# Patient Record
Sex: Male | Born: 1965 | Race: White | Marital: Married | Smoking: Former smoker
Health system: Northeastern US, Academic
[De-identification: ages and names within clinical notes are randomized; demographics above are authoritative.]

## PROBLEM LIST (undated history)

## (undated) DIAGNOSIS — E119 Type 2 diabetes mellitus without complications: Secondary | ICD-10-CM

## (undated) DIAGNOSIS — I10 Essential (primary) hypertension: Secondary | ICD-10-CM

## (undated) DIAGNOSIS — T8859XA Other complications of anesthesia, initial encounter: Secondary | ICD-10-CM

## (undated) DIAGNOSIS — K635 Polyp of colon: Secondary | ICD-10-CM

## (undated) DIAGNOSIS — M1990 Unspecified osteoarthritis, unspecified site: Secondary | ICD-10-CM

## (undated) DIAGNOSIS — R002 Palpitations: Secondary | ICD-10-CM

## (undated) HISTORY — PX: KNEE SURGERY: SHX244

## (undated) HISTORY — PX: JOINT REPLACEMENT: SHX530

## (undated) HISTORY — DX: Palpitations: R00.2

## (undated) HISTORY — PX: TONSILLECTOMY: SHX28A

## (undated) HISTORY — PX: COLONOSCOPY: SHX174

---

## 2007-09-19 DIAGNOSIS — E669 Obesity, unspecified: Secondary | ICD-10-CM | POA: Insufficient documentation

## 2009-10-13 ENCOUNTER — Ambulatory Visit
Admit: 2009-10-13 | Discharge: 2009-10-13 | Disposition: A | Discharge status: Home / Self Care | Payer: Self-pay | Source: Ambulatory Visit | Attending: Sports Medicine | Admitting: Sports Medicine

## 2009-10-18 ENCOUNTER — Ambulatory Visit
Admit: 2009-10-18 | Discharge: 2009-10-18 | Disposition: A | Discharge status: Home / Self Care | Payer: Self-pay | Source: Ambulatory Visit | Attending: Sleep Medicine | Admitting: Sleep Medicine

## 2009-10-24 ENCOUNTER — Ambulatory Visit
Admit: 2009-10-24 | Discharge: 2009-10-24 | Disposition: A | Discharge status: Home / Self Care | Payer: Self-pay | Source: Ambulatory Visit | Attending: Internal Medicine | Admitting: Internal Medicine

## 2009-11-03 ENCOUNTER — Ambulatory Visit: Payer: Self-pay | Admitting: Sports Medicine

## 2009-11-09 ENCOUNTER — Ambulatory Visit
Admit: 2009-11-09 | Discharge: 2009-11-09 | Disposition: A | Discharge status: Home / Self Care | Payer: Self-pay | Source: Ambulatory Visit | Attending: Sports Medicine | Admitting: Sports Medicine

## 2009-11-10 ENCOUNTER — Ambulatory Visit
Admit: 2009-11-10 | Discharge: 2009-11-10 | Disposition: A | Discharge status: Home / Self Care | Payer: Self-pay | Source: Ambulatory Visit | Attending: Sports Medicine | Admitting: Sports Medicine

## 2009-11-10 ENCOUNTER — Encounter: Payer: Self-pay | Admitting: Cardiology

## 2009-11-11 ENCOUNTER — Ambulatory Visit
Admit: 2009-11-11 | Discharge: 2009-11-11 | Disposition: A | Discharge status: Home / Self Care | Payer: Self-pay | Source: Ambulatory Visit | Attending: Sports Medicine | Admitting: Sports Medicine

## 2009-11-17 ENCOUNTER — Ambulatory Visit: Payer: Self-pay | Admitting: Sports Medicine

## 2009-11-28 NOTE — Progress Notes (Signed)
HISTORY OF PRESENTING ILLNESS: Noah Craig is seen in postop follow up from his  right knee arthroscopy with partial meniscectomy and debridement.  He is  recovering well.  He has much less pain than he did preoperatively.  This  is quite encouraging.    PHYSICAL EXAMINATION: On exam he has no effusion and minimal swelling.  He  has extension to 0 and flexion to 120 degrees.  Portals look good. Sutures  were removed.    IMPRESSION: Good initial response.    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  It certainly is encouraging with his initial postop  response but is quite concerning about long-term progression of arthritis.  We discussed ongoing nonoperative measures including the use of  glucosamine, avoiding high-impact loading, and being consistent with  low-impact conditioning and weight control.  I would like to see him back  in a month.  Questions were invited and answered. He is already back at  work.                Electronically Signed and Finalized  by  Kristine Linea, MD 11/28/2009 12:44  ___________________________________________  Kristine Linea, MD      DD:   11/17/2009  DT:   11/18/2009  7:33 A  [id number]  [id number]    cc:   Margy Clarks, MD

## 2009-12-12 ENCOUNTER — Ambulatory Visit: Payer: Self-pay | Admitting: Sports Medicine

## 2009-12-13 NOTE — Progress Notes (Signed)
Alexs is seen in follow-up for his right knee arthroscopy, meniscectomy and  debridement for his grade 4 articular changes.  Overall again the extent of  articular damage to his knees doing reasonably well but definitely still  aches.    PHYSICAL EXAMINATION: On exam he has extension to 0, flexion to 130  degrees.  No effusion.  Good quad recruitment.    IMPRESSION:   Doing reasonably well.    PLAN:   I have encouraged him to continue long-term use of  glucosamine.  I  have reviewed with him very gentle exercise, keeping everything low-impact  and I encouraged an ongoing diet for weight loss, emphasized using  extremely light weights, multiple reps for strength training.   Questions  were invited and answered.    At this point he will follow up on a p.r.n. basis.                Electronically Signed and Finalized  by  Kristine Linea, MD 12/13/2009 08:29  ___________________________________________  Kristine Linea, MD      DD:   12/12/2009  DT:   12/12/2009  4:29 P  [id number]  [id number]    cc:   Margy Clarks, MD

## 2010-05-14 ENCOUNTER — Encounter: Payer: Self-pay | Admitting: Gastroenterology

## 2010-06-27 ENCOUNTER — Ambulatory Visit: Payer: Self-pay | Admitting: Internal Medicine

## 2010-06-27 ENCOUNTER — Ambulatory Visit: Payer: Self-pay | Admitting: Primary Care

## 2010-07-04 ENCOUNTER — Ambulatory Visit: Payer: Self-pay | Admitting: Primary Care

## 2011-01-07 NOTE — Miscellaneous (Unsigned)
 Continuity of Care Record  Created: todo  From: Minta Balsam  From:   From: [employer], EHR [id number]  To: Pennix, Roarke  Purpose: Patient Use;       Problems  Diagnosis: Depression (311)   Diagnosis: Diabetes Mellitus (250.00)   Diagnosis: Hyperlipidemia (272.4)   Diagnosis: Hypoglycemia (251.2)   Diagnosis: Obesity (278.00)   Problem: Rectal Sore With Bleeding  Diagnosis: Visit For: Routine Adult H&amp;P (V70.0)     Alerts  Allergy - Latex-asked/denied   Allergy - No Known Drug Allergy     Medications  Glimepiride 2 MG Tablet; TAKE 1 TABLET DAILY WITH BREAKFAST. ; Rx   MetFORMIN HCl 1000 MG Tablet; TAKE ONE TABLET BY MOUTH TWICE A DAY WITH   MEALS ; Rx   OneTouch Lancets Miscellaneous; UAD Twice daily for diabetes 250 ; Rx   OneTouch Ultra Blue Strip; USE ONE STRIP 4 TIMES DAILY DX: DM 250 ; Rx   OneTouch UltraSmart w/Device Kit; USE AS DIRECTED. ; Rx   OneTouch UltraSoft Lancets Miscellaneous; USE TWICE DAILY AS DIRECTED FOR   DIABETES ; Rx     Immunizations  Tdap (Adacel)   Influenza   Influenza   Pneumo (Pneumovax)   H1N1 Influenza Inj   Influenza (Split)

## 2011-01-09 ENCOUNTER — Encounter: Payer: Self-pay | Admitting: Primary Care

## 2011-01-09 ENCOUNTER — Ambulatory Visit: Payer: Self-pay | Admitting: Primary Care

## 2011-01-09 ENCOUNTER — Ambulatory Visit
Admit: 2011-01-09 | Discharge: 2011-01-09 | Disposition: A | Discharge status: Home / Self Care | Payer: Self-pay | Source: Ambulatory Visit | Attending: Primary Care | Admitting: Primary Care

## 2011-01-09 LAB — COMPREHENSIVE METABOLIC PANEL
ALT: 38 U/L (ref 0–50)
AST: 26 U/L (ref 0–50)
Albumin: 4.7 g/dL (ref 3.5–5.2)
Alk Phos: 92 U/L (ref 40–130)
Anion Gap: 7 (ref 7–16)
Bilirubin,Total: 0.8 mg/dL (ref 0.0–1.2)
CO2: 28 mmol/L (ref 20–28)
Calcium: 9.4 mg/dL (ref 9.0–10.3)
Chloride: 102 mmol/L (ref 96–108)
Creatinine: 1.01 mg/dL (ref 0.67–1.17)
GFR,Black: >59 *
GFR,Caucasian: >59 *
Glucose: 215 mg/dL — ABNORMAL HIGH (ref 74–106)
Lab: 19 mg/dL (ref 6–20)
Potassium: 4.3 mmol/L (ref 3.3–5.1)
Sodium: 137 mmol/L (ref 133–145)
Total Protein: 7.4 g/dL (ref 6.3–7.7)

## 2011-01-09 NOTE — Progress Notes (Signed)
Reason For Visit    FUA/ Previous pt of RP, dm, bw 1/10 also had Hbalc done today not in   results yet.KBozek,MA.  HPI   Diabetes: He takes his medications daily.  He denies any change in her   vision or numbness in the feet.  He does not follow with podiatrist.  He   has seen a ophthalmologist about 1-1/2 years ago.  He denies any   hypoglycemia.     Hyperlipidemia: He took himself off the medications.  He is dieting and   exercising.  He lost 20 pounds.  He denies any chest pain or shortness of   breath.     Obesity: He is dieting and exercising.  He lost pounds.  He denies any   joint pains.     Nonsmoker     Medications and allergies reviewed.  Allergies   Latex-asked/denied  No Known Drug Allergy.  Current Meds   Sertraline HCl 100 MG Tablet;TAKE 2 TABLETS DAILY.; Rx  Aspirin 81 MG Tablet;; RPT  OneTouch UltraSoft Lancets Miscellaneous;USE TWICE DAILY AS DIRECTED FOR   DIABETES; Rx  OneTouch UltraSmart w/Device Kit;USE AS DIRECTED.; Rx  OneTouch Lancets Miscellaneous;UAD Twice daily for diabetes 250; Rx  Vitamin D3 1000 UNIT Capsule;TAKE 1 CAPSULE DAILY; RPT  OneTouch Ultra Blue Strip;USE ONE STRIP 4 TIMES DAILY DX: DM 250; Rx  Glimepiride 2 MG Tablet;TAKE 1/2 TABLET BID **no more until seen**; Rx  MetFORMIN HCl 1000 MG Tablet;TAKE ONE TABLET BY MOUTH TWICE A DAY **no more   refills. needs appt**; Rx  Pravastatin Sodium 20 MG Tablet;TAKE ONE TABLET BY MOUTH AT BEDTIME.; Rx.  Active Problems   Depression (311)  Diabetes Mellitus (250.00)  Hyperlipidemia (272.4)  Hypoglycemia (251.2)  Obesity (278.00)  Rectal Sore With Bleeding  Visit For: Routine Adult HandP (V70.0).  Vital Signs   Recorded by [facility] on 09 Jan 2011 02:28 PM  BP:112/60,   Height: 73 in, Weight: 258 lb, BMI: 34 kg/m2.  Physical Exam   Eye: No pallor or icterus  Neck: No carotid bruit or the lymph node  Heart: S1-S2 normal no murmur detected  Lung: Breath sounds are bilaterally equal  Abdomen: Soft nontender bowel sounds  present  Extremities: No edema  Foot exam: normal sensation and pulsation  Neuro: Nonfocal.  Assessment   Diabetes: he is doing extremely well and has lost 21 pounds.  His   hemoglobin A1c was excellent, awaiting HBA1C from today. continue current   medications. encouraged for annual eye exam. Check urine for   micoralbuminuria.     Hyperlipidemia: he is on pravastatin. he is exercising and losing weight,   repeat lipid profile before next visit     obesity:  encouraged to continue weight loss     F/U in 4 months with blood work.     .  Signature   Electronically signed by: Minta Balsam  M.D.; 01/09/2011 3:53 PM EST.

## 2011-01-10 LAB — HEMOGLOBIN A1C: Hemoglobin A1C: 7.8 % — ABNORMAL HIGH (ref 4.0–6.0)

## 2011-04-05 ENCOUNTER — Ambulatory Visit
Admit: 2011-04-05 | Discharge: 2011-04-05 | Disposition: A | Discharge status: Home / Self Care | Payer: Self-pay | Source: Ambulatory Visit | Attending: Primary Care | Admitting: Primary Care

## 2011-04-06 LAB — HEMOGLOBIN A1C: Hemoglobin A1C: 7.4 % — ABNORMAL HIGH (ref 4.0–6.0)

## 2011-05-11 ENCOUNTER — Ambulatory Visit: Payer: Self-pay | Admitting: Primary Care

## 2011-05-14 ENCOUNTER — Encounter: Payer: Self-pay | Admitting: Primary Care

## 2011-05-14 ENCOUNTER — Ambulatory Visit: Payer: Self-pay | Admitting: Primary Care

## 2011-05-14 DIAGNOSIS — E785 Hyperlipidemia, unspecified: Secondary | ICD-10-CM

## 2011-05-14 DIAGNOSIS — E039 Hypothyroidism, unspecified: Secondary | ICD-10-CM

## 2011-05-14 DIAGNOSIS — E119 Type 2 diabetes mellitus without complications: Secondary | ICD-10-CM | POA: Insufficient documentation

## 2011-05-14 MED ORDER — FLUTICASONE PROPIONATE 50 MCG/ACT NA SUSP *I*
2.0000 | Freq: Every day | NASAL | Status: DC
Start: 2011-05-14 — End: 2011-09-06

## 2011-05-14 NOTE — Progress Notes (Signed)
 Subjective: diabetes     Patient ID: Noah Craig is a 45 y.o. male.    HPI    Diabetes: he is taking his medication daily. He check his sugar at home and the number run in low 100's. No change in vision or numbness in feet. He does not follow with opthalmologist or podiatrist.    Hyperlipidemia: he is not taking any medication. He is following diet and exercise. No muscle ache. No CP or SOB    Obesity: he is dieting, lost some weight.     Allergic rhinitis: he is c/o stuffy nose for 4-5 weeks, no sinus pressure, no ear pain. No cough.     nonsmoker    Patient's medications, allergies, past medical, surgical, social and family histories were reviewed and updated as appropriate.    Review of Systems      per HPI    Objective:   Physical Exam      Gen: NAD  ENT: no sinus tenderness, throat without erythema  Heart: S1S2 normal  Lungs: clear, no crackles  Ext: no edema, feet normal.    Plan:      1 DM: HBA1C above goal, continue meds, repeat HBA1C    2 Hyperlipidemia: Recheck lipid profile.    3 Allergic rhinitis; prescribed flonase, advise to call back in 1 week if no improvement    4 Obesity: encourage diet and exercise to loose wt    5 F/U in 6 months.

## 2011-05-14 NOTE — Patient Instructions (Signed)
 Use nasal spray as directed  Lab work tomorrow after 8 hrs fasting

## 2011-05-15 ENCOUNTER — Ambulatory Visit
Admit: 2011-05-15 | Discharge: 2011-05-15 | Disposition: A | Discharge status: Home / Self Care | Payer: Self-pay | Source: Ambulatory Visit | Attending: Primary Care | Admitting: Primary Care

## 2011-05-15 DIAGNOSIS — E785 Hyperlipidemia, unspecified: Secondary | ICD-10-CM

## 2011-05-15 DIAGNOSIS — E119 Type 2 diabetes mellitus without complications: Secondary | ICD-10-CM

## 2011-05-15 DIAGNOSIS — E039 Hypothyroidism, unspecified: Secondary | ICD-10-CM

## 2011-05-15 LAB — COMPREHENSIVE METABOLIC PANEL
ALT: 51 U/L — ABNORMAL HIGH (ref 0–50)
AST: 27 U/L (ref 0–50)
Albumin: 4.5 g/dL (ref 3.5–5.2)
Alk Phos: 76 U/L (ref 40–130)
Anion Gap: 10 (ref 7–16)
Bilirubin,Total: 0.6 mg/dL (ref 0.0–1.2)
CO2: 24 mmol/L (ref 20–28)
Calcium: 9.4 mg/dL (ref 9.0–10.3)
Chloride: 106 mmol/L (ref 96–108)
Creatinine: 0.97 mg/dL (ref 0.67–1.17)
GFR,Black: >59 *
GFR,Caucasian: >59 *
Glucose: 168 mg/dL — ABNORMAL HIGH (ref 74–106)
Lab: 20 mg/dL (ref 6–20)
Potassium: 4.2 mmol/L (ref 3.3–5.1)
Sodium: 140 mmol/L (ref 133–145)
Total Protein: 7.1 g/dL (ref 6.3–7.7)

## 2011-05-15 LAB — TSH: TSH: 2.59 u[IU]/mL (ref 0.27–4.20)

## 2011-05-15 LAB — LIPID PANEL
Chol/HDL Ratio: 2.7
Cholesterol: 95 mg/dL
HDL: 35 mg/dL
LDL Calculated: 48 mg/dL
Non HDL Cholesterol: 60 mg/dL
Triglycerides: 62 mg/dL

## 2011-05-15 LAB — MICROALBUMIN, URINE, RANDOM
Creatinine,UR: 168 mg/dL (ref 20–300)
Microalb/Creat Ratio: 4.9 mg MA/g CR (ref 0.0–29.9)
Microalbumin,UR: 0.82 mg/dL

## 2011-05-16 LAB — HEMOGLOBIN A1C: Hemoglobin A1C: 6.5 % — ABNORMAL HIGH (ref 4.0–6.0)

## 2011-07-10 ENCOUNTER — Other Ambulatory Visit: Payer: Self-pay | Admitting: Primary Care

## 2011-07-10 MED ORDER — GLUCOSE BLOOD VI STRP *A*
ORAL_STRIP | Status: DC
Start: 2011-07-10 — End: 2011-08-23

## 2011-07-10 MED ORDER — METFORMIN HCL 1000 MG PO TABS *I*
ORAL_TABLET | ORAL | Status: DC
Start: 2011-07-10 — End: 2011-12-11

## 2011-07-10 MED ORDER — GLIMEPIRIDE 2 MG PO TABS *I*
ORAL_TABLET | ORAL | Status: DC
Start: 2011-07-10 — End: 2011-11-12

## 2011-07-10 MED ORDER — ONETOUCH ULTRASOFT LANCETS MISC *A*
Status: DC
Start: 2011-07-10 — End: 2011-08-23

## 2011-08-02 ENCOUNTER — Emergency Department
Admission: EM | Admit: 2011-08-02 | Disposition: A | Discharge status: Home / Self Care | Payer: Self-pay | Source: Ambulatory Visit | Attending: Emergency Medicine | Admitting: Emergency Medicine

## 2011-08-02 ENCOUNTER — Encounter: Payer: Self-pay | Admitting: Emergency Medicine

## 2011-08-02 HISTORY — DX: Type 2 diabetes mellitus without complications: E11.9

## 2011-08-02 LAB — CBC AND DIFFERENTIAL
Baso # K/uL: 0 10*3/uL (ref 0.0–0.1)
Basophil %: 0.2 % (ref 0.2–1.2)
Eos # K/uL: 0.1 10*3/uL (ref 0.0–0.5)
Eosinophil %: 0.9 % (ref 0.8–7.0)
Hematocrit: 43 % (ref 40–51)
Hemoglobin: 15.7 g/dL (ref 13.7–17.5)
Lymph # K/uL: 2.1 10*3/uL (ref 1.3–3.6)
Lymphocyte %: 21.1 % — ABNORMAL LOW (ref 21.8–53.1)
MCV: 86 fL (ref 79–92)
Mono # K/uL: 0.5 10*3/uL (ref 0.3–0.8)
Monocyte %: 5 % — ABNORMAL LOW (ref 5.3–12.2)
Neut # K/uL: 7.1 10*3/uL — ABNORMAL HIGH (ref 1.8–5.4)
Platelets: 178 10*3/uL (ref 150–330)
RBC: 5.1 MIL/uL (ref 4.6–6.1)
RDW: 13.1 % (ref 11.6–14.4)
Seg Neut %: 72.8 % — ABNORMAL HIGH (ref 34.0–67.9)
WBC: 9.7 10*3/uL — ABNORMAL HIGH (ref 4.2–9.1)

## 2011-08-02 LAB — URINALYSIS WITH MICROSCOPIC
Blood,UA: NEGATIVE
Ketones, UA: NEGATIVE
Leuk Esterase,UA: NEGATIVE
Nitrite,UA: NEGATIVE
Protein,UA: NEGATIVE mg/dL
RBC,UA: NONE SEEN /hpf (ref 0–2)
Specific Gravity,UA: >1.06 — ABNORMAL HIGH (ref 1.002–1.030)
WBC,UA: NONE SEEN /hpf (ref 0–5)
pH,UA: 5 (ref 5.0–8.0)

## 2011-08-02 LAB — PLASMA PROF 7 (ED ONLY)
Anion Gap,PL: 11 (ref 7–16)
CO2,Plasma: 23 mmol/L (ref 20–28)
Chloride,Plasma: 107 mmol/L (ref 96–108)
Creatinine: 1.04 mg/dL (ref 0.67–1.17)
GFR,Black: >59 *
GFR,Caucasian: >59 *
Glucose,Plasma: 105 mg/dL — ABNORMAL HIGH (ref 60–99)
Potassium,Plasma: 3.8 mmol/L (ref 3.4–4.7)
Sodium,Plasma: 141 mmol/L (ref 132–146)
UN,Plasma: 14 mg/dL (ref 6–20)

## 2011-08-02 LAB — RUQ PANEL (ED ONLY)
ALT: 44 U/L (ref 0–50)
AST: 27 U/L (ref 0–50)
Albumin: 4.8 g/dL (ref 3.5–5.2)
Alk Phos: 68 U/L (ref 40–130)
Amylase: 72 U/L (ref 28–100)
Bilirubin,Direct: 0.2 mg/dL (ref 0.0–0.3)
Bilirubin,Total: 0.6 mg/dL (ref 0.0–1.2)
Lipase: 24 U/L (ref 13–60)
Total Protein: 7.3 g/dL (ref 6.3–7.7)

## 2011-08-02 LAB — BLOOD BANK HOLD RED

## 2011-08-02 LAB — HOLD BLUE

## 2011-08-02 LAB — BLOOD BANK HOLD LAVENDER

## 2011-08-02 MED ORDER — ONDANSETRON 4 MG PO TBDP *I*
4.0000 mg | ORAL_TABLET | Freq: Once | ORAL | Status: AC
Start: 2011-08-02 — End: 2011-08-02
  Administered 2011-08-02: 4 mg via ORAL
  Filled 2011-08-02: qty 1

## 2011-08-02 MED ORDER — HYDROCODONE-ACETAMINOPHEN 5-500 MG PO TABS
1.0000 | ORAL_TABLET | ORAL | Status: DC | PRN
Start: 2011-08-02 — End: 2011-09-06

## 2011-08-02 MED ORDER — OXYCODONE-ACETAMINOPHEN 5-325 MG PO TABS *I*
2.0000 | ORAL_TABLET | Freq: Once | ORAL | Status: AC
Start: 2011-08-02 — End: 2011-08-02
  Administered 2011-08-02: 2 via ORAL
  Filled 2011-08-02: qty 2

## 2011-08-02 NOTE — ED Notes (Signed)
Pt to CT

## 2011-08-02 NOTE — ED Provider Progress Notes (Signed)
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

## 2011-08-02 NOTE — ED Notes (Addendum)
Struck  To left umbilicus with a bar on a trailer gate 2 hours ago. Reports nausea and pain 2/10. Area slightly erythematous. Hx DM2 Bobbe Medico, RN

## 2011-08-02 NOTE — Discharge Instructions (Addendum)
Discharge Instructions for Patients receiving Contrast Medium    08/02/2011  4:56 PM    INFORMATION  I.V. contrast is eliminated through the kidneys.  Oral and rectal contrasts are not absorbed by the body and will be eliminated through the gastrointestinal tract.  Side effects and allergic reactions to contrast mediums are not common but can occur up to 48 hours after your scan.    INSTRUCTIONS   1. You will need to drink four 8 oz glasses of fluid over the next four hours, unless your medical condition does not allow you to do this.  Please speak with an [facility] nurse if you have any questions or concerns about this.  2. Mild headache may occur following contrast injection.  3. The kidneys excrete the contrast.  Your urine will NOT become discolored.  4. You may experience loose stools/diarrhea for approximately 24 hours after drinking oral contrast.  5. You may experience watery stool immediately after rectal contrast.  6.  If you are a DIABETIC and take Actos Plus Met, Actos Plus Met XR, Avandamet, Foramet, Glucophage, Glucophage XR, Glucovance, Glumetza, Janumet, Janumet XR, Jentadueto, Kombiglyze XR, Metaglip, PrandiMet, Riomet or Metformin, hold this medication for 48 hours (2 days) after your exam.  Check with your doctor for management of your diabetes during this time and before restarting these medications.  Your doctor will have to address the need for kidney function blood tests before restarting your diabetes medication.      7.   There is no evidence that the contrast you have received would be harmful to your             breastfed child.  If you choose, you may pump and discard your breast milk for the            next 24 hours.    Delayed effects from contrast are not common.  However, notify your doctor IMMEDIATELY:    If nausea or vomiting occurs   If any itching, hives, wheezing, or rashes occur   Severe or throbbing headache    Call 911 or go to the nearest Emergency Department if you  have a medical emergency.    If you have questions regarding your procedure, please call [facility] [phone] during business hours Monday - Friday 8am-5pm  to speak to an Sales executive.  After 5pm and weekends, call [phone] and ask to speak with the Radiology resident on call.  The Emergency Department is open 24 hours a day at [facility] and [facility] if emergency treatment is required  I have read and understand the above instructions and I have received a copy.  -------------------------------------------------------------------------------------------------------------    You have been given a prescription for Vicodin- take as prescribed as needed for pain. Do not drink alcohol, drive or take in combination with tylenol    You may also take ibuprofen 600mg  every 6 hours as needed for less severe pain    Do not take metformin x 48 hours, drink plenty of fluids    Rest and ice area    Return to the ED with new, worsening or concerning symptoms

## 2011-08-02 NOTE — ED Notes (Signed)
Writer assuming care. Pt a and Ox4. VSS. Family @ bedside. Will continue to tx and monitor pt per orders

## 2011-08-02 NOTE — ED Notes (Signed)
Pt states he is unable to give a urine sample at this time. Bobbe Medico, RN

## 2011-08-02 NOTE — ED Provider Notes (Addendum)
History     Chief Complaint   Patient presents with   . Abdominal Injury     HPI Comments: 45 year old male with PMH DM who presents to the ED with c/o LUQ abdominal pain.  Patient was pulling on metal bar in attempt to unhinge truck door and bar came back and hit him in LUQ of abdomen.  Incident occurred around 1pm, and he instantly experienced pain and nausea.  Pain worse with movement, palpation and deep breaths. Denies further injury, head trauma, LOC, chest pain, SOB. Has not had to urinate since incident occurred.  Currently with abdominal pain he rates 2/10 and mild nausea.     The history is provided by the patient. No language interpreter was used.     He does not have any associated CP  Past Medical History   Diagnosis Date   . Diabetes mellitus        Past Surgical History   Procedure Date   . Knee surgery        No family history on file.    Social History      reports that he has quit smoking. He does not have any smokeless tobacco history on file. He reports that he drinks alcohol. He reports that he does not use illicit drugs. His sexual activity history not on file.    Living Situation     Questions Responses    Patient lives with Significant Other    Homeless No    Caregiver for other family member     External Services     Employment Employed    Domestic Violence Risk           Review of Systems   Review of Systems   Constitutional: Negative for fever and chills.   HENT: Negative for congestion and rhinorrhea.    Eyes: Negative for visual disturbance.   Respiratory: Negative for cough and shortness of breath.    Cardiovascular: Negative for chest pain, palpitations and leg swelling.   Gastrointestinal: Positive for nausea and abdominal pain. Negative for vomiting, diarrhea and blood in stool.   Genitourinary: Negative for dysuria, flank pain and difficulty urinating.   Musculoskeletal: Negative for back pain and gait problem.   Skin: Positive for rash (erythema LUQ abdomen).   Neurological:  Negative for dizziness, weakness, light-headedness, numbness and headaches.   Psychiatric/Behavioral: Negative for behavioral problems, confusion and agitation.       Physical Exam   BP 122/78  Pulse 78  Temp 36 C (96.8 F)  Resp 16  SpO2 98%    Physical Exam   Nursing note and vitals reviewed.  Constitutional: He is oriented to person, place, and time. He appears well-developed and well-nourished. No distress.   HENT:   Head: Normocephalic and atraumatic.   Right Ear: External ear normal.   Left Ear: External ear normal.   Nose: Nose normal.   Mouth/Throat: Uvula is midline and mucous membranes are normal.   Eyes: EOM are normal. Pupils are equal, round, and reactive to light. No scleral icterus.   Neck: Normal range of motion. Neck supple. No spinous process tenderness and no muscular tenderness present. Normal range of motion present.   Cardiovascular: Normal rate, regular rhythm, normal heart sounds and intact distal pulses.  Exam reveals no gallop and no friction rub.    No murmur heard.  Pulmonary/Chest: Effort normal and breath sounds normal. No respiratory distress. He has no wheezes. He has no rales. He exhibits  no tenderness.        No chest wall crepitus   Abdominal: Soft. Bowel sounds are normal. He exhibits no distension and no mass. There is no splenomegaly or hepatomegaly. There is tenderness in the epigastric area and left upper quadrant. There is no rigidity, no rebound, no guarding and no CVA tenderness.       Musculoskeletal: Normal range of motion. He exhibits no edema.   Neurological: He is alert and oriented to person, place, and time. No cranial nerve deficit.   Skin: Skin is warm and dry. He is not diaphoretic.   Psychiatric: He has a normal mood and affect. His behavior is normal. Judgment and thought content normal.       Medical Decision Making   MDM  Number of Diagnoses or Management Options  Contusion:   Diagnosis management comments: Patient seen by me today, 08/02/2011 at 3:15  PM    Assessment:  45 y.o. male with PMH DM who comes to the ED with LUQ abdominal pain  Differential Diagnosis includes contusion, abdominal wall injury, intra-abdominal injury, anemia  Plan: analgesia  Labs, u/a  CT abdomen/pelvis  Reassess- patient asymptomatic and denies pain and nausea  D/C home  Rx vicodin given prn pain  Rest/ice  Drink plenty of fluids, mold metformin x48 hours  F/u primary MD  Return to ED with new, worsening or concerning symptoms    Supervising physician Dr. [name] was immediately available.      I was also concerned about possible splenic injury  [name], PA  Patient seen by me today, 08/02/2011 at 1700    History:   I reviewed this patient, reviewed the APP note and agree, with edits as above  Exam:   I examined this patient, reviewed the APP note and agree, with edits as above    Decision Making:   I discussed with the documented APP decision making  and agree, with edits as above          Author [name], MD      [name], MD  08/02/11 2039

## 2011-08-02 NOTE — ED Notes (Signed)
Hit LUQ with bar while at work  +n-v  3" red mark to abd  Denies SOB

## 2011-08-09 ENCOUNTER — Ambulatory Visit: Payer: Self-pay | Admitting: Primary Care

## 2011-08-16 ENCOUNTER — Ambulatory Visit
Admit: 2011-08-16 | Discharge: 2011-08-16 | Disposition: A | Discharge status: Home / Self Care | Payer: Self-pay | Source: Ambulatory Visit | Attending: Family | Admitting: Family

## 2011-08-22 ENCOUNTER — Ambulatory Visit: Admit: 2011-08-22 | Payer: Self-pay | Source: Ambulatory Visit | Admitting: Family

## 2011-08-23 ENCOUNTER — Other Ambulatory Visit: Payer: Self-pay | Admitting: Primary Care

## 2011-08-23 MED ORDER — ONETOUCH ULTRASOFT LANCETS MISC *A*
Status: DC
Start: 2011-08-23 — End: 2012-01-14

## 2011-08-23 MED ORDER — GLUCOSE BLOOD VI STRP *A*
ORAL_STRIP | Status: DC
Start: 2011-08-23 — End: 2018-02-25

## 2011-08-23 NOTE — Telephone Encounter (Signed)
Pt is calling requesting test strips 100 per month per new insurance. Directions need to say test 3 to 4 times per day to allow for the 100 strips. Pt is also asking for the same thing for lancets--100 per month and test 3-4 times per day.

## 2011-08-30 ENCOUNTER — Ambulatory Visit
Admit: 2011-08-30 | Discharge: 2011-08-30 | Disposition: A | Discharge status: Home / Self Care | Payer: Self-pay | Source: Ambulatory Visit | Attending: Family | Admitting: Family

## 2011-09-06 ENCOUNTER — Ambulatory Visit: Payer: Self-pay | Admitting: Primary Care

## 2011-09-06 ENCOUNTER — Encounter: Payer: Self-pay | Admitting: Primary Care

## 2011-09-06 ENCOUNTER — Ambulatory Visit
Admit: 2011-09-06 | Discharge: 2011-09-06 | Disposition: A | Discharge status: Home / Self Care | Payer: Self-pay | Source: Ambulatory Visit | Attending: Family | Admitting: Family

## 2011-09-06 ENCOUNTER — Ambulatory Visit
Admit: 2011-09-06 | Discharge: 2011-09-06 | Disposition: A | Discharge status: Home / Self Care | Payer: Self-pay | Source: Ambulatory Visit | Attending: Primary Care | Admitting: Primary Care

## 2011-09-06 VITALS — BP 120/78 | HR 80 | Ht 74.0 in | Wt 275.0 lb

## 2011-09-06 DIAGNOSIS — K5900 Constipation, unspecified: Secondary | ICD-10-CM | POA: Insufficient documentation

## 2011-09-06 NOTE — Progress Notes (Signed)
Subjective: constipation     Patient ID: Noah Craig is a 45 y.o. male    HPI     Constipation: he is here for evaluation. C/O constipation on and off but worse for last 6 days. It is associated with abdominal pain which is 5/10 and dull. No radiation of pain. He did not take any medication for constipation. No N&V. No distention. He denies any change in medication.    nonsmoker    Patient's medications, allergies, past medical, surgical, social and family histories were reviewed and updated as appropriate.    Review of Systems      per HPI    Objective:   Physical Exam      abdomen: soft, NT, BS+     Plan:      Constipation; likely acute on chronic, pt was on pain medication due to recent injury. Will obtain Xray of abdomen to evaluate for stool content. Advise high fibre diet. Further management based on Xray finding. Pt reassured.

## 2011-09-07 ENCOUNTER — Telehealth: Payer: Self-pay | Admitting: Primary Care

## 2011-09-07 NOTE — Telephone Encounter (Signed)
Message copied by Duane Lope on Fri Sep 07, 2011  4:24 PM  ------       Message from: Minta Balsam       Created: Fri Sep 07, 2011  1:32 PM         Reviewed Xray, left message to call back. Let him know that it showed lot of stool in colon. Advise Senna 2 tab at night, colace 100 mg capsule 2 at night and magnesium citrate 150 ml daily for next 3 days. All these are available OTC. Call back on Monday as how is he doing. thanks

## 2011-09-07 NOTE — Telephone Encounter (Signed)
Dr. Noah Charon spoke to patient.  All set.

## 2011-09-10 ENCOUNTER — Telehealth: Payer: Self-pay | Admitting: Primary Care

## 2011-09-10 NOTE — Telephone Encounter (Signed)
Seen last week. Advised to call today.  Took magnesium sitrate (purchased 10 oz bottle).  Took 5 oz, which did nothing.  Took the rest later & started to work.  Do you want him to do 5 oz or the full bottle?  How many day should he do this?  (never did the colon cleansing this wk end.  Will do tonight)

## 2011-09-10 NOTE — Telephone Encounter (Signed)
Called patient.  He will do.

## 2011-09-10 NOTE — Telephone Encounter (Signed)
Take 10 Oz daily for 3 days and call back to make sure it is all cleaned out.

## 2011-09-14 ENCOUNTER — Telehealth: Payer: Self-pay | Admitting: Primary Care

## 2011-09-14 NOTE — Telephone Encounter (Signed)
Advised to call today on progress.  He's tired, was taking magnesium titrate, which causes diarrhea, but "didn't really clean him out".  Feels it was not effective.  What do you suggest?     Was hit in the abd with a piece of equipment at work 8/2nd.  Do you feel this could be the issue?  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  Please advise.

## 2011-09-14 NOTE — Telephone Encounter (Signed)
Talked to patient on the phone, advise continue colace and senna. Repeat Xray on Monday and go from there.

## 2011-09-17 ENCOUNTER — Ambulatory Visit
Admit: 2011-09-17 | Discharge: 2011-09-17 | Disposition: A | Discharge status: Home / Self Care | Payer: Self-pay | Source: Ambulatory Visit | Attending: Primary Care | Admitting: Primary Care

## 2011-09-17 DIAGNOSIS — K5900 Constipation, unspecified: Secondary | ICD-10-CM

## 2011-09-18 ENCOUNTER — Telehealth: Payer: Self-pay | Admitting: Primary Care

## 2011-09-18 NOTE — Telephone Encounter (Signed)
Message copied by Duane Lope on Tue Sep 18, 2011 10:27 AM  ------       Message from: Minta Balsam       Created: Tue Sep 18, 2011  9:28 AM         Xray showed still some stool, better than before. Advise to continue colace and Senna. Start Miralax 17 gm daily, he can get that over the counter. Advise him to follow up with me in 1 week. thanks

## 2011-09-18 NOTE — Telephone Encounter (Signed)
Spoke to patient and scheduled him for Monday

## 2011-09-24 ENCOUNTER — Encounter: Payer: Self-pay | Admitting: Primary Care

## 2011-09-24 ENCOUNTER — Ambulatory Visit: Payer: Self-pay | Admitting: Primary Care

## 2011-09-24 VITALS — BP 130/70 | HR 78 | Ht 74.0 in | Wt 275.0 lb

## 2011-09-24 DIAGNOSIS — E119 Type 2 diabetes mellitus without complications: Secondary | ICD-10-CM

## 2011-09-24 DIAGNOSIS — Z23 Encounter for immunization: Secondary | ICD-10-CM

## 2011-09-24 DIAGNOSIS — K5900 Constipation, unspecified: Secondary | ICD-10-CM

## 2011-09-24 NOTE — Progress Notes (Signed)
Subjective: constipation     Patient ID: Noah Craig is a 45 y.o. male    HPI     Constipation: he is here for follow up.  He is taking his medications daily.  He is moving formed bowel movements every day.  He feel much improved.  Denies any nausea vomiting.  No abdominal cramping.      nonsmoker    Patient's medications, allergies, past medical, surgical, social and family histories were reviewed and updated as appropriate.    Review of Systems      per HPI    Objective:   Physical Exam      abdomen: soft, NT, BS+     Plan:      Constipation; improving, free air series reviewed with the patient with improvement in stool content in his colon.  Patient is advised to continue senna, Colace and MiraLAX for other week.  He is advised to stop Colace followed by MiraLAX and followed by senna if no recurrence of constipation.  He is advised to start taking fiber supplements every day.  Patient is advised to call if there is any recurrence of constipation.

## 2011-10-03 ENCOUNTER — Ambulatory Visit
Admit: 2011-10-03 | Discharge: 2011-10-03 | Disposition: A | Discharge status: Home / Self Care | Payer: Self-pay | Source: Ambulatory Visit | Attending: Primary Care | Admitting: Primary Care

## 2011-10-03 DIAGNOSIS — E119 Type 2 diabetes mellitus without complications: Secondary | ICD-10-CM

## 2011-10-04 LAB — HEMOGLOBIN A1C: Hemoglobin A1C: 6.3 % — ABNORMAL HIGH (ref 4.0–6.0)

## 2011-10-08 ENCOUNTER — Ambulatory Visit
Admit: 2011-10-08 | Discharge: 2011-10-08 | Disposition: A | Discharge status: Home / Self Care | Payer: Self-pay | Source: Ambulatory Visit | Attending: Family | Admitting: Family

## 2011-10-10 ENCOUNTER — Encounter: Payer: Self-pay | Admitting: Primary Care

## 2011-10-10 ENCOUNTER — Ambulatory Visit: Payer: Self-pay | Admitting: Primary Care

## 2011-10-10 VITALS — BP 140/80 | HR 80 | Ht 73.0 in | Wt 274.0 lb

## 2011-10-10 DIAGNOSIS — E119 Type 2 diabetes mellitus without complications: Secondary | ICD-10-CM

## 2011-10-10 NOTE — Progress Notes (Signed)
Subjective: diabetes     Patient ID: Samrath Pelt is a 45 y.o. male.    HPI    Diabetes; he is here for follow up. 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 He is taking his medication. No hypoglycemia. No change in vision or numbness in feet.    Non smoker    Patient's medications, allergies, past medical, surgical, social and family histories were reviewed and updated as appropriate.    Review of Systems      per hPI    Objective:   Physical Exam     Gen: NAD  Heart: S1S2 normal  Lungs: clear  Ext; no edema    Plan:      Diabetes: well controlled, her HBA1C is at goal of 6.3. Pt is reassured that his diabetes is extremely well controlled and no value of checking urine for glucose. He is going to talk to DOT.    F/U in 4 months.

## 2011-10-15 ENCOUNTER — Telehealth: Payer: Self-pay | Admitting: Primary Care

## 2011-10-15 NOTE — Telephone Encounter (Signed)
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 He saw you last week for office visit with lab results and you told him is sugars were under control. Pt needs a letter from you stating that his sugars are under control. Please fax to [phone] attn:Beth, office at [facility] where they do DOT physicals. Pt needs this to get his license, so please do as soon as possible.

## 2011-10-15 NOTE — Telephone Encounter (Signed)
Letter typed, please review and send.  Thanks.

## 2011-10-15 NOTE — Telephone Encounter (Signed)
Trish, Please write letter as discussed. Thanks so much.

## 2011-10-16 NOTE — Telephone Encounter (Signed)
Trish, Is it all set

## 2011-10-16 NOTE — Telephone Encounter (Signed)
DONE and faxed.  td

## 2011-10-21 LAB — HM COLONOSCOPY

## 2011-11-07 ENCOUNTER — Encounter: Payer: Self-pay | Admitting: Primary Care

## 2011-11-07 ENCOUNTER — Ambulatory Visit: Payer: Self-pay | Admitting: Primary Care

## 2011-11-07 VITALS — BP 130/90 | HR 88 | Ht 73.0 in | Wt 277.0 lb

## 2011-11-07 DIAGNOSIS — E119 Type 2 diabetes mellitus without complications: Secondary | ICD-10-CM

## 2011-11-07 DIAGNOSIS — R5383 Other fatigue: Secondary | ICD-10-CM

## 2011-11-07 LAB — URINALYSIS WITH MICROSCOPIC
Blood,UA: NEGATIVE
Glucose,UA: >=500 mg/dL
Ketones, UA: NEGATIVE
Leuk Esterase,UA: NEGATIVE
Nitrite,UA: NEGATIVE
Protein,UA: NEGATIVE mg/dL
RBC,UA: <1 /hpf (ref 0–2)
Specific Gravity,UA: 1.026 (ref 1.002–1.030)
WBC,UA: NONE SEEN /hpf (ref 0–5)
pH,UA: 6 (ref 5.0–8.0)

## 2011-11-07 LAB — COMPREHENSIVE METABOLIC PANEL
ALT: 72 U/L — ABNORMAL HIGH (ref 0–50)
AST: 29 U/L (ref 0–50)
Albumin: 4.9 g/dL (ref 3.5–5.2)
Alk Phos: 94 U/L (ref 40–130)
Anion Gap: 10 (ref 7–16)
Bilirubin,Total: 0.6 mg/dL (ref 0.0–1.2)
CO2: 28 mmol/L (ref 20–28)
Calcium: 9.7 mg/dL (ref 9.0–10.3)
Chloride: 99 mmol/L (ref 96–108)
Creatinine: 1.03 mg/dL (ref 0.67–1.17)
GFR,Black: >59 *
GFR,Caucasian: >59 *
Glucose: 249 mg/dL — ABNORMAL HIGH (ref 60–99)
Lab: 17 mg/dL (ref 6–20)
Potassium: 4.3 mmol/L (ref 3.3–5.1)
Sodium: 137 mmol/L (ref 133–145)
Total Protein: 7.9 g/dL — ABNORMAL HIGH (ref 6.3–7.7)

## 2011-11-07 LAB — CBC
Hematocrit: 47 % (ref 40–51)
Hemoglobin: 17.3 g/dL (ref 13.7–17.5)
MCV: 86 fL (ref 79–92)
Platelets: 189 10*3/uL (ref 150–330)
RBC: 5.5 MIL/uL (ref 4.6–6.1)
RDW: 13 % (ref 11.6–14.4)
WBC: 8.8 10*3/uL (ref 4.2–9.1)

## 2011-11-07 LAB — TSH: TSH: 3.25 u[IU]/mL (ref 0.27–4.20)

## 2011-11-07 LAB — T4, FREE: Free T4: 1.1 ng/dL (ref 0.9–1.7)

## 2011-11-07 MED ORDER — DOXYCYCLINE HYCLATE 100 MG PO CAPS *I*
100.0000 mg | ORAL_CAPSULE | Freq: Two times a day (BID) | ORAL | Status: DC
Start: 2011-11-07 — End: 2011-12-19

## 2011-11-07 NOTE — Progress Notes (Signed)
Subjective: fatigue     Patient ID: Noah Craig is a 45 y.o. male.    HPI: He is accompanied by his fiancee, Fannie Knee    Fatigue: He is here for evaluation.  He complain of some increasing fatigue since Sunday afternoon.  it is sudden in onset and progressive.  There is no aggravating or relieving factors.  It is associated with sudden increase in his blood sugar.  He denies any fever, cold, chest pain or shortness of breath.  No cough.  No nausea vomiting diarrhea.  No muscle body aches.  He is taking his medications daily.  He is following his diet.  He complain of some rash in his groin.  He think he is feeling well better today.    Groin rash: Has been going on for a few days and it is getting worse.  He denies any itching or pain.    Nonsmoker     Patient's medications, allergies, past medical, surgical, social and family histories were reviewed and updated as appropriate.    Review of Systems       per HPI     Objective:   Physical Exam      Eye: No pallor or icterus  Heart: S1-S2 normal no murmur detected  Lung: Breath sounds are bilaterally equal  Abdomen: Soft nontender bowel sounds present  Extremities: No edema  Groin: likely folliculitis   Neuro: Nonfocal     Plan:      Fatigue: Associated with uncontrolled hyperglycemia.  Likely source is some kind of viral syndrome.  Cannot rule out folliculitis in groin area.  Patient is advised to continue his diabetes medications.  We'll start him on doxycycline for folliculitis.  We will check his CBC, SMA 8, liver function tests, TSH and urine test to rule out any underlying infection or diabetic complications.  Further workup based on lab results.  Patient is advised to call back if continued to have elevated sugars.    Patient is advised to stay off work for next 2 days and maybe return to work on Monday feeling better.    Followup as scheduled

## 2011-11-08 LAB — MICROALBUMIN, URINE, RANDOM
Creatinine,UR: 63 mg/dL (ref 20–300)
Microalb/Creat Ratio: 11.1 mg MA/g CR (ref 0.0–29.9)
Microalbumin,UR: 0.7 mg/dL

## 2011-11-08 LAB — HEMOGLOBIN A1C: Hemoglobin A1C: 6.7 % — ABNORMAL HIGH (ref 4.0–6.0)

## 2011-11-09 ENCOUNTER — Telehealth: Payer: Self-pay | Admitting: Primary Care

## 2011-11-09 NOTE — Telephone Encounter (Signed)
Message copied by Duane Lope on Fri Nov 09, 2011 10:49 AM  ------       Message from: Minta Balsam       Created: Thu Nov 08, 2011 11:37 PM         Let him know that [facility] is 6.7 but other labs and urine test are all normal. How is feeling. thanks

## 2011-11-09 NOTE — Telephone Encounter (Signed)
Pt called back --  BG this am was 165, & 2hrs after he ate this afternoon BG 373.  Feels sluggish, fatigued.

## 2011-11-09 NOTE — Telephone Encounter (Signed)
Talked to patient, advise to take glimepiride 2 mg BID for next 2- 3 days. Advise to call back on Monday or call earlier if there is any problem and call answering service. Reassured that labs look ok and hopefully he will feel better soon and likely viral syndrome.

## 2011-11-09 NOTE — Telephone Encounter (Signed)
I spoke to patient, he still doesn't feel good, very tired, fatigued.  Wakes up in the am 160-170 fasting BG,  As soon as he eats it goes up over 300 2 hours after he eats.

## 2011-11-12 ENCOUNTER — Telehealth: Payer: Self-pay | Admitting: Primary Care

## 2011-11-12 ENCOUNTER — Encounter: Payer: Self-pay | Admitting: Primary Care

## 2011-11-12 DIAGNOSIS — R739 Hyperglycemia, unspecified: Secondary | ICD-10-CM

## 2011-11-12 MED ORDER — GLIPIZIDE 10 MG PO TABS *I*
10.0000 mg | ORAL_TABLET | Freq: Two times a day (BID) | ORAL | Status: DC
Start: 2011-11-12 — End: 2012-01-14

## 2011-11-12 NOTE — Telephone Encounter (Signed)
Talked to him on phone, still feel weak. No fever, cough, CP, SOB, abdominal pain. He is eating ok. Advise to stop glimepiride and start glipizide 10 mg BID. Check labs on Wednesday, order sent and follow up on Thursday. He is unable to work, please write a letter saying unable to work due to medical condition until further notice. thanks

## 2011-11-12 NOTE — Telephone Encounter (Signed)
Letter typed, just need to call and find out employer fax#.

## 2011-11-12 NOTE — Telephone Encounter (Signed)
His BG this am at 8:30 am fasting was 197.  Not feeling great, tired, can't explain it.  He said no improvement over the weekend at all.

## 2011-11-13 ENCOUNTER — Ambulatory Visit: Payer: Self-pay | Admitting: Primary Care

## 2011-11-13 ENCOUNTER — Telehealth: Payer: Self-pay | Admitting: Primary Care

## 2011-11-13 NOTE — Telephone Encounter (Signed)
Spoke to patient and per Dr. Noah Charon, scheduled appt. For him to be seen here on Thursday 11/15/11.  He is having labs done tomorrow.

## 2011-11-13 NOTE — Telephone Encounter (Signed)
Patient stopped in and left a note about his bg's this am.  He was fasting at 9am and it was 183.  After breakfast he was 326 at 11:45am.

## 2011-11-13 NOTE — Telephone Encounter (Signed)
Spoke to Cully, he will come pick up letter.  Work does not have a fax.  Letter placed up front

## 2011-11-14 ENCOUNTER — Ambulatory Visit
Admit: 2011-11-14 | Discharge: 2011-11-14 | Disposition: A | Discharge status: Home / Self Care | Payer: Self-pay | Source: Ambulatory Visit | Attending: Primary Care | Admitting: Primary Care

## 2011-11-14 DIAGNOSIS — R739 Hyperglycemia, unspecified: Secondary | ICD-10-CM

## 2011-11-14 LAB — COMPREHENSIVE METABOLIC PANEL
ALT: 68 U/L — ABNORMAL HIGH (ref 0–50)
AST: 34 U/L (ref 0–50)
Albumin: 4.9 g/dL (ref 3.5–5.2)
Alk Phos: 81 U/L (ref 40–130)
Anion Gap: 11 (ref 7–16)
Bilirubin,Total: 0.7 mg/dL (ref 0.0–1.2)
CO2: 28 mmol/L (ref 20–28)
Calcium: 10 mg/dL (ref 9.0–10.3)
Chloride: 101 mmol/L (ref 96–108)
Creatinine: 1.1 mg/dL (ref 0.67–1.17)
GFR,Black: >59 *
GFR,Caucasian: >59 *
Glucose: 180 mg/dL — ABNORMAL HIGH (ref 60–99)
Lab: 19 mg/dL (ref 6–20)
Potassium: 4.4 mmol/L (ref 3.3–5.1)
Sodium: 140 mmol/L (ref 133–145)
Total Protein: 7.7 g/dL (ref 6.3–7.7)

## 2011-11-14 LAB — CBC AND DIFFERENTIAL
Baso # K/uL: 0 10*3/uL (ref 0.0–0.1)
Basophil %: 0.2 % (ref 0.2–1.2)
Eos # K/uL: 0.2 10*3/uL (ref 0.0–0.5)
Eosinophil %: 1.8 % (ref 0.8–7.0)
Hematocrit: 46 % (ref 40–51)
Hemoglobin: 16.7 g/dL (ref 13.7–17.5)
Lymph # K/uL: 2.3 10*3/uL (ref 1.3–3.6)
Lymphocyte %: 28 % (ref 21.8–53.1)
MCV: 86 fL (ref 79–92)
Mono # K/uL: 0.6 10*3/uL (ref 0.3–0.8)
Monocyte %: 7.6 % (ref 5.3–12.2)
Neut # K/uL: 5.2 10*3/uL (ref 1.8–5.4)
Platelets: 192 10*3/uL (ref 150–330)
RBC: 5.4 MIL/uL (ref 4.6–6.1)
RDW: 13 % (ref 11.6–14.4)
Seg Neut %: 62.4 % (ref 34.0–67.9)
WBC: 8.3 10*3/uL (ref 4.2–9.1)

## 2011-11-15 ENCOUNTER — Encounter: Payer: Self-pay | Admitting: Primary Care

## 2011-11-15 ENCOUNTER — Other Ambulatory Visit: Payer: Self-pay | Admitting: Primary Care

## 2011-11-15 ENCOUNTER — Ambulatory Visit: Payer: Self-pay | Admitting: Primary Care

## 2011-11-15 VITALS — BP 120/64 | HR 76 | Ht 73.0 in | Wt 279.0 lb

## 2011-11-15 DIAGNOSIS — IMO0002 Reserved for concepts with insufficient information to code with codable children: Secondary | ICD-10-CM

## 2011-11-15 MED ORDER — ONETOUCH ULTRA 2 W/DEVICE KIT *A*
PACK | Status: DC
Start: 2011-11-15 — End: 2012-10-21

## 2011-11-15 MED ORDER — GLUCOSE BLOOD VI STRP *A*
ORAL_STRIP | Status: DC
Start: 2011-11-15 — End: 2012-11-18

## 2011-11-15 MED ORDER — INSULIN PEN NEEDLE 31G X 8 MM MISC *I*
Status: DC
Start: 2011-11-15 — End: 2012-02-20

## 2011-11-15 MED ORDER — INSULIN GLARGINE 100 UNIT/ML SC SOLN PEN *A*
10.0000 [IU] | Freq: Every evening | SUBCUTANEOUS | Status: DC
Start: 2011-11-15 — End: 2011-11-16

## 2011-11-15 NOTE — Progress Notes (Signed)
Subjective: uncontrolled DM     Patient ID: Noah Craig is a 45 y.o. male.    HPI: He is accompanied by his fiancee, Fannie Knee    Uncontrolled DM: he is taking his medication daily. He continue to complaint of severe fatigue and uncontrolled High sugars. He C/O blurred vision when his sugars are high. No tingling or numbness in feet. No CP or SOB. He feel like exhausted and unable to work.     Groin rash: Has been improving, he is finishing his antibiotics.    Nonsmoker     Patient's medications, allergies, past medical, surgical, social and family histories were reviewed and updated as appropriate.    Review of Systems       per HPI     Objective:   Physical Exam     Eye: No pallor or icterus  Heart: S1-S2 normal no murmur detected  Lung: Breath sounds are bilaterally equal  Abdomen: Soft nontender bowel sounds present  Extremities: No edema  Neuro: Nonfocal     Plan:      Uncontrolled DM: his sugars continue to be very high, he continue to feel exhausted. He is on maximal medical treatment. I don't think it is going to improve his sugars. Discussed multiple options. Best option at this time is to start nocturnal basal insulin with lantus 10 units at night. Advise to call back in 3 days with fasting sugars. Continue current medication. Pt is in hard job and it will be hard for him to work at this time with new insulin and variable sugars, work excuse given and pt to go on temporary disability until his sugars are better controlled. Diabetic and insulin teaching done. Pt met with case manager in office and watched the video. Letter given to excuse from work.    Follow up in 1 month.    Spent 30 min with patient, face to face. More than 50% time spent in counseling and education.

## 2011-11-15 NOTE — Progress Notes (Signed)
I met with Tawanna Cooler and his Bayard Hugger after his appointment with Dr Noah Charon today. He was in need of a new meter so we reviewed the One Touch Ultra 2 and he was able to demonstrate the correct way to use the meter. Prescriptions were sent to his pharmacy. He is going to start Lantus tomorrow evening and we discussed details about Lantus and how to administer it. He was given the Lantus administration sheet as well as the website to view at home. I demonstrated with the sample pen how to attach the needle, prime the pen and dial the correct dose along with where  And how to administer it. A successful return demonstration was done. We spent a lot of time discussing diabetes and the role of life style changes. He states his blood sugars have "been all over the place for the past 3 weeks" and complains of just not feeling well "even though my lab work is normal". He also stated Dr Noah Charon felt that the Lantus will most likely be used shortterm until they can get his sugar back under control. He admitted to an abdominal injury at work in August and has not felt well since then. He also stated he had other stressors in his life that he hopes will be gone by the end of the year but did not want to elaborate any further. He appears tense, anxious and slightly depressed. He has met with a CDE in the past and had been followed by an endocrinologist in the past as well. Both he and Fannie Knee are very knowledgeable about diabetes and Sue's daughter is a RD and has been helpful to them. I will check in next week with him to se how he is doing and to get his readings to Dr Noah Charon.

## 2011-11-16 ENCOUNTER — Telehealth: Payer: Self-pay | Admitting: Primary Care

## 2011-11-16 MED ORDER — INSULIN GLARGINE 100 UNIT/ML SC SOLN PEN *A*
10.0000 [IU] | Freq: Every evening | SUBCUTANEOUS | Status: DC
Start: 2011-11-16 — End: 2011-12-19

## 2011-11-16 NOTE — Telephone Encounter (Signed)
Pt takes 10 units.  Given 5 pens.. Per insurance they will only fill 90 day supply... The way rx is written, it will last 150 days.  Each pen = 3 ml.   --pt needs this tonight, we need to call pharm-

## 2011-11-16 NOTE — Telephone Encounter (Signed)
Called in another prescription and also talked to pharmacy.

## 2011-11-19 ENCOUNTER — Telehealth: Payer: Self-pay | Admitting: Primary Care

## 2011-11-19 NOTE — Telephone Encounter (Signed)
I left a message for him to call back with an update on how he is doing with giving himself the Lantus injection. I will get his BG readings and answer any questions he may have at that time as well.

## 2011-11-20 ENCOUNTER — Telehealth: Payer: Self-pay | Admitting: Primary Care

## 2011-11-20 NOTE — Telephone Encounter (Signed)
Started his insulin on Saturday, 11/17/11  BG readings for 11/18/11 ~  Am before breakfast he was 152,  12:00pm before lunch he was 158,  5:00pm before dinner he was 123, and 9pm he was 197.  11/19/11~  Before breakfast 155,  Before lunch he was 243,  Before dinner he was 184 and 9pm he was 167.  11/20/11, before breakfast he was 133,  2 hours after breakfast he was 298.   He did state on Saturday, he hunted all day and did not eat too much at all, his numbers were lower.

## 2011-11-21 NOTE — Telephone Encounter (Signed)
Increase lantus to 15 units at night and call back on Monday. thanks

## 2011-11-21 NOTE — Telephone Encounter (Signed)
Spoke with patient who states he is doing well with the injections and does not have any questions at this time. He phoned in his glucose readings and is awaiting a call back from Dr Noah Charon. He was told that I remain available to him if needed.

## 2011-11-21 NOTE — Telephone Encounter (Signed)
Pt informed.  WCB on Mon.

## 2011-11-26 NOTE — Telephone Encounter (Signed)
Advised to call with readings:  11/22nd:  Before breakfast:   171        Before  lunch:      285        After dinner:    265  11/23rd:               134           Not done                    Before dinner:  194 & after:  201            11/24th:                                     135       Before lunch        285       before               276  11/25th                                      158         After lnch            122     Before dinner   164  & after   180  11/26:                                         154        after lunch           357   & after lunch  263

## 2011-11-27 NOTE — Telephone Encounter (Signed)
Spoke to [employer]. All set

## 2011-11-27 NOTE — Telephone Encounter (Signed)
Overall the number are improving, increase lantus to 18 units and call back on Monday. thanks

## 2011-12-07 ENCOUNTER — Telehealth: Payer: Self-pay | Admitting: Primary Care

## 2011-12-11 ENCOUNTER — Other Ambulatory Visit: Payer: Self-pay | Admitting: Primary Care

## 2011-12-11 MED ORDER — METFORMIN HCL 1000 MG PO TABS *I*
ORAL_TABLET | ORAL | Status: DC
Start: 2011-12-11 — End: 2012-04-28

## 2011-12-18 ENCOUNTER — Ambulatory Visit: Payer: Self-pay | Admitting: Primary Care

## 2011-12-19 ENCOUNTER — Encounter: Payer: Self-pay | Admitting: Primary Care

## 2011-12-19 ENCOUNTER — Ambulatory Visit: Payer: Self-pay | Admitting: Primary Care

## 2011-12-19 VITALS — BP 120/80 | HR 94 | Ht 73.0 in | Wt 287.0 lb

## 2011-12-19 DIAGNOSIS — E119 Type 2 diabetes mellitus without complications: Secondary | ICD-10-CM

## 2012-01-14 ENCOUNTER — Other Ambulatory Visit: Payer: Self-pay | Admitting: Primary Care

## 2012-01-14 MED ORDER — GLIPIZIDE 10 MG PO TABS *I*
10.0000 mg | ORAL_TABLET | Freq: Two times a day (BID) | ORAL | Status: DC
Start: 2012-01-14 — End: 2012-03-21

## 2012-01-14 MED ORDER — ONETOUCH ULTRASOFT LANCETS MISC *A*
Status: DC
Start: 2012-01-14 — End: 2012-12-25

## 2012-01-28 ENCOUNTER — Ambulatory Visit: Payer: Self-pay | Admitting: Primary Care

## 2012-01-28 ENCOUNTER — Encounter: Payer: Self-pay | Admitting: Primary Care

## 2012-01-28 VITALS — BP 130/80 | HR 84 | Ht 73.0 in | Wt 289.0 lb

## 2012-01-28 DIAGNOSIS — E119 Type 2 diabetes mellitus without complications: Secondary | ICD-10-CM

## 2012-01-28 LAB — COMPREHENSIVE METABOLIC PANEL
ALT: 60 U/L — ABNORMAL HIGH (ref 0–50)
AST: 29 U/L (ref 0–50)
Albumin: 4.5 g/dL (ref 3.5–5.2)
Alk Phos: 67 U/L (ref 40–130)
Anion Gap: 9 (ref 7–16)
Bilirubin,Total: 0.6 mg/dL (ref 0.0–1.2)
CO2: 26 mmol/L (ref 20–28)
Calcium: 9.1 mg/dL (ref 9.0–10.3)
Chloride: 108 mmol/L (ref 96–108)
Creatinine: 0.96 mg/dL (ref 0.67–1.17)
GFR,Black: >59 *
GFR,Caucasian: >59 *
Glucose: 130 mg/dL — ABNORMAL HIGH (ref 60–99)
Lab: 16 mg/dL (ref 6–20)
Potassium: 4.5 mmol/L (ref 3.3–5.1)
Sodium: 143 mmol/L (ref 133–145)
Total Protein: 6.9 g/dL (ref 6.3–7.7)

## 2012-01-28 LAB — C-PEPTIDE: C-Peptide: 4 ng/mL (ref 1.1–4.4)

## 2012-01-28 LAB — INSULIN, TOTAL: Insulin: 15 u[IU]/mL (ref 3–25)

## 2012-01-28 LAB — HM DIABETES FOOT EXAM

## 2012-01-28 NOTE — Progress Notes (Signed)
Subjective: Uncontrolled DM     Patient ID: Noah Craig is a 46 y.o. male.    HPI:     Uncontrolled DM: he is taking his medication daily. He feel better and sugars are improving except still high post prandial sugars. No tingling or numbness in feet. No CP or SOB. He is following diet and exercise, gained some weight.     Filling form: he continue to be on disability, he is not able to work due to insulin.     Nonsmoker     Patient's medications, allergies, past medical, surgical, social and family histories were reviewed and updated as appropriate.    Review of Systems     Per HPI     Objective:   Physical Exam     Eye: No pallor or icterus  Heart: S1-S2 normal no murmur detected  Lung: Breath sounds are bilaterally equal  Abdomen: Soft nontender bowel sounds present  Extremities: No edema  Neuro: Non focal  Feet: no lesion, pulses + B/L, normal sensation with mono filament test.      Plan:      Uncontrolled DM: fasting sugars are good but PP are still high. Await labs. He seems to be not producing any endogenous insulin and may probably need insulin before meals also. Await labs. May need a referral to endocrinologist.    Filling form: his form filled and provided to patient.     F/U in 2 months.

## 2012-01-29 ENCOUNTER — Telehealth: Payer: Self-pay | Admitting: Primary Care

## 2012-01-29 LAB — HEMOGLOBIN A1C: Hemoglobin A1C: 6.1 % — ABNORMAL HIGH (ref 4.0–6.0)

## 2012-01-29 NOTE — Telephone Encounter (Signed)
Message copied by Duane Lope on Tue Jan 29, 2012 11:36 AM  ------       Message from: Minta Balsam       Created: Tue Jan 29, 2012  9:40 AM         Labs are back, sugars good, HBA1C is down to 6.1 from 6.7, insulin is in normal limit and that means he is producing insulin internally. No changes. Will follow up as scheduled.

## 2012-01-29 NOTE — Telephone Encounter (Signed)
Left message to call back  

## 2012-01-29 NOTE — Telephone Encounter (Signed)
Called patient and informed him of results. td

## 2012-02-01 ENCOUNTER — Telehealth: Payer: Self-pay | Admitting: Primary Care

## 2012-02-01 NOTE — Telephone Encounter (Signed)
He called looking for a pancrease test results.

## 2012-02-01 NOTE — Telephone Encounter (Signed)
His test are in normal limit

## 2012-02-01 NOTE — Telephone Encounter (Signed)
Pt informed

## 2012-02-13 ENCOUNTER — Ambulatory Visit: Payer: Self-pay | Admitting: Primary Care

## 2012-02-20 ENCOUNTER — Other Ambulatory Visit: Payer: Self-pay | Admitting: Primary Care

## 2012-02-26 ENCOUNTER — Encounter: Payer: Self-pay | Admitting: Primary Care

## 2012-02-26 LAB — HM DIABETES EYE EXAM

## 2012-02-27 ENCOUNTER — Other Ambulatory Visit: Payer: Self-pay | Admitting: Primary Care

## 2012-02-27 MED ORDER — INSULIN PEN NEEDLE 31G X 8 MM MISC *I*
Status: DC
Start: 2012-02-27 — End: 2012-06-20

## 2012-02-27 NOTE — Telephone Encounter (Signed)
rx done by Dr Cletis Media on 2.20 never rcvd.  He has 2 left.  Can you fax & mail cover to [facility], [place]

## 2012-02-27 NOTE — Telephone Encounter (Signed)
FAXED & MAILED

## 2012-03-05 ENCOUNTER — Encounter: Payer: Self-pay | Admitting: Primary Care

## 2012-03-18 ENCOUNTER — Encounter: Payer: Self-pay | Admitting: Primary Care

## 2012-03-18 ENCOUNTER — Ambulatory Visit: Payer: Self-pay | Admitting: Primary Care

## 2012-03-18 VITALS — BP 140/80 | HR 70 | Ht 73.0 in | Wt 293.0 lb

## 2012-03-18 DIAGNOSIS — IMO0002 Reserved for concepts with insufficient information to code with codable children: Secondary | ICD-10-CM

## 2012-03-18 MED ORDER — PIOGLITAZONE HCL 15 MG PO TABS *I*
15.0000 mg | ORAL_TABLET | Freq: Every day | ORAL | Status: DC
Start: 2012-03-18 — End: 2012-06-05

## 2012-03-18 MED ORDER — INSULIN GLARGINE 100 UNIT/ML SC SOLN PEN *A*
20.0000 [IU] | Freq: Every evening | SUBCUTANEOUS | Status: DC
Start: 2012-03-18 — End: 2012-04-10

## 2012-03-18 NOTE — Progress Notes (Signed)
Subjective: Uncontrolled DM     Patient ID: Noah Craig is a 46 y.o. male.    HPI:     Uncontrolled DM: he is taking his medication daily. Still high post prandial sugars. No tingling or numbness in feet. No CP or SOB. He is following diet and exercise, gained some weight.     Filling form: he continue to be on disability, he is not able to work due to insulin.     Nonsmoker     Patient's medications, allergies, past medical, surgical, social and family histories were reviewed and updated as appropriate.    Review of Systems     Per HPI     Objective:   Physical Exam     Eye: No pallor or icterus  Heart: S1-S2 normal no murmur detected  Lung: Breath sounds are bilaterally equal  Abdomen: Soft nontender bowel sounds present  Extremities: No edema  Neuro: Non focal  Feet: no lesion, pulses + B/L, normal sensation with mono filament test.      Plan:      Uncontrolled DM: elevated Fasting sugar to 170 and PP to 250, advise to increase lantus to 20 units at night, start Actos 15 mg daily. Advise to start exercises after meal and he is in process of stationary bike. Advise to loose some weight.     Filling form: his form filled and provided to patient.     Spent greater than 25 min with patient, face to face. More than 50% time spent in counseling and education.

## 2012-03-21 ENCOUNTER — Other Ambulatory Visit: Payer: Self-pay | Admitting: Primary Care

## 2012-04-01 ENCOUNTER — Ambulatory Visit: Payer: Self-pay | Admitting: Primary Care

## 2012-04-10 ENCOUNTER — Other Ambulatory Visit: Payer: Self-pay | Admitting: Primary Care

## 2012-04-10 MED ORDER — INSULIN GLARGINE 100 UNIT/ML SC SOLN PEN *A*
20.0000 [IU] | Freq: Every evening | SUBCUTANEOUS | Status: DC
Start: 2012-04-10 — End: 2012-10-07

## 2012-04-11 NOTE — Telephone Encounter (Signed)
FAXED AND MAILED COVER

## 2012-04-28 ENCOUNTER — Ambulatory Visit: Payer: Self-pay | Admitting: Primary Care

## 2012-04-28 ENCOUNTER — Encounter: Payer: Self-pay | Admitting: Primary Care

## 2012-04-28 VITALS — BP 128/70 | HR 68 | Ht 73.0 in | Wt 290.2 lb

## 2012-04-28 DIAGNOSIS — R7989 Other specified abnormal findings of blood chemistry: Secondary | ICD-10-CM | POA: Insufficient documentation

## 2012-04-28 DIAGNOSIS — E119 Type 2 diabetes mellitus without complications: Secondary | ICD-10-CM

## 2012-04-28 DIAGNOSIS — E669 Obesity, unspecified: Secondary | ICD-10-CM

## 2012-04-28 DIAGNOSIS — E785 Hyperlipidemia, unspecified: Secondary | ICD-10-CM

## 2012-04-28 MED ORDER — METFORMIN HCL 1000 MG PO TABS *I*
ORAL_TABLET | ORAL | Status: DC
Start: 2012-04-28 — End: 2012-09-14

## 2012-04-28 NOTE — Progress Notes (Signed)
Subjective: DM     Patient ID: Noah Craig is a 46 y.o. male.    HPI:     DM: he is taking his medication daily. No tingling or numbness in feet. No hypoglycemia, No CP or SOB. He is following diet and exercise, sugar are much improved with stationary bike,     Hyperlipidemia: he is following diet and exercise, not taking any medication. No muscle ache. No CP or SOB    Elevated LFT: he is following diet and exercise, he lost some weight, no abdominal pain.    Nonsmoker     Patient's medications, allergies, past medical, surgical, social and family histories were reviewed and updated as appropriate.    Review of Systems     Per HPI     Objective:   Physical Exam     Eye: No pallor or icterus  Heart: S1-S2 normal no murmur detected  Lung: Breath sounds are bilaterally equal  Abdomen: Soft nontender bowel sounds present  Extremities: No edema  Neuro: Non focal     Plan:      DM: well controlled per last HBA1C. HBA1C is at goal. Goal is below 7. Continue current meds, repeat HBA1C    Hyperlipidemia: last lipid profile showed HDL below goal. LDL and TGD at goal. Repeat lipid profile    Elevated LFT: due to fatty liver, repeat liver profile. Encourage to continue diet and weight control.    F/U in 3-4 months.

## 2012-05-05 ENCOUNTER — Ambulatory Visit
Admit: 2012-05-05 | Discharge: 2012-05-05 | Disposition: A | Discharge status: Home / Self Care | Payer: Self-pay | Source: Ambulatory Visit | Attending: Primary Care | Admitting: Primary Care

## 2012-05-05 DIAGNOSIS — E119 Type 2 diabetes mellitus without complications: Secondary | ICD-10-CM

## 2012-05-05 DIAGNOSIS — E785 Hyperlipidemia, unspecified: Secondary | ICD-10-CM

## 2012-05-05 LAB — COMPREHENSIVE METABOLIC PANEL
ALT: 59 U/L — ABNORMAL HIGH (ref 0–50)
AST: 37 U/L (ref 0–50)
Albumin: 4.5 g/dL (ref 3.5–5.2)
Alk Phos: 66 U/L (ref 40–130)
Anion Gap: 8 (ref 7–16)
Bilirubin,Total: 0.6 mg/dL (ref 0.0–1.2)
CO2: 24 mmol/L (ref 20–28)
Calcium: 9.2 mg/dL (ref 9.0–10.3)
Chloride: 107 mmol/L (ref 96–108)
Creatinine: 1.07 mg/dL (ref 0.67–1.17)
GFR,Black: 96 *
GFR,Caucasian: 83 *
Glucose: 132 mg/dL — ABNORMAL HIGH (ref 60–99)
Lab: 20 mg/dL (ref 6–20)
Potassium: 4.1 mmol/L (ref 3.3–5.1)
Sodium: 139 mmol/L (ref 133–145)
Total Protein: 7 g/dL (ref 6.3–7.7)

## 2012-05-05 LAB — LIPID PANEL
Chol/HDL Ratio: 2.7
Cholesterol: 114 mg/dL
HDL: 43 mg/dL
LDL Calculated: 63 mg/dL
Non HDL Cholesterol: 71 mg/dL
Triglycerides: 41 mg/dL

## 2012-05-06 LAB — HEMOGLOBIN A1C: Hemoglobin A1C: 6.4 % — ABNORMAL HIGH (ref 4.0–6.0)

## 2012-05-08 ENCOUNTER — Telehealth: Payer: Self-pay | Admitting: Primary Care

## 2012-05-08 NOTE — Telephone Encounter (Signed)
Message copied by Marcha Solders on Thu May 08, 2012  9:39 AM  ------       Message from: Minta Balsam       Created: Wed May 07, 2012  9:03 PM         Let him know that all the labs are back and everything is well controlled, diabetes is very well controlled, OK to stop actos for now and see if sugar stay low. Call us back in 1-2 weeks to report sugars.  ------

## 2012-05-08 NOTE — Telephone Encounter (Signed)
Pt informed

## 2012-05-08 NOTE — Telephone Encounter (Signed)
Left message to call office

## 2012-05-29 ENCOUNTER — Other Ambulatory Visit: Payer: Self-pay | Admitting: Primary Care

## 2012-06-03 ENCOUNTER — Telehealth: Payer: Self-pay | Admitting: Primary Care

## 2012-06-03 NOTE — Telephone Encounter (Signed)
Dr. Noah Charon discussed at [facility] today that Noah Craig was at [facility].  Noah Craig requested ED information from [facility].  Phone call to patient for status following recent hospital visit and to schedule appointment.  Noah Craig stated he is doing "pretty good".  I scheduled a hospital follow up appointment for Thursday, 06/05/12.

## 2012-06-05 ENCOUNTER — Encounter: Payer: Self-pay | Admitting: Primary Care

## 2012-06-05 ENCOUNTER — Ambulatory Visit: Payer: Self-pay | Admitting: Primary Care

## 2012-06-05 VITALS — BP 140/84 | HR 78 | Ht 73.0 in | Wt 291.0 lb

## 2012-06-05 DIAGNOSIS — N179 Acute kidney failure, unspecified: Secondary | ICD-10-CM

## 2012-06-05 LAB — BASIC METABOLIC PANEL
Anion Gap: 11 (ref 7–16)
CO2: 25 mmol/L (ref 20–28)
Calcium: 9.3 mg/dL (ref 9.0–10.3)
Chloride: 104 mmol/L (ref 96–108)
Creatinine: 0.96 mg/dL (ref 0.67–1.17)
GFR,Black: 110 *
GFR,Caucasian: 95 *
Glucose: 149 mg/dL — ABNORMAL HIGH (ref 60–99)
Lab: 16 mg/dL (ref 6–20)
Potassium: 4.2 mmol/L (ref 3.3–5.1)
Sodium: 140 mmol/L (ref 133–145)

## 2012-06-05 NOTE — Progress Notes (Signed)
Subjective: syncope     Patient ID: Noah Craig is a 46 y.o. male.    HPI    Syncope: he is here for hospital follow up. 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 His sugar was normal in ambulance and he was taken to [facility] and underwent CT head, CXR and lab and showed to be dehydrated. He was given IVF and discharged and doing good since then. He does not remember the episode. No headache, no focal weakness.     Non smoker    Patient's medications, allergies, past medical, surgical, social and family histories were reviewed and updated as appropriate.    Review of Systems     per HPI    Objective:   Physical Exam    Gen: NAD  Heart; S1S2 normal, no murmur  Lungs: clear, no wheezing  Ext; no edema  Neuro: no focal abnormality     Plan:      Syncope: likely due to heat stroke, reviewed all the paper work from [facility] and showed Acute kidney injury. Pt is advise to drink liquids thru out the day, recheck SMA 8.     Sugars stable off actos, continue current meds.

## 2012-06-06 ENCOUNTER — Telehealth: Payer: Self-pay | Admitting: Primary Care

## 2012-06-06 NOTE — Telephone Encounter (Signed)
Left detailed message on voice mail. Any questions to call office.

## 2012-06-06 NOTE — Telephone Encounter (Signed)
Left detailed message on voice mail. If any questions please call.

## 2012-06-06 NOTE — Telephone Encounter (Signed)
Message copied by Marcha Solders on Fri Jun 06, 2012 10:32 AM  ------       Message from: Minta Balsam       Created: Thu Jun 05, 2012 11:55 PM         Let him know that his elevated kidney number is back to normal.  ------

## 2012-06-06 NOTE — Telephone Encounter (Signed)
Message copied by Marcha Solders on Fri Jun 06, 2012 10:30 AM  ------       Message from: Minta Balsam       Created: Thu Jun 05, 2012 11:55 PM         Let him know that his elevated kidney number is back to normal.  ------

## 2012-06-20 ENCOUNTER — Other Ambulatory Visit: Payer: Self-pay | Admitting: Primary Care

## 2012-06-24 NOTE — Telephone Encounter (Signed)
Mailed script to pharmacy.

## 2012-08-15 ENCOUNTER — Ambulatory Visit: Payer: Self-pay | Admitting: Primary Care

## 2012-08-15 ENCOUNTER — Encounter: Payer: Self-pay | Admitting: Primary Care

## 2012-08-15 VITALS — BP 128/72 | HR 64 | Ht 73.0 in | Wt 289.0 lb

## 2012-08-15 DIAGNOSIS — E669 Obesity, unspecified: Secondary | ICD-10-CM

## 2012-08-15 DIAGNOSIS — E119 Type 2 diabetes mellitus without complications: Secondary | ICD-10-CM

## 2012-08-15 DIAGNOSIS — Z299 Encounter for prophylactic measures, unspecified: Secondary | ICD-10-CM

## 2012-08-15 DIAGNOSIS — I10 Essential (primary) hypertension: Secondary | ICD-10-CM

## 2012-08-15 LAB — BASIC METABOLIC PANEL
Anion Gap: 9 (ref 7–16)
CO2: 25 mmol/L (ref 20–28)
Calcium: 8.4 mg/dL — ABNORMAL LOW (ref 8.6–10.2)
Chloride: 104 mmol/L (ref 96–108)
Creatinine: 1.01 mg/dL (ref 0.67–1.17)
GFR,Black: 103 *
GFR,Caucasian: 89 *
Glucose: 235 mg/dL — ABNORMAL HIGH (ref 60–99)
Lab: 14 mg/dL (ref 6–20)
Potassium: 4.2 mmol/L (ref 3.3–5.1)
Sodium: 138 mmol/L (ref 133–145)

## 2012-08-15 LAB — ALT: ALT: 57 U/L — ABNORMAL HIGH (ref 0–50)

## 2012-08-15 NOTE — Progress Notes (Addendum)
Subjective: DM     Patient ID: Noah Craig is a 46 y.o. male.    HPI:     DM: He is taking his medication daily. No tingling or numbness in feet. No change in vision. No hypoglycemia, He is following diet and exercise and uses bike daily.     Hyperlipidemia: he is following diet and exercise, not taking any medication. No muscle ache. No CP or SOB    Hypertension: he is following diet and exercise, no CP or SOB. Lost some weight.     Elevated LFT: he is following diet and exercise, he lost some weight, no abdominal pain.    Nonsmoker     Patient's medications, allergies were reviewed and updated as appropriate.    Review of Systems     Per HPI     Objective:   Physical Exam     Eye: No pallor or icterus  Heart: S1-S2 normal no murmur detected, no carotid bruit  Lung: Breath sounds are bilaterally equal  Abdomen: Soft nontender bowel sounds present  Extremities: No edema  Neuro: Non focal     Plan:      [facility] Diabetes Plan    According to current ADA guidelines the patient A1C goal is: less than 7  The patient's last A1C was 05/05/2012: Hemoglobin A1C 6.4 %* (High; Range: 4.0 - 6.0 %)  , the patient is: at goal.  The plan to reach goal   1.  Reviewed pt's understanding of medications including barriers to adherence    2.  Recommended lifestyle modifications: exercise plan, glucose monitoring and medication compliance   3.  The patient understands their clinical goals and will undertake self-management recommendations including: weight reduction  exercise plan  diet improvements  glucose monitoring   4.  Medications Management: no changes made   5.  Referral to Care Management:: No   6.  Patient Ed/Self Management tools provided: Yes    [facility] Hypertension Plan    Based on this patient's clinical history and according to JNC guidelines target BP goal is: less than 130/80  Based on the patient's last BP of BP: 128/72 mmHg the patient is: at goal  The plan to reach goal   1.  Reviewed pt's understanding of medications  including any barriers to adherence.   2.  Recommended lifestyle modifications: weight reduction, exercise plan, DASH eating plan and dietary sodium reduction   3.  The patient understands their clinical goals and will undertake self-management recommendations including:weight reduction  exercise plan  diet improvements     4.  Medication Management: no changes made    5.  Referral to Care Management:: No   6.  Patient Ed/Self-management tools provided: Yes    HYPERLIPIDEMIA    According to ATP III Guidelines LDL at goal. Based on risk profile and co-morbidities LDL goal is 100.  HDL at goal  Based on risk profile and co-morbidities HDL goal is 40  Triglyceride at goal Based on risk profile and co-morbidities Triglyceride goal is 150.    Plan to reach goal includes:  Lifestyle modifications: weight reduction, discussed low cholesterol and saturated fat diet, discussed low carbohydrate diet and discussed aerobic physical activity  Medication Management: no changes made  Referral for Care Management: no    Elevated LFT: due to fatty liver, repeat liver profile. Encourage to continue diet and weight control.    HM: colonoscopy due in Oct, 2013, referral sent.     F/U in 4 months.

## 2012-08-15 NOTE — Addendum Note (Signed)
Addended by: Aura Fey on: 08/15/2012 09:31 AM     Modules accepted: Orders

## 2012-08-15 NOTE — Addendum Note (Signed)
Addended by: Minta Balsam on: 08/15/2012 01:40 PM     Modules accepted: Orders

## 2012-08-16 LAB — HEMOGLOBIN A1C: Hemoglobin A1C: 6.5 % — ABNORMAL HIGH (ref 4.0–6.0)

## 2012-08-18 ENCOUNTER — Telehealth: Payer: Self-pay | Admitting: Primary Care

## 2012-08-18 NOTE — Telephone Encounter (Signed)
Left a detailed message with instructions to call if any questions or concerns.

## 2012-08-18 NOTE — Progress Notes (Signed)
Our office has been unable to reach patient.  Patient was called and left a message requesting to call back.  Patient was also mailed a letter.       Please note that the patient is on metformin medication he will need a pre screen first

## 2012-08-18 NOTE — Telephone Encounter (Signed)
Message copied by Gwenette Greet on Mon Aug 18, 2012 10:24 AM  ------       Message from: Minta Balsam       Created: Sun Aug 17, 2012 10:29 PM         Let him know that diabetes well controlled, HBA1C is 6.5. Liver function still mildly up but continue to come down. Liver function are due to fatty liver and encourage weight loss. thanks  ------

## 2012-08-25 ENCOUNTER — Ambulatory Visit: Payer: Self-pay | Admitting: Primary Care

## 2012-09-14 ENCOUNTER — Other Ambulatory Visit: Payer: Self-pay | Admitting: Primary Care

## 2012-09-17 ENCOUNTER — Telehealth: Payer: Self-pay | Admitting: Primary Care

## 2012-09-17 NOTE — Telephone Encounter (Signed)
I will cut back on insulin, per my record, he is taking lantus 20 units, ask him to cut down to 15 units and call us back on Monday and if still sugar are low, will cut down further.

## 2012-09-17 NOTE — Telephone Encounter (Signed)
Patient said his glucose levels are running low and he feels it during the day. He doesn't have his meter with him for readings but said normally in the morning around 108.  Verified dosages of all his medications.  He wonders if he should cut back on glipizide.

## 2012-09-17 NOTE — Telephone Encounter (Signed)
Spoke to the patient and gave him the updated instructions on his insulin reduction

## 2012-09-30 ENCOUNTER — Telehealth: Payer: Self-pay | Admitting: Primary Care

## 2012-09-30 NOTE — Telephone Encounter (Signed)
BG's in the am are running between 95-120, & having crashes about 10:30-11 am the last 3 days.

## 2012-09-30 NOTE — Telephone Encounter (Signed)
Talked to patient, advise to cut down lantus to 10 units and call back in 3 days with numbers.

## 2012-10-07 ENCOUNTER — Telehealth: Payer: Self-pay | Admitting: Primary Care

## 2012-10-07 NOTE — Telephone Encounter (Signed)
Stop insulin, monitor sugars and call back if fasting is above 140. thanks

## 2012-10-07 NOTE — Telephone Encounter (Signed)
ON 10 UNITS OF INSULIN:  BG's today:    105 this am, then 2.5 hours later 150, then 1.5 hours later 75.

## 2012-10-07 NOTE — Telephone Encounter (Signed)
CALLED AND INFORMED HIM.

## 2012-10-15 ENCOUNTER — Other Ambulatory Visit: Payer: Self-pay | Admitting: Primary Care

## 2012-10-21 ENCOUNTER — Encounter: Payer: Self-pay | Admitting: Gastroenterology

## 2012-10-21 ENCOUNTER — Ambulatory Visit
Admit: 2012-10-21 | Discharge: 2012-10-21 | Disposition: A | Discharge status: Home / Self Care | Payer: Self-pay | Attending: Gastroenterology | Admitting: Gastroenterology

## 2012-10-21 VITALS — BP 136/72 | HR 60 | Ht 74.0 in | Wt 289.4 lb

## 2012-10-21 DIAGNOSIS — E119 Type 2 diabetes mellitus without complications: Secondary | ICD-10-CM

## 2012-10-21 MED ORDER — BISACODYL EC 5 MG PO TBEC *I*
DELAYED_RELEASE_TABLET | ORAL | Status: DC
Start: 2012-10-21 — End: 2013-07-13

## 2012-10-21 MED ORDER — POLYETHYLENE GLYCOL 3350 PO POWD *I*
ORAL | Status: DC
Start: 2012-10-21 — End: 2013-07-13

## 2012-10-21 NOTE — H&P (Signed)
Noah Craig  [id number]     10/21/2012  Minta Balsam, MD  [address] [place] [address]  [address]  [place], [place] [address]        We had the pleasure of seeing  Noah Craig in our Gastroenterology and Hepatology clinic on October 21, 2012. As you know, he is a 46 y.o. male who presents for initial consultation and pre-evaluation prior to undergoing routine screening colonoscopy.  He presents today stating that he is feeling overall well. He denies any signs or symptoms of acute GI or liver disease to include abdominal pain, nausea, vomiting, jaundice, pruritis, icterus, ascites, lower extremity edema, forgetfulness or confusion. He reports that his bowel habits are regular, without blood or melena. He states that his appetite is unchanged and his weight is stable. He also denies any fevers, chills, chest pains or shortness of breath.  He does report some intermittent bright red blood per rectum. He describes this as a "tint of red," present both in the toilet bowl and on the toilet tissue. He believes this could be related to hemorrhoids. He did have prior colonoscopy here, last 5 years ago. He reports there were findings of multiple polyps, including "precancerous" polyps, and repeat was recommended in 5 years.  He does have a history of DM, currently on Metformin 1000 mg tablet, 1 tablet twice daily.    Vitals:   Filed Vitals:    10/21/12 1344   BP: 136/72   Pulse: 60   Height: 188 cm (6\' 2" )   Weight: 131.271 kg (289 lb 6.4 oz)       Allergies/Sensitivities:  Allergies   Allergen Reactions   . No Known Drug Allergy      Created by Conversion - 0;    . No Known Latex Allergy      Created by Conversion - 0;        Medications: Current outpatient prescriptions:glipiZIDE (GLUCOTROL) 10 MG tablet, TAKE ONE TABLET BY MOUTH TWICE A DAY BEFORE MEALS, Disp: 60 tablet, Rfl: 3;  metFORMIN (GLUCOPHAGE) 1000 MG tablet, TAKE ONE TABLET BY MOUTH TWICE A DAY WITH MEALS, Disp: 60 tablet, Rfl: 3;  B-D ULTRAFINE III SHORT PEN 31G X 8 MM, USE 1 ONCE  DAILY AS DIRECTED, Disp: 100 each, Rfl: 3  lancets (ONETOUCH ULTRASOFT), USE FOUR TIMES DAILY AS DIRECTED FOR DIABETES, Disp: 100 each, Rfl: 4;  blood glucose (ONE TOUCH) test strip, Test  2 - 4 times a day.   Brand name of strips one touch ultra 2, Disp: 100 strip, Rfl: 5;  blood glucose VI test strips (ONE TOUCH ULTRA TEST), USE ONE STRIP 4 TIMES DAILY DX: DM 250, Disp: 100 strip, Rfl: 4  polyethylene glycol (GLYCOLAX) powder, Mix 1/2 bottle of Miralax into each 32 oz Gatorade bottle and use as directed by colonoscopy instructions., Disp: 255 g, Rfl: 0;  bisacodyl (DULCOLAX) 5 MG EC tablet, Use as directed per colonoscopy instructions., Disp: 4 tablet, Rfl: 0    Past Medical Hx:   Past Medical History   Diagnosis Date   . Diabetes mellitus        Past Surgical Hx:   Past Surgical History   Procedure Laterality Date   . Knee surgery         Family Hx: History reviewed. No pertinent family history.    Social Hx:   History   Substance Use Topics   . Smoking status: Former Games developer   . Smokeless tobacco: Never Used   . Alcohol Use: Yes  beer socially       ROS:   General:  No malaise, fatigue, fever, chills, sweats.    HEENT:  No tinnitus, coryza, diplopia, dysgeusia.    Cardiovascular:  No chest pain, palpitations.    Respiratory:  No dyspnea, cough.    Musculoskeletal:  No myalgias.    GI:  See above.    GU:  No dysuria, hematuria.    Skin:  No rash, pruritus, jaundice.    Neuro:   No focal numbness, weakness, tremor.    Psychiatric:  No somnolence, confusion, dysphoria.    Endocrine:  No heat or cold intolerance.    Heme/Lymph:  No easy bruising/bleeding.  No concerning lumps.    Allergy/Rheum:  No arthralgias/arthritis.  No rash/hives.     Physical Exam:   General: This is a well appearing, middle aged gentleman in no acute distress.  Vital signs are stable.  Skin:  No rashes, jaundice.  Warm and dry.   HEENT: Sclerae are anicteric, oral mucosa are pink and moist.  No oropharyngeal abnormalities.    Neck:  No  masses, tracheal deviation.    Lungs:  Clear bilaterally to auscultation. Normal respiratory effort.    Cor:  RRR without murmur, rub, or gallop.  No heave or thrill.    Abdomen:  Normal bowel sounds, no bruits.  No distention.  Nontender.  No hepatosplenomegaly, masses, aneurysms, hernias, or fluid wave/shifting dullness.    Rectum: Deferred, as it was not pertinent to this evaluation.   Extremities:  Warm, normal pedal pulses, good capillary refill of nail beds, no edema.  Neuro:  Alert and oriented x 3 with appropriate affect.   Ambulatory:  No gross motor deficits or ataxia/dysarthria noted.    Breast/pelvic:  Examination deferred to primary care physician or gynecologist.    Labs and Imaging:  Colonoscopy (10/21/07)  2 sigmoid polyps, status post polypectomy. 1 polyp is tubular adenoma, 1 polyp is hyperplastic.    Impression(s)/Recommendation(s): This is a pleasant 46 y.o. gentleman who presents today prior to undergoing routine screening colonoscopy. He is asymptomatic for any acute GI disease, and denies any recent changes in his bowel habits. He denies any blood in the stool or melena as well. Our recommendations at this time include the following:     1. Colon Cancer Screening - We will proceed with routine screening colonoscopy with a Miralax preparation. Instructions for the procedure preparation were reviewed in full today with the opportunity to ask questions; all questions were answered at the time of the appointment today. He was provided with written instructions to take home and review as well. Prescriptions for the Miralax and Duloclax will be sent to his pharmacy.     He is on Metformin 1000 mg tablet, 1 tablet twice daily for glycemic control.  Prior to the procedure, while undergoing the preparation, he will require 1/2 dose adjustment.  We have discussed that he will take Metformin 1000 mg tablet, 1/2 tablet twice daily the day prior to procedure while preparing.  We have discussed checking  fingerstick blood glucose while on clear liquid diet, as well as having sugar containing beverages.    Thank you for allowing [place] to participate in his care, please do not hesitate to contact our office for any questions or concerns at [phone].    Elenore Rota, PA

## 2012-10-29 ENCOUNTER — Encounter: Payer: Self-pay | Admitting: Gastroenterology

## 2012-10-29 ENCOUNTER — Ambulatory Visit
Admit: 2012-10-29 | Discharge: 2012-10-29 | Disposition: A | Discharge status: Home / Self Care | Payer: Self-pay | Attending: Gastroenterology | Admitting: Gastroenterology

## 2012-10-29 HISTORY — DX: Other complications of anesthesia, initial encounter: T88.59XA

## 2012-10-29 HISTORY — DX: Polyp of colon: K63.5

## 2012-10-29 HISTORY — DX: Unspecified osteoarthritis, unspecified site: M19.90

## 2012-10-29 LAB — HM COLONOSCOPY

## 2012-10-29 LAB — POCT GLUCOSE: Glucose POCT: 135 mg/dL — ABNORMAL HIGH (ref 60–99)

## 2012-10-29 MED ORDER — MIDAZOLAM HCL 5 MG/5ML IJ SOLN *I*
INTRAMUSCULAR | Status: AC
Start: 2012-10-29 — End: 2012-10-29
  Filled 2012-10-29: qty 10

## 2012-10-29 MED ORDER — SODIUM CHLORIDE 0.9 % IV SOLN WRAPPED *I*
100.0000 mL/h | Status: DC
Start: 2012-10-29 — End: 2012-10-30
  Administered 2012-10-29: 100 mL/h via INTRAVENOUS

## 2012-10-29 MED ORDER — MEPERIDINE HCL 50 MG/ML IJ SOLN *WRAPPED*
INTRAMUSCULAR | Status: AC
Start: 2012-10-29 — End: 2012-10-29
  Filled 2012-10-29: qty 2

## 2012-10-29 MED ORDER — MEPERIDINE HCL 50 MG/ML IJ SOLN *WRAPPED*
INTRAMUSCULAR | Status: AC | PRN
Start: 2012-10-29 — End: 2012-10-29
  Administered 2012-10-29: 50 mg via INTRAVENOUS

## 2012-10-29 MED ORDER — NALOXONE HCL 0.4 MG/ML IJ SOLN *WRAPPED*
Status: AC
Start: 2012-10-29 — End: 2012-10-29
  Filled 2012-10-29: qty 1

## 2012-10-29 MED ORDER — FLUMAZENIL 0.1 MG/ML IV SOLN *WRAPPED*
INTRAVENOUS | Status: AC
Start: 2012-10-29 — End: 2012-10-29
  Filled 2012-10-29: qty 5

## 2012-10-29 MED ORDER — MIDAZOLAM HCL 1 MG/ML IJ SOLN *I* WRAPPED
INTRAMUSCULAR | Status: AC | PRN
Start: 2012-10-29 — End: 2012-10-29
  Administered 2012-10-29 (×3): 2 mg via INTRAVENOUS

## 2012-10-29 NOTE — Discharge Instructions (Signed)
Gastroenterology Unit  Discharge Instructions for Colonoscopy      10/29/2012    8:54 AM    Colonoscopy and Polyp(s) Removed    Do not drive, operate heavy machinery, drink alcoholic beverages, make important personal or business decisions, or sign legal documents until the next day.      Return to your usual diet   Return to taking your usual medications    Things you may expect:   A small amount of bright red blood in your stool   It may be a few days before you have a bowel movement   You may have cramping, bloating, and feelings of "gas". These feelings should go away as you pass gas. If you still feel uncomfortable, walking around will help to pass the gas.   You were given medication to help you relax during the test. You may feel "fuzzy" and drowsy. Go home and rest for at least 4-6 hours.    You should call your doctor for any of the following:   Bad stomach pain   Fever   Bright red bleeding or clots (This may happen up to 20 days after the test.)   Dizziness or weakness that gets worse or lasts up to 24 hours.   Pain or redness at the IV site    If you have a serious problem after hours, Call [phone] to reach the GI physician on call. If you are unable to reach your doctor, go to the [facility] Emergency Department.    Follow Up Care:   If biopsies were taken during your procedure, we will send you the pathology results within 7-10 days. If you do not receive your pathology results after 10 days please call [phone]   Report will be sent to your primary doctor.   Repeat exam in  5 year(s)    New Prescriptions    No medications on file

## 2012-10-29 NOTE — Preop H&P (Signed)
OUTPATIENT PRE-PROCEDURE H&P    Chief Complaint / Indications for Procedure: screen    Past Medical History:   Past Medical History   Diagnosis Date   . Diabetes mellitus    . Complication of anesthesia      slow to awaken   . Colon polyp    . Arthritis      knees, hands     Past Surgical History   Procedure Laterality Date   . Knee surgery Bilateral      reconstruction; several surgeries   . Tonsillectomy       History reviewed. No pertinent family history.  History     Social History   . Marital Status: Legally Separated     Spouse Name: N/A     Number of Children: N/A   . Years of Education: N/A     Social History Main Topics   . Smoking status: Former Games developer   . Smokeless tobacco: Former Neurosurgeon     Quit date: 01/01/1992   . Alcohol Use: Yes      beer socially   . Drug Use: No   . Sexually Active: None     Other Topics Concern   . None     Social History Narrative   . None       Allergies:    Allergies   Allergen Reactions   . No Known Drug Allergy      Created by Conversion - 0;    . No Known Latex Allergy      Created by Conversion - 0;        Medications:  Current Outpatient Prescriptions   Medication   . naproxen sodium (ANAPROX) 220 MG tablet   . polyethylene glycol (GLYCOLAX) powder   . bisacodyl (DULCOLAX) 5 MG EC tablet   . glipiZIDE (GLUCOTROL) 10 MG tablet   . metFORMIN (GLUCOPHAGE) 1000 MG tablet   . B-D ULTRAFINE III SHORT PEN 31G X 8 MM   . lancets (ONETOUCH ULTRASOFT)   . blood glucose (ONE TOUCH) test strip   . blood glucose VI test strips (ONE TOUCH ULTRA TEST)     Current Facility-Administered Medications   Medication Dose Route Frequency   . sodium chloride IV  100 mL/hr Intravenous Continuous      Filed Vitals:    10/29/12 0730   BP: 140/79   Pulse: 81   Temp: 35.2 C (95.4 F)   Resp: 18   Height: 185.4 cm (6' 0.99")   Weight: 127.007 kg (280 lb)       ROS:  History obtained from the patient  General ROS: negative  Ophthalmic ROS: negative  ENT ROS: negative  Allergy and Immunology ROS:  negative  Hematological and Lymphatic ROS: negative  Respiratory ROS: no cough, shortness of breath, or wheezing  Cardiovascular ROS: no chest pain or dyspnea on exertion  Musculoskeletal ROS: negative  Neurological ROS: no TIA or stroke symptoms    Physical Examination:General: alert, oriented times three, no apparent distress, appearing age appropriate  Head: normocephalic, no masses, lesions, tenderness or abnormalities  Eyes: anicteric sclera, pupils are equally round and reactive to light, extraocular movements are intact  Oropharynx: normal, clear without erythema or exudate  Chest: symmetric, no deformities, no chest wall tenderness  Lungs: percussion normal, good diaphragmatic excursion, lungs clear to auscultation bilaterally  Heart: regular rate and rhythm, no murmurs, gallops or rubs  Abdomen: abdomen soft, normal active bowel sounds  Neuro: unremarkable without focal findings  Extremities/Musculoskeletal:  no cyanosis, no edema, intact times four    Lab Results: All labs in the last 72 hours   Recent Results (from the past 72 hour(s))   POCT GLUCOSE    Collection Time    10/29/12  7:37 AM       Result Value Range    Glucose POCT 135 (*) 60 - 99 mg/dL           Radiology impressions (last 30 days):  No results found.    Currently Active Problems:  Patient Active Problem List   Diagnosis Code   . Obesity 278.00   . Hyperlipidemia 272.4   . Diabetes mellitus 250.00   . Elevated liver function tests 790.6   . Hypertension 401.9        UPDATES TO PATIENT'S CONDITION on the DAY OF SURGERY/PROCEDURE    I. Updates to Patient's Condition (to be completed by a provider privileged to complete a H&P, following reassessment of the patient by the provider):    Full H&P done today; no updates needed.    II. Procedure Readiness   I have reviewed the patient's H&P and updated condition. By completing and signing this form, I attest that this patient is ready for surgery/procedure.      III. Attestation   I have reviewed  the updated information regarding the patient's condition and it is appropriate to proceed with the planned surgery/procedure.    Darryl Lent, MD as of 8:19 AM 10/29/2012

## 2012-10-29 NOTE — Procedures (Signed)
Procedure Report    10/29/2012  Noah Craig  [id number]    Procedure: Colonoscopy with biopsy  Pre-op diagnosis: colon cancer surveillance, history of polyps  Post-op diagnosis:  Colon polyps  Risks, benefits, alternatives discussed prior to consent, and appropriate patient and procedure identifiers undertaken prior to the procedure.    Incidental diagnosis: Hemorrhoids  Diverticulosis    Medications used: Versed 6 mg IV  Demerol 50 mg IV    EBL: <1 ml    Colonoscope advanced to the cecum, where the ileocecal valve was clearly identified, the cecal cap well visualized, and the preparation throughout the colon was adequate.    The ileum was intubated in a short distance and was normal.    Findings:    A polyp was found in the cecum. It was sessile. Size: 3 mm.  It was removed by cold biopsy forceps technique and recovered. Minimal to no bleeding and no complication.    A polyp was found in the transverse colon. It was sessile. Size: 3 mm.  It was removed by cold biopsy forceps technique and recovered. Minimal to no bleeding and no complication.    No other polyps, no other masses were seen.    No colitis or Crohn's disease was seen.    Diverticulosis was found in the entire colon.    Internal and external uncomplicated hemorrhoids were seen on video anoscopy.     Impression:  Colon polyps    Recommendations: await path results; followup colonoscopy in five years.    Marya Landry. Octavio Graves MD  Associate Professor of Clinical Medicine  Attending, Gastroenterology and Hepatology

## 2012-10-31 LAB — SURGICAL PATHOLOGY

## 2012-11-13 ENCOUNTER — Encounter: Payer: Self-pay | Admitting: Gastroenterology

## 2012-11-18 ENCOUNTER — Other Ambulatory Visit: Payer: Self-pay | Admitting: Primary Care

## 2012-12-03 ENCOUNTER — Telehealth: Payer: Self-pay | Admitting: Primary Care

## 2012-12-03 NOTE — Telephone Encounter (Signed)
AM BG READINGS HAVE BEEN HIGH FOR OVER A WEEK NOW,  OVER 140, TODAY WAS 160, YESTERDAY HE SPIKED AFTER LUNCH, WENT FROM 100 UP TO 200,  THIS AM WENT FROM 160 UP TO 190, 2 HOURS LATER.    He is not feeling great, his job is physical, but not too bad, he has come home the last few days exhausted.  And it's not that demanding of a job.  If he has anything with any type of sugar it knocks him out.   He is currently off his insulin as you recommended.

## 2012-12-05 NOTE — Telephone Encounter (Signed)
Advise him to resume lantus 10 units nightly to see if that bring the sugars down. Ask him to call with number in 4-5 days. Make sure he has lantus and supplies. thanks

## 2012-12-05 NOTE — Telephone Encounter (Signed)
Pt. Notified. Pt has appt to see you 12/13 do you want him to have blood work done prior? Would need orders put in. Please advise

## 2012-12-05 NOTE — Telephone Encounter (Signed)
Please see note below. 

## 2012-12-07 NOTE — Telephone Encounter (Signed)
Labs ordered, advise him to go fasting for 8 hours, also need a urine test to check for protein in urine. thanks

## 2012-12-08 NOTE — Telephone Encounter (Signed)
Spoke with patient, patient aware. °

## 2012-12-11 ENCOUNTER — Ambulatory Visit
Admit: 2012-12-11 | Discharge: 2012-12-11 | Disposition: A | Discharge status: Home / Self Care | Payer: Self-pay | Source: Ambulatory Visit | Attending: Primary Care | Admitting: Primary Care

## 2012-12-11 DIAGNOSIS — E119 Type 2 diabetes mellitus without complications: Secondary | ICD-10-CM

## 2012-12-11 DIAGNOSIS — E785 Hyperlipidemia, unspecified: Secondary | ICD-10-CM

## 2012-12-11 LAB — LIPID PANEL
Chol/HDL Ratio: 2.7
Cholesterol: 101 mg/dL
HDL: 37 mg/dL
LDL Calculated: 54 mg/dL
Non HDL Cholesterol: 64 mg/dL
Triglycerides: 48 mg/dL

## 2012-12-11 LAB — CBC
Hematocrit: 46 % (ref 40–51)
Hemoglobin: 16.2 g/dL (ref 13.7–17.5)
MCV: 87 fL (ref 79–92)
Platelets: 175 10*3/uL (ref 150–330)
RBC: 5.3 MIL/uL (ref 4.6–6.1)
RDW: 12.9 % (ref 11.6–14.4)
WBC: 7.8 10*3/uL (ref 4.2–9.1)

## 2012-12-11 LAB — BASIC METABOLIC PANEL
Anion Gap: 6 — ABNORMAL LOW (ref 7–16)
CO2: 30 mmol/L — ABNORMAL HIGH (ref 20–28)
Calcium: 9 mg/dL (ref 8.6–10.2)
Chloride: 104 mmol/L (ref 96–108)
Creatinine: 1.01 mg/dL (ref 0.67–1.17)
GFR,Black: 103 *
GFR,Caucasian: 89 *
Glucose: 149 mg/dL — ABNORMAL HIGH (ref 60–99)
Lab: 20 mg/dL (ref 6–20)
Potassium: 4.5 mmol/L (ref 3.3–5.1)
Sodium: 140 mmol/L (ref 133–145)

## 2012-12-11 LAB — HEMOGLOBIN A1C: Hemoglobin A1C: 6.4 % — ABNORMAL HIGH (ref 4.0–6.0)

## 2012-12-11 LAB — ALT: ALT: 64 U/L — ABNORMAL HIGH (ref 0–50)

## 2012-12-12 ENCOUNTER — Ambulatory Visit: Payer: Self-pay | Admitting: Primary Care

## 2012-12-12 ENCOUNTER — Encounter: Payer: Self-pay | Admitting: Primary Care

## 2012-12-12 VITALS — BP 128/76 | HR 77 | Wt 289.0 lb

## 2012-12-12 DIAGNOSIS — Z23 Encounter for immunization: Secondary | ICD-10-CM

## 2012-12-12 DIAGNOSIS — I10 Essential (primary) hypertension: Secondary | ICD-10-CM

## 2012-12-12 DIAGNOSIS — R7989 Other specified abnormal findings of blood chemistry: Secondary | ICD-10-CM

## 2012-12-12 DIAGNOSIS — E785 Hyperlipidemia, unspecified: Secondary | ICD-10-CM

## 2012-12-12 DIAGNOSIS — E119 Type 2 diabetes mellitus without complications: Secondary | ICD-10-CM

## 2012-12-12 LAB — MICROALBUMIN, URINE, RANDOM
Creatinine,UR: 173 mg/dL (ref 20–300)
Microalb/Creat Ratio: 2.9 mg MA/g CR (ref 0.0–29.9)
Microalbumin,UR: 0.5 mg/dL

## 2012-12-12 LAB — HM DIABETES FOOT EXAM

## 2012-12-12 NOTE — Progress Notes (Signed)
Subjective: DM     Patient ID: Noah Craig is a 46 y.o. male    HPI:     DM: He is taking his medication daily. No tingling or numbness in feet. No change in vision. No hypoglycemia, He is following diet and exercise. He is back on Lantus 10 units nightly    Hyperlipidemia: He is following diet and exercise, not taking any medication. No muscle ache. No CP or SOB.    Hypertension: He is following diet and exercise, no CP or SOB.    Elevated LFT: He is following diet and exercise, He lost some weight, no abdominal pain.    Nonsmoker     Patient's medications, allergies were reviewed and updated as appropriate.    Review of Systems     Per HPI     Objective:   Physical Exam     Eye: No pallor or icterus  Heart: S1-S2 normal no murmur detected, no carotid bruit  Lung: Breath sounds are bilaterally equal  Abdomen: Soft nontender bowel sounds present  Extremities: No edema  Neuro: Non focal  Feet: normal sensation by monofilament test, normal pulsations.      Plan:      DM: well controlled, continue current meds.    HTN: well controlled, continue diet and exercise    HYPERLIPIDEMIA: LDL at goal. HDL below goal. TGD at goal. Advise diet and exercise.    Elevated LFT: Due to fatty liver, Encourage to continue diet and weight control. Check hepatitis profile.    HM: colonoscopy up to date    F/U in 4 months.

## 2012-12-13 LAB — HEPATITIS B & C PROF
HBV Core Ab: NEGATIVE
HBV S Ab Quant: 0.36 m[IU]/mL
HBV S Ab: NEGATIVE
HBV S Ag: NEGATIVE
Hep C Ab: NEGATIVE

## 2012-12-25 ENCOUNTER — Other Ambulatory Visit: Payer: Self-pay | Admitting: Primary Care

## 2013-01-07 ENCOUNTER — Encounter: Payer: Self-pay | Admitting: Gastroenterology

## 2013-01-23 ENCOUNTER — Other Ambulatory Visit: Payer: Self-pay | Admitting: Primary Care

## 2013-02-19 ENCOUNTER — Other Ambulatory Visit: Payer: Self-pay | Admitting: Primary Care

## 2013-02-25 ENCOUNTER — Encounter: Payer: Self-pay | Admitting: Gastroenterology

## 2013-04-07 ENCOUNTER — Ambulatory Visit: Payer: Self-pay | Admitting: Primary Care

## 2013-04-07 ENCOUNTER — Encounter: Payer: Self-pay | Admitting: Primary Care

## 2013-04-07 VITALS — BP 124/82 | HR 94 | Temp 97.9°F | Ht 74.0 in | Wt 280.4 lb

## 2013-04-07 DIAGNOSIS — E559 Vitamin D deficiency, unspecified: Secondary | ICD-10-CM

## 2013-04-07 DIAGNOSIS — E119 Type 2 diabetes mellitus without complications: Secondary | ICD-10-CM

## 2013-04-07 DIAGNOSIS — E785 Hyperlipidemia, unspecified: Secondary | ICD-10-CM

## 2013-04-07 DIAGNOSIS — I10 Essential (primary) hypertension: Secondary | ICD-10-CM

## 2013-04-07 LAB — BASIC METABOLIC PANEL
Anion Gap: 8 (ref 7–16)
CO2: 27 mmol/L (ref 20–28)
Calcium: 9.7 mg/dL (ref 8.6–10.2)
Chloride: 101 mmol/L (ref 96–108)
Creatinine: 0.96 mg/dL (ref 0.67–1.17)
GFR,Black: 109 *
GFR,Caucasian: 95 *
Glucose: 250 mg/dL — ABNORMAL HIGH (ref 60–99)
Lab: 19 mg/dL (ref 6–20)
Potassium: 4.5 mmol/L (ref 3.3–5.1)
Sodium: 136 mmol/L (ref 133–145)

## 2013-04-07 LAB — CBC
Hematocrit: 43 % (ref 40–51)
Hemoglobin: 14.7 g/dL (ref 13.7–17.5)
MCV: 86 fL (ref 79–92)
Platelets: 199 10*3/uL (ref 150–330)
RBC: 5 MIL/uL (ref 4.6–6.1)
RDW: 13.3 % (ref 11.6–14.4)
WBC: 8.6 10*3/uL (ref 4.2–9.1)

## 2013-04-07 LAB — ALT: ALT: 44 U/L (ref 0–50)

## 2013-04-07 MED ORDER — TRAZODONE HCL 50 MG PO TABS *I*
50.0000 mg | ORAL_TABLET | Freq: Every evening | ORAL | Status: DC
Start: 2013-04-07 — End: 2014-03-24

## 2013-04-07 NOTE — Progress Notes (Signed)
Subjective: DM     Patient ID: Noah Craig is a 47 y.o. male    HPI:     DM: He is taking his medication daily. No tingling or numbness in feet. No change in vision. He is back on Lantus 10 units nightly after his knee surgery. He is gradually working on increasing his activity.     Hyperlipidemia: He is following diet and exercise, not taking any medication. No muscle ache. No CP or SOB.    Hypertension: He is following diet and exercise, no CP or SOB.    Elevated LFT: He is following diet and exercise, He lost some weight, no abdominal pain.    Insomnia; C/O insomnia for few weeks, it started after his knee surgery and when he was on oxycodone, he is weaning them down but now having difficulty falling sleep.     Nonsmoker     Patient's medications, allergies were reviewed and updated as appropriate.    Review of Systems     Per HPI     Objective:   Physical Exam     Eye: No pallor or icterus  Heart: S1-S2 normal no murmur detected, no carotid bruit  Lung: Breath sounds are bilaterally equal  Abdomen: Soft nontender bowel sounds present  Extremities: No edema  Neuro: Non focal     Plan:      DM: well controlled, continue current meds. Check HBA1C today    HTN: well controlled, continue diet and exercise    HYPERLIPIDEMIA: LDL at goal. HDL below goal. TGD at goal. Advise diet and exercise.    Elevated LFT: Due to fatty liver, Encourage to continue diet and weight control. Check hepatitis profile today    Insomnia; discussed with patient, advise to start Trazodone, he has taken it in past. Advise to completely taper off narcotics.     HM: colonoscopy up to date, advise to go for eye exam for diabetes    F/U in 3 months.

## 2013-04-08 LAB — HEMOGLOBIN A1C: Hemoglobin A1C: 6.7 % — ABNORMAL HIGH (ref 4.0–6.0)

## 2013-04-09 ENCOUNTER — Ambulatory Visit: Payer: Self-pay | Admitting: Primary Care

## 2013-04-09 LAB — VITAMIN D
25-OH VIT D2: <4 ng/mL
25-OH VIT D3: 21 ng/mL
25-OH Vit Total: 21 ng/mL — ABNORMAL LOW (ref 30–60)

## 2013-04-09 MED ORDER — VITAMIN D (ERGOCALCIFEROL) 50000 UNITS PO CAPS
50000.0000 [IU] | ORAL_CAPSULE | ORAL | Status: DC
Start: 2013-04-09 — End: 2013-07-13

## 2013-04-10 ENCOUNTER — Telehealth: Payer: Self-pay | Admitting: Primary Care

## 2013-04-10 NOTE — Telephone Encounter (Signed)
Pt informed he has elevated sugar, but HBA1C is good.  Continue current meds, vit d is low @ 21, needs to take prescription strength vit d, which is at the pharmacy.  Sleeping pill is working great.  Has had 3 good nights rest.  Thank you.

## 2013-04-10 NOTE — Telephone Encounter (Signed)
I left a message to call office back. 

## 2013-04-10 NOTE — Telephone Encounter (Signed)
i left a message to call office

## 2013-04-10 NOTE — Telephone Encounter (Signed)
Message copied by Lindajo Royal on Fri Apr 10, 2013  9:52 AM  ------       Message from: Minta Balsam       Created: Thu Apr 09, 2013 11:15 PM         Let him know that labs showed elevated sugar but HBA1C is 6.7 which is pretty good, continue current meds. Vit D is pretty low at 21, I am sending a prescription for high dose Vit D to increase his level and hopefully will make him feel better with fatigue etc. Hope his sleeping pill is helping. thanks  ------

## 2013-04-10 NOTE — Telephone Encounter (Signed)
Message copied by Lindajo Royal on Fri Apr 10, 2013  9:54 AM  ------       Message from: Minta Balsam       Created: Thu Apr 09, 2013 11:15 PM         Let him know that labs showed elevated sugar but HBA1C is 6.7 which is pretty good, continue current meds. Vit D is pretty low at 21, I am sending a prescription for high dose Vit D to increase his level and hopefully will make him feel better with fatigue etc. Hope his sleeping pill is helping. thanks  ------

## 2013-06-05 ENCOUNTER — Other Ambulatory Visit: Payer: Self-pay | Admitting: Primary Care

## 2013-06-24 LAB — HM DIABETES EYE EXAM

## 2013-06-25 ENCOUNTER — Encounter: Payer: Self-pay | Admitting: Primary Care

## 2013-06-29 ENCOUNTER — Encounter: Payer: Self-pay | Admitting: Gastroenterology

## 2013-07-07 ENCOUNTER — Other Ambulatory Visit: Payer: Self-pay | Admitting: Primary Care

## 2013-07-13 ENCOUNTER — Ambulatory Visit: Payer: Self-pay | Admitting: Primary Care

## 2013-07-13 ENCOUNTER — Encounter: Payer: Self-pay | Admitting: Primary Care

## 2013-07-13 VITALS — BP 130/70 | HR 82 | Ht 74.0 in | Wt 277.0 lb

## 2013-07-13 DIAGNOSIS — I10 Essential (primary) hypertension: Secondary | ICD-10-CM

## 2013-07-13 DIAGNOSIS — E559 Vitamin D deficiency, unspecified: Secondary | ICD-10-CM

## 2013-07-13 DIAGNOSIS — E785 Hyperlipidemia, unspecified: Secondary | ICD-10-CM

## 2013-07-13 DIAGNOSIS — E119 Type 2 diabetes mellitus without complications: Secondary | ICD-10-CM

## 2013-07-13 NOTE — Progress Notes (Signed)
Subjective: DM     Patient ID: Noah Craig is a 47 y.o. male    HPI:     DM: He is taking his medication daily. No tingling or numbness in feet. No change in vision. He is on Lantus 13 units nightly. His fasting sugar are about 160.     Hyperlipidemia: He is following diet and exercise, not taking any medication. No muscle ache. No CP or SOB.    Hypertension: He is following diet and exercise, no CP or SOB.    Elevated LFT: He is following diet and exercise, He lost some weight, no abdominal pain.    Nonsmoker     Patient's medications, allergies were reviewed and updated as appropriate.    Review of Systems     Per HPI     Objective:   Physical Exam     Eye: No pallor or icterus  Heart: S1-S2 normal no murmur detected, no carotid bruit  Lung: Breath sounds are bilaterally equal  Abdomen: Soft nontender bowel sounds present  Extremities: No edema  Neuro: Non focal     Plan:      DM: Well controlled, continue current meds. Check HBA1C today.    HTN: well controlled, continue diet and exercise.    HYPERLIPIDEMIA: LDL at goal. HDL below goal. TGD at goal. Advise diet and exercise. Recheck lipid profile.     Elevated LFT: Due to fatty liver, last liver function were normal. Monitor for now.    Vit D deficiency: he took 4 capsule of vit D. Recheck Vit D level.     HM: Colonoscopy up to date, vaccination up to date.     F/U in 3 months.

## 2013-07-14 ENCOUNTER — Ambulatory Visit
Admit: 2013-07-14 | Discharge: 2013-07-14 | Disposition: A | Discharge status: Home / Self Care | Payer: Self-pay | Source: Ambulatory Visit | Attending: Primary Care | Admitting: Primary Care

## 2013-07-14 DIAGNOSIS — E785 Hyperlipidemia, unspecified: Secondary | ICD-10-CM

## 2013-07-14 LAB — BASIC METABOLIC PANEL
Anion Gap: 8 (ref 7–16)
CO2: 25 mmol/L (ref 20–28)
Calcium: 9.5 mg/dL (ref 8.6–10.2)
Chloride: 106 mmol/L (ref 96–108)
Creatinine: 1.06 mg/dL (ref 0.67–1.17)
GFR,Black: 97 *
GFR,Caucasian: 84 *
Glucose: 191 mg/dL — ABNORMAL HIGH (ref 60–99)
Lab: 21 mg/dL — ABNORMAL HIGH (ref 6–20)
Potassium: 4.6 mmol/L (ref 3.3–5.1)
Sodium: 139 mmol/L (ref 133–145)

## 2013-07-14 LAB — CBC
Hematocrit: 43 % (ref 40–51)
Hemoglobin: 15 g/dL (ref 13.7–17.5)
MCV: 84 fL (ref 79–92)
Platelets: 172 10*3/uL (ref 150–330)
RBC: 5.1 MIL/uL (ref 4.6–6.1)
RDW: 13.9 % (ref 11.6–14.4)
WBC: 7.2 10*3/uL (ref 4.2–9.1)

## 2013-07-14 LAB — LIPID PANEL
Chol/HDL Ratio: 2.6
Cholesterol: 89 mg/dL
HDL: 34 mg/dL
LDL Calculated: 45 mg/dL
Non HDL Cholesterol: 55 mg/dL
Triglycerides: 48 mg/dL

## 2013-07-14 LAB — HEMOGLOBIN A1C: Hemoglobin A1C: 7.8 % — ABNORMAL HIGH (ref 4.0–6.0)

## 2013-07-14 LAB — ALT: ALT: 55 U/L — ABNORMAL HIGH (ref 0–50)

## 2013-07-15 ENCOUNTER — Telehealth: Payer: Self-pay | Admitting: Primary Care

## 2013-07-15 NOTE — Telephone Encounter (Signed)
Message copied by Duane Lope on Wed Jul 15, 2013  8:51 AM  ------       Message from: Minta Balsam       Created: Tue Jul 14, 2013 11:36 PM         Let him know that sugar not well controlled, HBA1C is 7.8, goal is below 7 and he is always in 6 range, this is his highest number, advise diet, exercise and weight loss and increase lantus by 4 units every 3rd day until fasting sugar is below 120.  ------

## 2013-07-15 NOTE — Telephone Encounter (Signed)
Pt informed:  Sugar is not well controlled, HBA1C is 7.8, goal is below 7 and he is always in 6 range, this is his highest number, advised diet, exercise and weight loss and increase lantus by 4 units every 3rd day until fasting sugar is below 120.

## 2013-07-15 NOTE — Telephone Encounter (Signed)
Lmtcb/ td

## 2013-07-16 LAB — VITAMIN D
25-OH VIT D2: 21 ng/mL
25-OH VIT D3: 25 ng/mL
25-OH Vit Total: 46 ng/mL (ref 30–60)

## 2013-07-27 ENCOUNTER — Other Ambulatory Visit: Payer: Self-pay | Admitting: Primary Care

## 2013-08-20 ENCOUNTER — Telehealth: Payer: Self-pay | Admitting: Primary Care

## 2013-08-20 NOTE — Telephone Encounter (Signed)
Currently taking 27 units at night, his numbers have been 135-145 for 4-5 days.  Can he increase to 30-33 units at night?

## 2013-08-20 NOTE — Telephone Encounter (Signed)
Patient informed may increase by 2 units at a time and wait for 3 days before increasing again.

## 2013-08-20 NOTE — Telephone Encounter (Signed)
Increase by 2 unit at a time and wait for 3 days before increasing again. thanks

## 2013-10-12 ENCOUNTER — Ambulatory Visit
Admit: 2013-10-12 | Discharge: 2013-10-12 | Disposition: A | Discharge status: Home / Self Care | Payer: Self-pay | Source: Ambulatory Visit | Attending: Primary Care | Admitting: Primary Care

## 2013-10-12 ENCOUNTER — Other Ambulatory Visit: Payer: Self-pay | Admitting: Primary Care

## 2013-10-12 DIAGNOSIS — E119 Type 2 diabetes mellitus without complications: Secondary | ICD-10-CM

## 2013-10-12 LAB — BASIC METABOLIC PANEL
Anion Gap: 8 (ref 7–16)
CO2: 27 mmol/L (ref 20–28)
Calcium: 9.1 mg/dL (ref 8.6–10.2)
Chloride: 102 mmol/L (ref 96–108)
Creatinine: 1 mg/dL (ref 0.67–1.17)
GFR,Black: 104 *
GFR,Caucasian: 90 *
Glucose: 226 mg/dL — ABNORMAL HIGH (ref 60–99)
Lab: 17 mg/dL (ref 6–20)
Potassium: 4.2 mmol/L (ref 3.3–5.1)
Sodium: 137 mmol/L (ref 133–145)

## 2013-10-12 LAB — CBC
Hematocrit: 45 % (ref 40–51)
Hemoglobin: 16.4 g/dL (ref 13.7–17.5)
MCV: 85 fL (ref 79–92)
Platelets: 176 10*3/uL (ref 150–330)
RBC: 5.3 MIL/uL (ref 4.6–6.1)
RDW: 13.1 % (ref 11.6–14.4)
WBC: 7.2 10*3/uL (ref 4.2–9.1)

## 2013-10-12 LAB — HEMOGLOBIN A1C: Hemoglobin A1C: 7.4 % — ABNORMAL HIGH (ref 4.0–6.0)

## 2013-10-12 LAB — ALT: ALT: 61 U/L — ABNORMAL HIGH (ref 0–50)

## 2013-10-13 LAB — MICROALBUMIN, URINE, RANDOM
Creatinine,UR: 162 mg/dL (ref 20–300)
Microalb/Creat Ratio: 8.8 mg MA/g CR (ref 0.0–29.9)
Microalbumin,UR: 1.42 mg/dL

## 2013-10-15 ENCOUNTER — Ambulatory Visit: Payer: Self-pay | Admitting: Primary Care

## 2013-10-15 ENCOUNTER — Encounter: Payer: Self-pay | Admitting: Primary Care

## 2013-10-15 VITALS — BP 118/80 | HR 82 | Ht 74.0 in | Wt 289.0 lb

## 2013-10-15 DIAGNOSIS — I10 Essential (primary) hypertension: Secondary | ICD-10-CM

## 2013-10-15 DIAGNOSIS — E119 Type 2 diabetes mellitus without complications: Secondary | ICD-10-CM

## 2013-10-15 DIAGNOSIS — Z23 Encounter for immunization: Secondary | ICD-10-CM

## 2013-10-15 DIAGNOSIS — E785 Hyperlipidemia, unspecified: Secondary | ICD-10-CM

## 2013-10-15 LAB — POCT GLUCOSE: Glucose POCT: 245 mg/dL — ABNORMAL HIGH (ref 60–99)

## 2013-10-15 NOTE — Progress Notes (Signed)
Subjective: DM     Patient ID: Noah Craig is a 47 y.o. male    HPI:     DM: He is taking his medication daily. No tingling or numbness in feet. No change in vision. He is on Lantus 30 units nightly. His fasting sugar are high and running in 200's. He eat his dinner at 5:30 to 6:00 PM and take Lantus at 9:00 PM, he eat a small bedtime snack.     Hyperlipidemia: He is following diet and exercise, not taking any medication. No muscle ache. No CP or SOB.    Hypertension: He is following diet and exercise, no CP or SOB.    Elevated LFT: He gained some weight, no abdominal pain.    Non smoker     Patient's medications, allergies were reviewed and updated as appropriate.    Review of Systems     Per HPI     Objective:   Physical Exam     Eye: No pallor or icterus  Heart: S1-S2 normal no murmur detected, no carotid bruit  Lung: Breath sounds are bilaterally equal  Abdomen: Soft nontender bowel sounds present  Extremities: No edema  Neuro: Non focal     Plan:      DM: HBA1C above goal, continue current meds. Advise to take Lantus with dinner at night, he is typically getting Dawn phenomenon which is causing his high sugar in AM, Check HBA1C in 6 weeks.     HTN: well controlled, continue diet and exercise.    HYPERLIPIDEMIA: LDL at goal. HDL below goal. TGD at goal. Advise diet and exercise.     Elevated LFT: Due to fatty liver, Monitor for now.    Vit D deficiency: normal, continue replacement.     HM: Colonoscopy up to date, vaccination up to date.     F/U in 3 months.

## 2013-11-13 ENCOUNTER — Other Ambulatory Visit: Payer: Self-pay | Admitting: Primary Care

## 2013-11-23 ENCOUNTER — Ambulatory Visit: Payer: Self-pay | Admitting: Primary Care

## 2013-11-23 ENCOUNTER — Encounter: Payer: Self-pay | Admitting: Primary Care

## 2013-11-23 VITALS — BP 130/80 | HR 90 | Ht 74.0 in | Wt 289.0 lb

## 2013-11-23 DIAGNOSIS — E119 Type 2 diabetes mellitus without complications: Secondary | ICD-10-CM

## 2013-11-23 LAB — HEMOGLOBIN A1C: Hemoglobin A1C: 6.8 % — ABNORMAL HIGH (ref 4.0–6.0)

## 2013-11-23 NOTE — Progress Notes (Signed)
Subjective: DM     Patient ID: Noah Craig is a 47 y.o. male    HPI:     DM: He is taking his medication daily. No tingling or numbness in feet. No change in vision. He stopped taking Lantus about 2.5 weeks ago. He is checking his fasting sugar and run from 110 to 160. He is eating his dinner at 6:00 PM and eating very light snack at 9:00 PM.     Non smoker     Patient's medications, allergies were reviewed and updated as appropriate.    Review of Systems     Per HPI     Objective:   Physical Exam     Eye: No pallor or icterus  Heart: S1-S2 normal no murmur detected, no carotid bruit  Lung: Breath sounds are bilaterally equal  Abdomen: Soft nontender bowel sounds present  Extremities: No edema  Neuro: Non focal     Plan:      DM: HBA1C above goal, await new HBAIC, discuss to continue current medication. Discuss to add activity level after dinner to see if that can bring down sugar in morning, he will monitor for now, off insulin and go from there, further management based on HBA1C finding today.     F/U in 3 months.

## 2013-11-24 ENCOUNTER — Telehealth: Payer: Self-pay | Admitting: Primary Care

## 2013-11-24 NOTE — Telephone Encounter (Signed)
Message copied by Duane Lope on Tue Nov 24, 2013  9:30 AM  ------       Message from: Minta Balsam       Created: Mon Nov 23, 2013 11:26 PM         HBA1C is down to 6.8 from 7.4, current regimen is working, no change in medication. May try to increase activity level after dinner. thanks  ------

## 2013-11-24 NOTE — Telephone Encounter (Signed)
Spoke to patient and gave him the results as noted below.

## 2014-01-25 ENCOUNTER — Encounter: Payer: Self-pay | Admitting: Primary Care

## 2014-01-25 ENCOUNTER — Encounter: Payer: Self-pay | Admitting: Gastroenterology

## 2014-01-25 LAB — HM DIABETES EYE EXAM

## 2014-02-15 ENCOUNTER — Ambulatory Visit: Payer: Self-pay | Admitting: Primary Care

## 2014-02-17 ENCOUNTER — Other Ambulatory Visit: Payer: Self-pay | Admitting: Primary Care

## 2014-02-23 ENCOUNTER — Ambulatory Visit: Payer: Self-pay | Admitting: Primary Care

## 2014-02-23 ENCOUNTER — Encounter: Payer: Self-pay | Admitting: Primary Care

## 2014-02-23 VITALS — BP 158/80 | HR 67 | Wt 285.0 lb

## 2014-02-23 DIAGNOSIS — M545 Low back pain, unspecified: Secondary | ICD-10-CM

## 2014-02-23 MED ORDER — CYCLOBENZAPRINE HCL 10 MG PO TABS *I*
10.0000 mg | ORAL_TABLET | Freq: Two times a day (BID) | ORAL | Status: DC | PRN
Start: 2014-02-23 — End: 2014-10-01

## 2014-02-23 NOTE — Progress Notes (Signed)
Subjective: back pain     Patient ID: Noah Craig is a 48 y.o. male.    HPI     Back pain: He is here for evaluation. C/O low back pain since yesterday, started suddenly, pain is dull 10/10 and radiate to right hip. No leg weakness, no bowel or bladder incontinence. No urinary symptoms. + stiffness. No injury. No H/O similar complaints in past. He took 2 Aleve once a day.     Non smoker    Patient's medications, allergies were reviewed and updated as appropriate.    Review of Systems     per HPI    Objective:   Physical Exam  Gen: NAD  Back: tenderness over lower back on right side, right paraspinal muscle spasm  Neuro: No focal deficit    Plan:      Low back pain: discussed at length, advised to continue Aleve 2 tab BID, start Ice twice a day, flexeril BID, prescription done. Advised gentle stretching. Avoid lifting weight. Call back if no improvement in 2 days, pt reassured, spent greater than 25 min, more than 50% time spent in counseling and education.

## 2014-03-01 ENCOUNTER — Encounter: Payer: Self-pay | Admitting: Gastroenterology

## 2014-03-23 ENCOUNTER — Ambulatory Visit: Payer: Self-pay | Admitting: Primary Care

## 2014-03-23 ENCOUNTER — Ambulatory Visit
Admit: 2014-03-23 | Discharge: 2014-03-23 | Disposition: A | Discharge status: Home / Self Care | Payer: Self-pay | Source: Ambulatory Visit | Attending: Primary Care | Admitting: Primary Care

## 2014-03-23 DIAGNOSIS — E119 Type 2 diabetes mellitus without complications: Secondary | ICD-10-CM

## 2014-03-23 LAB — CBC
Hematocrit: 46 % (ref 40–51)
Hemoglobin: 16.5 g/dL (ref 13.7–17.5)
MCH: 31 pg/cell (ref 26–32)
MCHC: 36 g/dL (ref 32–37)
MCV: 85 fL (ref 79–92)
Platelets: 181 10*3/uL (ref 150–330)
RBC: 5.4 MIL/uL (ref 4.6–6.1)
RDW: 13.5 % (ref 11.6–14.4)
WBC: 8.2 10*3/uL (ref 4.2–9.1)

## 2014-03-23 LAB — BASIC METABOLIC PANEL
Anion Gap: 14 (ref 7–16)
CO2: 27 mmol/L (ref 20–28)
Calcium: 9.5 mg/dL (ref 8.6–10.2)
Chloride: 99 mmol/L (ref 96–108)
Creatinine: 1.01 mg/dL (ref 0.67–1.17)
GFR,Black: 102 *
GFR,Caucasian: 88 *
Glucose: 175 mg/dL — ABNORMAL HIGH (ref 60–99)
Lab: 15 mg/dL (ref 6–20)
Potassium: 4.5 mmol/L (ref 3.3–5.1)
Sodium: 140 mmol/L (ref 133–145)

## 2014-03-23 LAB — ALT: ALT: 75 U/L — ABNORMAL HIGH (ref 0–50)

## 2014-03-24 ENCOUNTER — Ambulatory Visit: Payer: Self-pay | Admitting: Primary Care

## 2014-03-24 ENCOUNTER — Encounter: Payer: Self-pay | Admitting: Primary Care

## 2014-03-24 VITALS — BP 120/80 | HR 96 | Ht 73.0 in | Wt 280.0 lb

## 2014-03-24 DIAGNOSIS — E785 Hyperlipidemia, unspecified: Secondary | ICD-10-CM

## 2014-03-24 DIAGNOSIS — E119 Type 2 diabetes mellitus without complications: Secondary | ICD-10-CM

## 2014-03-24 DIAGNOSIS — R7989 Other specified abnormal findings of blood chemistry: Secondary | ICD-10-CM

## 2014-03-24 DIAGNOSIS — E559 Vitamin D deficiency, unspecified: Secondary | ICD-10-CM

## 2014-03-24 DIAGNOSIS — I10 Essential (primary) hypertension: Secondary | ICD-10-CM

## 2014-03-24 LAB — HM DIABETES FOOT EXAM

## 2014-03-24 LAB — HEMOGLOBIN A1C: Hemoglobin A1C: 8.3 % — ABNORMAL HIGH (ref 4.0–6.0)

## 2014-03-24 NOTE — Progress Notes (Signed)
Subjective: DM     Patient ID: Noah Craig is a 48 y.o. male    HPI:     DM: He is taking his medication daily. No tingling or numbness in feet. No change in vision. He is not taking Lantus any longer. His fasting sugar are high and running in 200's. He eat his dinner at 5:30 to 6:00 PM daily.     Hyperlipidemia: He is following diet and exercise, not taking any medication. No muscle ache. No CP or SOB.    Hypertension: He is following diet and exercise, no CP or SOB.    Elevated LFT: He lost some weight, no abdominal pain.    Left hand pain: it is on and off for last few days, + tingling, no weakness.     Non smoker     Patient's medications, allergies were reviewed and updated as appropriate.    Review of Systems     Per HPI     Objective:   Physical Exam     Eye: No pallor or icterus  Heart: S1-S2 normal no murmur detected, no carotid bruit  Lung: Breath sounds are bilaterally equal  Abdomen: Soft nontender bowel sounds present  Extremities: No edema, left hand with normal pulsation. + Phalen's test  Neuro: Non focal  Feet: normal sensation both feet.     Plan:      DM: HBA1C Above goal, continue current meds. Resume lantus at 10 units and increase by 4 units every 3rd day until sugar are below 140. Recheck HBA1C in 1 month.     HTN: well controlled, continue diet and exercise.    HYPERLIPIDEMIA: LDL at goal. HDL below goal. TGD at goal. Advise diet and exercise.     Elevated LFT: Due to fatty liver, Monitor for now.    Vit D deficiency: Normal, continue replacement.     Left hand pain; due to carpel tunnel syndrome, discussed splint, call back if no improvement.     HM: Colonoscopy up to date, vaccination up to date.     F/U in 3 months.

## 2014-03-25 ENCOUNTER — Other Ambulatory Visit: Payer: Self-pay | Admitting: Primary Care

## 2014-03-27 ENCOUNTER — Other Ambulatory Visit: Payer: Self-pay | Admitting: Primary Care

## 2014-04-17 ENCOUNTER — Other Ambulatory Visit: Payer: Self-pay | Admitting: Primary Care

## 2014-04-30 ENCOUNTER — Encounter: Payer: Self-pay | Admitting: Gastroenterology

## 2014-06-22 ENCOUNTER — Ambulatory Visit
Admit: 2014-06-22 | Discharge: 2014-06-22 | Disposition: A | Discharge status: Home / Self Care | Payer: Self-pay | Source: Ambulatory Visit | Attending: Primary Care | Admitting: Primary Care

## 2014-06-22 DIAGNOSIS — E785 Hyperlipidemia, unspecified: Secondary | ICD-10-CM

## 2014-06-22 DIAGNOSIS — E119 Type 2 diabetes mellitus without complications: Secondary | ICD-10-CM

## 2014-06-22 LAB — LIPID PANEL
Chol/HDL Ratio: 2.7
Cholesterol: 97 mg/dL
HDL: 36 mg/dL
LDL Calculated: 52 mg/dL
Non HDL Cholesterol: 61 mg/dL
Triglycerides: 43 mg/dL

## 2014-06-22 LAB — CBC
Hematocrit: 45 % (ref 40–51)
Hemoglobin: 15.9 g/dL (ref 13.7–17.5)
MCH: 31 pg/cell (ref 26–32)
MCHC: 35 g/dL (ref 32–37)
MCV: 88 fL (ref 79–92)
Platelets: 175 10*3/uL (ref 150–330)
RBC: 5.2 MIL/uL (ref 4.6–6.1)
RDW: 13.3 % (ref 11.6–14.4)
WBC: 8.5 10*3/uL (ref 4.2–9.1)

## 2014-06-22 LAB — BASIC METABOLIC PANEL
Anion Gap: 13 (ref 7–16)
CO2: 24 mmol/L (ref 20–28)
Calcium: 8.8 mg/dL (ref 8.6–10.2)
Chloride: 102 mmol/L (ref 96–108)
Creatinine: 1.12 mg/dL (ref 0.67–1.17)
GFR,Black: 90 *
GFR,Caucasian: 78 *
Glucose: 157 mg/dL — ABNORMAL HIGH (ref 60–99)
Lab: 24 mg/dL — ABNORMAL HIGH (ref 6–20)
Potassium: 5 mmol/L (ref 3.3–5.1)
Sodium: 139 mmol/L (ref 133–145)

## 2014-06-22 LAB — HEMOGLOBIN A1C: Hemoglobin A1C: 7.3 % — ABNORMAL HIGH (ref 4.0–6.0)

## 2014-06-22 LAB — ALT: ALT: 62 U/L — ABNORMAL HIGH (ref 0–50)

## 2014-06-24 ENCOUNTER — Encounter: Payer: Self-pay | Admitting: Primary Care

## 2014-06-24 ENCOUNTER — Ambulatory Visit: Payer: Self-pay | Admitting: Primary Care

## 2014-06-24 VITALS — BP 120/70 | HR 72 | Ht 73.0 in | Wt 273.0 lb

## 2014-06-24 DIAGNOSIS — E119 Type 2 diabetes mellitus without complications: Secondary | ICD-10-CM

## 2014-06-24 DIAGNOSIS — E785 Hyperlipidemia, unspecified: Secondary | ICD-10-CM

## 2014-06-24 DIAGNOSIS — I10 Essential (primary) hypertension: Secondary | ICD-10-CM

## 2014-06-24 NOTE — Progress Notes (Signed)
Subjective: DM     Patient ID: Noah Craig is a 48 y.o. male    HPI:     DM: He is taking his medication daily. No tingling or numbness in feet. No change in vision. He is  taking Lantus but not every day, typically 4-5 days a week.     Hyperlipidemia: He is following diet and exercise, not taking any medication. No muscle ache. No CP or SOB.    Hypertension: He is following diet and exercise, no CP or SOB.    Elevated LFT: He lost some weight, no abdominal pain.    Non smoker     Patient's medications, allergies were reviewed and updated as appropriate.    Review of Systems     Per HPI     Objective:   Physical Exam     Eye: No pallor or icterus  Heart: S1-S2 normal no murmur detected, no carotid bruit  Lung: Breath sounds are bilaterally equal  Abdomen: Soft nontender bowel sounds present  Extremities: No edema,  Neuro: Non focal     Plan:      DM: HBA1C Above goal but improving, continue current meds. discussed goal of below 7 with patient.  Advise to lantus daily. Encourage to loose more weight. Repeat in 3 months.     HTN: well controlled, continue diet and exercise.    HYPERLIPIDEMIA: LDL at goal. HDL below goal. TGD at goal. Advise diet and exercise.     Elevated LFT: improving, due to fatty liver, Monitor for now.    Vit D deficiency: Normal, continue replacement.     HM: Colonoscopy up to date, vaccination up to date.     F/U in 3 months.

## 2014-06-28 ENCOUNTER — Other Ambulatory Visit: Payer: Self-pay | Admitting: Primary Care

## 2014-07-16 ENCOUNTER — Encounter: Payer: Self-pay | Admitting: Gastroenterology

## 2014-07-20 ENCOUNTER — Encounter: Payer: Self-pay | Admitting: Emergency Medicine

## 2014-07-20 ENCOUNTER — Other Ambulatory Visit: Payer: Self-pay | Admitting: Gastroenterology

## 2014-07-20 ENCOUNTER — Emergency Department
Admission: EM | Admit: 2014-07-20 | Disposition: A | Discharge status: Home / Self Care | Payer: Self-pay | Source: Ambulatory Visit | Attending: Emergency Medicine | Admitting: Emergency Medicine

## 2014-07-20 ENCOUNTER — Ambulatory Visit: Payer: Self-pay | Admitting: Primary Care

## 2014-07-20 ENCOUNTER — Encounter: Payer: Self-pay | Admitting: Primary Care

## 2014-07-20 VITALS — BP 116/82 | HR 92 | Ht 73.0 in | Wt 262.0 lb

## 2014-07-20 DIAGNOSIS — K5900 Constipation, unspecified: Secondary | ICD-10-CM

## 2014-07-20 DIAGNOSIS — R5383 Other fatigue: Secondary | ICD-10-CM

## 2014-07-20 DIAGNOSIS — D649 Anemia, unspecified: Secondary | ICD-10-CM

## 2014-07-20 DIAGNOSIS — M549 Dorsalgia, unspecified: Secondary | ICD-10-CM

## 2014-07-20 DIAGNOSIS — R0602 Shortness of breath: Secondary | ICD-10-CM

## 2014-07-20 LAB — RUQ PANEL (ED ONLY)
ALT: 33 U/L (ref 0–50)
AST: 23 U/L (ref 0–50)
Albumin: 4.1 g/dL (ref 3.5–5.2)
Alk Phos: 94 U/L (ref 40–130)
Amylase: 53 U/L (ref 28–100)
Bilirubin,Direct: 0.3 mg/dL (ref 0.0–0.3)
Bilirubin,Total: 0.7 mg/dL (ref 0.0–1.2)
Lipase: 32 U/L (ref 13–60)
Total Protein: 7 g/dL (ref 6.3–7.7)

## 2014-07-20 LAB — CBC AND DIFFERENTIAL
Baso # K/uL: 0 10*3/uL (ref 0.0–0.1)
Basophil %: 0.1 % — ABNORMAL LOW (ref 0.2–1.2)
Eos # K/uL: 0.2 10*3/uL (ref 0.0–0.5)
Eosinophil %: 2.2 % (ref 0.8–7.0)
Hematocrit: 34 % — ABNORMAL LOW (ref 40–51)
Hemoglobin: 11.4 g/dL — ABNORMAL LOW (ref 13.7–17.5)
Lymph # K/uL: 2 10*3/uL (ref 1.3–3.6)
Lymphocyte %: 21 % — ABNORMAL LOW (ref 21.8–53.1)
MCH: 29 pg/cell (ref 26–32)
MCHC: 34 g/dL (ref 32–37)
MCV: 87 fL (ref 79–92)
Mono # K/uL: 0.7 10*3/uL (ref 0.3–0.8)
Monocyte %: 7.5 % (ref 5.3–12.2)
Neut # K/uL: 6.4 10*3/uL — ABNORMAL HIGH (ref 1.8–5.4)
Platelets: 341 10*3/uL — ABNORMAL HIGH (ref 150–330)
RBC: 3.9 MIL/uL — ABNORMAL LOW (ref 4.6–6.1)
RDW: 13.8 % (ref 11.6–14.4)
Seg Neut %: 69.2 % — ABNORMAL HIGH (ref 34.0–67.9)
WBC: 9.3 10*3/uL — ABNORMAL HIGH (ref 4.2–9.1)

## 2014-07-20 LAB — PLASMA PROF 7 (ED ONLY)
Anion Gap,PL: 15 (ref 7–16)
CO2,Plasma: 24 mmol/L (ref 20–28)
Chloride,Plasma: 96 mmol/L (ref 96–108)
Creatinine: 1.06 mg/dL (ref 0.67–1.17)
GFR,Black: 96 *
GFR,Caucasian: 83 *
Glucose,Plasma: 239 mg/dL — ABNORMAL HIGH (ref 60–99)
Potassium,Plasma: 4.5 mmol/L (ref 3.4–4.7)
Sodium,Plasma: 135 mmol/L (ref 132–146)
UN,Plasma: 19 mg/dL (ref 6–20)

## 2014-07-20 LAB — POCT GLUCOSE: Glucose POCT: 157 mg/dL — ABNORMAL HIGH (ref 60–99)

## 2014-07-20 LAB — NT-PRO BNP: NT-pro BNP: <50 pg/mL (ref 0–450)

## 2014-07-20 LAB — TYPE AND SCREEN
ABO RH Blood Type: O POS
Antibody Screen: NEGATIVE

## 2014-07-20 LAB — TROPONIN T: Troponin T: <0.01 ng/mL (ref 0.00–0.02)

## 2014-07-20 LAB — TSH: TSH: 1.68 u[IU]/mL (ref 0.27–4.20)

## 2014-07-20 LAB — HOLD BLUE

## 2014-07-20 LAB — HOLD SST

## 2014-07-20 MED ORDER — KETOROLAC TROMETHAMINE 60 MG/2ML IM SOLN *I*
60.0000 mg | Freq: Once | INTRAMUSCULAR | Status: AC
Start: 2014-07-20 — End: 2014-07-20
  Administered 2014-07-20: 60 mg via INTRAMUSCULAR

## 2014-07-20 MED ORDER — MORPHINE SULFATE 4 MG/ML IJ SOLN
4.0000 mg | Freq: Once | INTRAMUSCULAR | Status: AC
Start: 2014-07-20 — End: 2014-07-20
  Administered 2014-07-20: 4 mg via INTRAVENOUS
  Filled 2014-07-20: qty 1

## 2014-07-20 MED ORDER — ASPIRIN 81 MG PO CHEW *I*
324.0000 mg | CHEWABLE_TABLET | Freq: Once | ORAL | Status: AC
Start: 2014-07-20 — End: 2014-07-20
  Administered 2014-07-20: 324 mg via ORAL
  Filled 2014-07-20: qty 4

## 2014-07-20 MED ORDER — IOHEXOL 350 MG/ML (OMNIPAQUE) IV SOLN *I*
1.0000 mL | Freq: Once | INTRAVENOUS | Status: AC
Start: 2014-07-20 — End: 2014-07-20
  Administered 2014-07-20: 70 mL via INTRAVENOUS

## 2014-07-20 MED ORDER — ONDANSETRON 4 MG PO TBDP *I*
4.0000 mg | ORAL_TABLET | Freq: Once | ORAL | Status: AC
Start: 2014-07-20 — End: 2014-07-20
  Administered 2014-07-20: 4 mg via ORAL
  Filled 2014-07-20: qty 1

## 2014-07-20 NOTE — ED Notes (Signed)
Patient total left knee replacement two weeks ago. Now with DOE after short distances. Office noted patient to have ST elevation in single lead (V2). EKG done in triage.

## 2014-07-20 NOTE — ED Provider Notes (Addendum)
History     Chief Complaint   Patient presents with    Shortness of Breath       HPI Comments: 48 yo male with pmh of diabetes presents to ED c/o fatigue.  He had L total knee replacement 2 weeks ago at [facility], was placed on xarelto after surgery, and has felt significant fatigue ever since.  He reports worsening dyspnea on exertion, prolonged sleeping, poor appetite.  His pcp told him his hct dropped from the 40's to 30's today.    He admits to subjective fever.  He denies chest pain, cough, orthopnea, abdominal pain, bloody stools, leg swelling, leg pain, discharge from surgical site, weakness, numbness, h/o dvt/pe.  He feels his knee his healing and feeling better every day.        History provided by:  Patient      Past Medical History   Diagnosis Date    Diabetes mellitus     Complication of anesthesia      slow to awaken    Colon polyp     Arthritis      knees, hands            Past Surgical History   Procedure Laterality Date    Knee surgery Bilateral      reconstruction; several surgeries    Tonsillectomy         Family History   Problem Relation Age of Onset    Heart disease Father          Social History      reports that he has quit smoking. He quit smokeless tobacco use about 22 years ago. He reports that he drinks alcohol. He reports that he does not use illicit drugs. His sexual activity history is not on file.    Living Situation    Questions Responses    Patient lives with Significant Other    Homeless No    Caregiver for other family member     External Services     Employment Employed    Domestic Violence Risk           Problem List     Patient Active Problem List   Diagnosis Code    Obesity 278.00    Hyperlipidemia 272.4    Diabetes mellitus 250.00    Elevated liver function tests 790.6    Hypertension 401.9    Vitamin D deficiency 268.9       Review of Systems   Review of Systems   Constitutional: Positive for fatigue.   Respiratory: Positive for shortness of breath.     Cardiovascular: Negative for chest pain.   Gastrointestinal: Negative for abdominal pain and blood in stool.   Musculoskeletal: Negative for joint swelling (knee improving).   Skin: Negative for color change.   Neurological: Negative for weakness and numbness.   Hematological: Bruises/bleeds easily (on xarelto ).   Psychiatric/Behavioral: Negative for confusion.       Physical Exam       ED Triage Vitals   BP Heart Rate Heart Rate(via Pulse Ox) Resp Temp Temp src SpO2 O2 Device O2 Flow Rate   07/20/14 1404 07/20/14 1404 -- 07/20/14 1404 07/20/14 1404 -- 07/20/14 1404 07/20/14 1404 07/20/14 1404   130/60 mmHg 80  18 36 C (96.8 F)  100 % Nasal cannula 4 L/min      Weight           07/20/14 1404  120.203 kg (265 lb)               Physical Exam   Constitutional: He is oriented to person, place, and time. He appears well-developed and well-nourished. No distress.   HENT:   Head: Normocephalic and atraumatic.   Mouth/Throat: Oropharynx is clear and moist.   Eyes: Conjunctivae are normal. Pupils are equal, round, and reactive to light. No scleral icterus.   Neck: Normal range of motion. Neck supple.   Cardiovascular: Normal rate, regular rhythm, normal heart sounds and intact distal pulses.  Exam reveals no gallop and no friction rub.    No murmur heard.  Pulmonary/Chest: Effort normal and breath sounds normal. No stridor. No respiratory distress. He has no wheezes. He has no rales.   Abdominal: Soft. Bowel sounds are normal. He   exhibits no distension. There is no tenderness. There is no rebound and no guarding.   Musculoskeletal: Normal range of motion.   L knee with edema, surgical incision clean, dry, intact; no purulent discharge.  No calf pain   Neurological: He is alert and oriented to person, place, and time.   Skin: Skin is warm and dry. He is not diaphoretic.   Psychiatric: He has a normal mood and affect.   Nursing note and vitals reviewed.      Medical Decision Making      Amount and/or Complexity  of Data Reviewed  Clinical lab tests: ordered  Tests in the radiology section of CPT: ordered        Initial Evaluation:  ED First Provider Contact    Date/Time Event User Comments    07/20/14 1404 ED Provider First Contact [facility], CYNTHIA M Initial Face to Face Provider Contact          Patient seen by me on arrival date of 07/20/2014 at 1450    Assessment:  48 y.o., male with diabetes and recent knee surgery 2 weeks ago on prophylactic xarelto comes to the ED with fatigue and dyspnea on exertion since surgery.  He was told his HCT has dropped on his recent pcp visit.    Differential Diagnosis includes anemia, PE, dehydration, electrolyte abnormality, thyroid dysfunction, liver dysfunction                Plan:   Cbc, bmp, bnp, tsh, troponin, ruq, type and screen  xr chest, cta chest, EKG  Disposition pending above, if negative will likely d/c home    Luiz Blare, MD              Luiz Blare, MD  07/20/14 1601          Resident Attestation:     Patient seen by me on arrival date of 07/20/2014 at 1556.    History:   I reviewed this patient, reviewed the resident's note and agree.    Exam:   I examined this patient, reviewed the resident's note and agree.    Decision Making:   I discussed with the resident his/her documented decision making  and agree.      Author Guss Bunde, MD      Guss Bunde, MD  07/21/14 [id number]

## 2014-07-20 NOTE — Progress Notes (Signed)
Subjective: fatigue     Patient ID: Noah Craig is a 48 y.o. male.    HPI he is brought in by Wife.    Fatigue; he is C/o feeling excessively tired lately, he had a knee replacement 2 weeks ago and had blood loss during surgery, he continue to get worse, + fatigue with little exertion.     Anemia: he was found to have a HCT of 29 after surgery, he is not taking any medication.    SOB: he started C/O SOB while here, no CP or back pain. No wheezing.     Back pain: he started having low back pain, he does have chronic back pain and now worse today, no radiation of pain. Pain was 9/10 while in room, it was dull.     Constipation: he is taking colace 100 mg daily, no BM in last few days, feel bloated    Non smoker    Patient's medications, allergies were reviewed and updated as appropriate.    Review of Systems     per HPI    Objective:   Physical Exam     Gen: + distress, appear pale  Heart; S1S2 tachy, no murmur  Lungs; clear, no wheezing or crackles  Ext; no edema  Back: tenderness over lower lumbar region     Plan:      Fatigue: due to recent surgery and blood loss, will monitor, discuss to drink plenty of liquids, discuss about 80 Oz of fluid a day.    Anemia; called FFT lab and go his CBC, last HCT on 7/17 was 30, it was a drop from 45, will start Iron replacement.     SOB: it started getting worse in room, RA sats were 93%, pt placed on 2 L oxygen. EKG done in room non specific ST-T changes in lead 3, reassurance given. 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     Back pain: he continue to have worsening pain in back, gave Toradol 60 mg IM in room for pain relief. Wife was rubbing his back. Pt laid on the exam table.     Constipation: discussed bowel regimen.     Pt symptoms continue to get worse in room, decision made to send him to ED for evaluation. Pt and wife agree, called 911 and [facility] called. Spent greater than 45 min in patient care time, discussed all the above issues at length and more than 50%   time spent in coordination of care.

## 2014-07-20 NOTE — Discharge Instructions (Signed)
Make appointment with your doctor for further testing of your anemia, and fatigue.  Return to the ED for fever, worsening, bleeding, or any concerns.

## 2014-07-20 NOTE — ED Notes (Signed)
Pt placed on travel monitor, off to imaging.

## 2014-07-20 NOTE — First Provider Contact (Deleted)
ED Medical Screening Exam Note    Initial provider evaluation performed by   ED First Provider Contact    Date/Time Event User Comments    07/20/14 1404 ED Provider First Contact [facility], Brylinn Teaney M Initial Face to Face Provider Contact          Right sided chest pain after fall injury    Orders placed:  Xray, analgesia, hold labs    Patient requires further evaluation.     Latta, NP, 07/20/2014, 2:04 PM

## 2014-07-20 NOTE — ED Provider Progress Notes (Signed)
ED Provider Progress Note    CTA negative, hemoccult negative.  Discussed importance of f/u with pcp for anemia, pt and wife agree with plan.       Luiz Blare, MD, 07/20/2014, 10:52 PM

## 2014-07-20 NOTE — ED Notes (Signed)
Reports SOB/dyspnea, weakness that has been going on since L knee surgery two weeks ago but acutely worse today. Pt pale, fatigued, DOE. L knee warm but no s/s infection, steri-strips intact with no drainage from incision site.  Pt also reports back pain, abdominal tenderness, and constipation since d/c from hospital.

## 2014-07-21 ENCOUNTER — Telehealth: Payer: Self-pay | Admitting: Primary Care

## 2014-07-21 NOTE — Telephone Encounter (Signed)
Wife, Noah Craig, given message below.

## 2014-07-21 NOTE — Telephone Encounter (Signed)
Take it easy, drink 80 Oz of fluid, start ferrous sulphate 325 mg twice a day, start colace 100 mg twice a day, senna 1 cap twice a day and miralax 17 gm twice a day for constipation. Call us back in 2 days. I reviewed all labs and anemia is getting better, it is 34 now and was 30 on July 17, thanks

## 2014-07-21 NOTE — Telephone Encounter (Signed)
D/C from [facility], all tests were normal except anemic.  You mentioned starting him on iron.  He is still extremely fatigued.   What is next step.  Please advise.

## 2014-07-23 NOTE — Telephone Encounter (Signed)
Wife called with an update. She said Noah Craig is still exhausted, but it is not the "all consuming" exhaustion he has been experiencing.  He is still stiff and sore.  He was not able to go to PT on Wednesday but is going to try and go this afternoon.

## 2014-07-23 NOTE — Telephone Encounter (Signed)
pts wife given message below

## 2014-07-23 NOTE — Telephone Encounter (Signed)
Good, stay the course, hopefully will improve slowly, will see him next week. thanks

## 2014-07-25 ENCOUNTER — Encounter: Payer: Self-pay | Admitting: Gastroenterology

## 2014-07-26 ENCOUNTER — Telehealth: Payer: Self-pay

## 2014-07-26 LAB — EKG 12-LEAD
P: -1 degrees
QRS: 27 degrees
Rate: 82 {beats}/min
Severity: NORMAL
Severity: NORMAL
T: 9 degrees

## 2014-07-26 NOTE — Telephone Encounter (Signed)
I spoke with Collie Siad in Medical Records at [facility]. She will fax the records to Dr Fredricka Bonine.

## 2014-07-26 NOTE — Telephone Encounter (Signed)
I called and spoke with Noah Craig to see how he is feeling after his [facility] ER visit this weekend for weakness, back pain and hyperglycemia. He states he had a bout of diarrhea this morning. He still feels tired but has an appetite and ate a bowl of cereal for breakfast. We discussed the importance of drinking 8 - 12 eight ounce glasses of fluid today and he said he is on his second bottle of water already. His fasting BG this morning was 141 which was much improved from yesterday. He is calling his orthopedic surgeon (Dr Bjorn Loser) to discuss possibly stopping his Xarelto. He will call us with any questions otherwise he will be in on July 31st to see Dr Fredricka Bonine.

## 2014-07-30 ENCOUNTER — Encounter: Payer: Self-pay | Admitting: Primary Care

## 2014-07-30 ENCOUNTER — Ambulatory Visit: Payer: Self-pay | Admitting: Primary Care

## 2014-07-30 VITALS — BP 130/80 | HR 82 | Ht 73.0 in | Wt 267.6 lb

## 2014-07-30 DIAGNOSIS — R5383 Other fatigue: Secondary | ICD-10-CM

## 2014-07-30 DIAGNOSIS — K5900 Constipation, unspecified: Secondary | ICD-10-CM

## 2014-07-30 DIAGNOSIS — D649 Anemia, unspecified: Secondary | ICD-10-CM

## 2014-07-30 DIAGNOSIS — E119 Type 2 diabetes mellitus without complications: Secondary | ICD-10-CM

## 2014-07-30 LAB — BASIC METABOLIC PANEL
Anion Gap: 12 (ref 7–16)
CO2: 25 mmol/L (ref 20–28)
Calcium: 9.2 mg/dL (ref 8.6–10.2)
Chloride: 98 mmol/L (ref 96–108)
Creatinine: 0.94 mg/dL (ref 0.67–1.17)
GFR,Black: 111 *
GFR,Caucasian: 96 *
Glucose: 291 mg/dL — ABNORMAL HIGH (ref 60–99)
Lab: 13 mg/dL (ref 6–20)
Potassium: 4.4 mmol/L (ref 3.3–5.1)
Sodium: 135 mmol/L (ref 133–145)

## 2014-07-30 LAB — CBC
Hematocrit: 38 % — ABNORMAL LOW (ref 40–51)
Hemoglobin: 12.7 g/dL — ABNORMAL LOW (ref 13.7–17.5)
MCH: 29 pg/cell (ref 26–32)
MCHC: 33 g/dL (ref 32–37)
MCV: 88 fL (ref 79–92)
Platelets: 251 10*3/uL (ref 150–330)
RBC: 4.3 MIL/uL — ABNORMAL LOW (ref 4.6–6.1)
RDW: 14.1 % (ref 11.6–14.4)
WBC: 6.2 10*3/uL (ref 4.2–9.1)

## 2014-07-30 LAB — VITAMIN B12: Vitamin B12: 803 pg/mL (ref 211–946)

## 2014-07-30 LAB — ALT: ALT: 34 U/L (ref 0–50)

## 2014-07-30 LAB — TIBC
Iron: 66 ug/dL (ref 45–170)
TIBC: 342 ug/dL (ref 250–450)
Transferrin Saturation: 19 % — ABNORMAL LOW (ref 20–55)

## 2014-07-30 LAB — FERRITIN: Ferritin: 181 ng/mL (ref 20–250)

## 2014-07-30 NOTE — Progress Notes (Signed)
Subjective: fatigue     Patient ID: Noah Craig is a 48 y.o. male.    HPI he is here for 2 recent hospital follow up    Fatigue; he is taking his medication, feel better, doing PT for knee surgery.    Anemia; he is taking Iron with some side effect of constipation. Feeling better, he is taking it BID    Constipation: he is taking senna and colace twice a day and take miralax as needed, he is moving bowel every 2-3 days. No N&V    Non smoker    Patient's medications, allergies were reviewed and updated as appropriate.    Review of Systems     Per HPI    Objective:   Physical Exam  Gen: NAD  Abdomen: soft, NT, BS+  Heart: S1S2 normal, no murmur  Lungs: clear, no wheezing  Ext; no edema, healing left knee replacement wound.      Plan:      Fatigue: improving, continue PT, continue to stay well hydrated, continue exercise, will monitor    Anemia; due to acute blood loss and iron deficiency, continue replacement with iron BID, check iron test today, continue for 3 months.     Constipation: likely due to medication, advise to take Miralax daily along with other medication, call back if continue to be problem, discussed to stay well hydrated,     Overall much better, reviewed both hospital course, reviewed all labs, spent greater than 25 min with patient and his wife, face to face. More than 50% time spent in counseling and education. Answered all question about anemia, threshold of blood transfusion etc.

## 2014-07-31 LAB — HEMOGLOBIN A1C: Hemoglobin A1C: 6.4 % — ABNORMAL HIGH (ref 4.0–6.0)

## 2014-08-01 ENCOUNTER — Other Ambulatory Visit: Payer: Self-pay | Admitting: Primary Care

## 2014-08-02 ENCOUNTER — Telehealth: Payer: Self-pay | Admitting: Primary Care

## 2014-08-02 NOTE — Telephone Encounter (Signed)
-----   Message from Talmage Nap, MD sent at 08/01/2014  6:04 PM EDT -----  Let him know that anemia improving, HCT is up to 38, diabetes well controlled, other labs are good, repeat before his next appointment and hopefully will be normal and take him off iron, continue for now. thanks

## 2014-08-02 NOTE — Telephone Encounter (Signed)
Advised pt:   -Let him know that anemia improving, HCT is up to 38, diabetes well controlled, other labs are good, repeat before his next appointment and hopefully will be normal and take him off iron, continue for now. thanks

## 2014-08-02 NOTE — Telephone Encounter (Signed)
Lmtcb/ td

## 2014-09-13 ENCOUNTER — Encounter: Payer: Self-pay | Admitting: Internal Medicine

## 2014-09-13 ENCOUNTER — Ambulatory Visit: Payer: Self-pay | Admitting: Internal Medicine

## 2014-09-13 VITALS — BP 128/90 | HR 80 | Temp 97.9°F | Ht 73.0 in | Wt 272.0 lb

## 2014-09-13 DIAGNOSIS — K120 Recurrent oral aphthae: Secondary | ICD-10-CM

## 2014-09-13 MED ORDER — PENICILLIN V POTASSIUM 500 MG PO TABS *I*
500.0000 mg | ORAL_TABLET | Freq: Three times a day (TID) | ORAL | Status: AC
Start: 2014-09-13 — End: 2014-09-20

## 2014-09-13 MED ORDER — PENICILLIN V POTASSIUM 500 MG PO TABS *I*
500.0000 mg | ORAL_TABLET | Freq: Three times a day (TID) | ORAL | Status: DC
Start: 2014-09-13 — End: 2014-09-13

## 2014-09-13 NOTE — Patient Instructions (Signed)
Start penicillin three times a day for one week with food.   Gargle warm salt water  If you have persistent discomfort or problems with this area, need to see your dentist.   Call us for worsening pain, swelling, fevers, or any other concerns.

## 2014-09-13 NOTE — Progress Notes (Signed)
Chief Complaint   Patient presents with    Mouth Lesions     c/o canker sore on upper gum. Recent knee replacement. Concerned about infections.       HPI:  Noah Craig comes in for had what he thought was a canker sore to his left upper gums for the past few days, seemed a bit swollen yesterday, had a bit of sinus pressure associated with it, applied some oragel yesterday and then it spontaneously drained relieving pressure and discomfort. he touched the area at that time and noted a small amount of blood.  No tooth pain or caries that he knows of, sees dentist regularly. His main reason for coming in is that he it traveling to go out of town for the week, leaving today and had a left TKA two months ago - he called his Psychologist, sport and exercise and discussed this and given the blood they recommended he be seen for consideration of oral abx. No fevers or chills. No recent URI. Feels well today.     MEDICAL PROBLEMS:  Patient Active Problem List   Diagnosis Code    Obesity 278.00    Hyperlipidemia 272.4    Diabetes mellitus 250.00    Elevated liver function tests 790.6    Hypertension 401.9    Vitamin D deficiency 268.9        CURRENT MEDICATIONS:  Prior to Admission medications    Medication Sig Start Date End Date Taking? Authorizing Provider   glipiZIDE (GLUCOTROL) 10 MG tablet TAKE 1 TABLET BY MOUTH TWO TIMES DAILY BEFORE MEALS 08/01/14  Yes Fredricka Bonine, Anuj, MD   metFORMIN (GLUCOPHAGE) 1000 MG tablet TAKE 1 TABLET BY MOUTH TWO TIMES DAILY WITH MEALS 06/28/14  Yes Bansal, Anuj, MD   B-D ULTRAFINE III SHORT PEN 31G X 8 MM USE ONCE DAILY AS DIRECTED 04/17/14  Yes Talmage Nap, MD   lancets (ONETOUCH ULTRASOFT) USE FOUR TIMES DAILY AS DIRECTED FOR DIABETES 03/27/14  Yes Talmage Nap, MD   ONE TOUCH ULTRA BLUE test strip TEST BLOOD SUGAR 2 TO 4 TIMES A DAY 03/27/14  Yes Talmage Nap, MD   cyclobenzaprine (FLEXERIL) 10 MG tablet Take 1 tablet (10 mg total) by mouth 2 times daily as needed for Muscle spasms 02/23/14  Yes Bansal, Anuj, MD    LANTUS SOLOSTAR 100 UNIT/ML injection pen INJECT 20 UNITS INTO THE SKIN NIGHTLY. INCREASE DOSE EVERY 3RD DAY AS DIRECTED BASED ON BLOOD SUGAR. MAXIMUM DAILY DOSE 30 UNITS A DAY.  Patient taking differently: INJECT 14 UNITS INTO THE SKIN NIGHTLY. 07/07/13  Yes Bansal, Anuj, MD   naproxen sodium (ANAPROX) 220 MG tablet Take 220 mg by mouth 2 times daily (with meals)   Pt takes as needed   Yes [provider]   blood glucose VI test strips (ONE TOUCH ULTRA TEST) USE ONE STRIP 4 TIMES DAILY DX: DM 250 08/23/11  Yes Talmage Nap, MD   penicillin v potassium (VEETIDS) 500 MG tablet Take 1 tablet (500 mg total) by mouth 3 times daily for 7 days 09/13/14 09/20/14  Collier Bullock, PA      Medications reviewed with patient, reconciled and no changes made today.    ALLERGIES:  No known drug allergy and No known latex allergy   Allergies reviewed with patient and confirmed today.    EXAMINATION:  Filed Vitals:    09/13/14 1220   BP: 128/90   Pulse: 80   Temp: 36.6 C (97.9 F)   TempSrc: Tympanic   Height: 1.854  m (6\' 1" )   Weight: 123.378 kg (272 lb)                                 Body mass index is 35.89 kg/(m^2).  BP Readings from Last 4 Encounters:   09/13/14 128/90   07/30/14 130/80   07/20/14 119/63   07/20/14 116/82      Wt Readings from Last 4 Encounters:   09/13/14 123.378 kg (272 lb)   07/30/14 121.383 kg (267 lb 9.6 oz)   07/20/14 120.203 kg (265 lb)   07/20/14 118.842 kg (262 lb)         Physical examination:  General: Well-appearing and well-nourished, nontoxic, no acute distress.  HEENT: Normocephalic, Atraumatic. Normal Conjunctiva.   Mucous membranes are moist.  Dentition intact without caries. There is what now appears to be an aphthous ulcer to the left upper gingiva, no swelling, scant erythema, no bleeding or drainage.  The uvula is mid line, the pharynx is without swelling erythema exudate.   Neck: Supple, trachea midline.  No cervical or supraclavicular lymphadenopathy.  Lungs: Breathing is  unlabored.  No respiratory distress, respiratory rate 16.    Extremities: Without swelling.  Neurological: Alert and oriented x3 no focal neurological deficits noted.      ASSESSMENT/PLAN:  Aphthous ulcer: although exam today appears consistent with a small aphthous ulcer, history sounds suspicious for a possible small abscess. Although the area appears quite well today, given recent knee replacement and the fact that he will be otu of town this week, I will start him on a course of penicillin to cover for oral infection, use and adverse effects reviewed. This doesn't seem like a typical dental abscess, but if he continues to have discomfort, advised he needs to be seen by his dentist for eval. Encouraged warm salt water rinses and reviewed symptoms that should prompt him to call. Patient verbalizes understanding and is agreeable.    Patient Instructions   Start penicillin three times a day for one week with food.   Gargle warm salt water  If you have persistent discomfort or problems with this area, need to see your dentist.   Call us for worsening pain, swelling, fevers, or any other concerns.

## 2014-09-21 ENCOUNTER — Other Ambulatory Visit: Payer: Self-pay | Admitting: Primary Care

## 2014-09-21 MED ORDER — INSULIN GLARGINE 100 UNIT/ML SC SOPN *I*
20.0000 [IU] | PEN_INJECTOR | Freq: Every evening | SUBCUTANEOUS | Status: DC
Start: 2014-09-21 — End: 2015-11-11

## 2014-09-21 MED ORDER — INSULIN PEN NEEDLE 31G X 8 MM MISC *I*
Freq: Every day | Status: DC
Start: 2014-09-21 — End: 2016-11-03

## 2014-09-24 ENCOUNTER — Ambulatory Visit: Payer: Self-pay | Admitting: Primary Care

## 2014-09-28 ENCOUNTER — Ambulatory Visit: Payer: Self-pay | Admitting: Primary Care

## 2014-10-01 ENCOUNTER — Ambulatory Visit: Payer: Self-pay | Admitting: Primary Care

## 2014-10-01 ENCOUNTER — Encounter: Payer: Self-pay | Admitting: Primary Care

## 2014-10-01 VITALS — BP 138/86 | HR 72 | Ht 73.0 in | Wt 276.0 lb

## 2014-10-01 DIAGNOSIS — E119 Type 2 diabetes mellitus without complications: Secondary | ICD-10-CM

## 2014-10-01 DIAGNOSIS — I10 Essential (primary) hypertension: Secondary | ICD-10-CM

## 2014-10-01 DIAGNOSIS — E785 Hyperlipidemia, unspecified: Secondary | ICD-10-CM

## 2014-10-01 NOTE — Progress Notes (Signed)
Subjective: DM     Patient ID: Cable Fearn is a 48 y.o. male    HPI:     DM: He is taking his medication daily. No tingling or numbness in feet. No change in vision. He is  taking Lantus and all his medication. He is not following diet and exercise lately, sugar are all over the place.     Hyperlipidemia: Not taking any medication. No muscle ache. No CP or SOB.    Hypertension: Not taking any medication, no CP or SOB.    Non smoker     Patient's medications, allergies were reviewed and updated as appropriate.    Review of Systems     Per HPI     Objective:   Physical Exam     Eye: No pallor or icterus  Heart: S1-S2 normal no murmur detected, no carotid bruit  Lung: Breath sounds are bilaterally equal  Abdomen: Soft nontender bowel sounds present  Extremities: No edema,  Neuro: Non focal     Plan:      DM: HBA1C At goal, continue current meds. encourage diet and exercise.     HTN: well controlled, continue diet and exercise.    HYPERLIPIDEMIA: LDL at goal. HDL below goal. TGD at goal. Advise diet and exercise.     HM: Colonoscopy up to date, vaccination up to date.     F/U in 2 months.

## 2014-11-08 ENCOUNTER — Other Ambulatory Visit: Payer: Self-pay | Admitting: Primary Care

## 2014-12-02 ENCOUNTER — Ambulatory Visit
Admit: 2014-12-02 | Discharge: 2014-12-02 | Disposition: A | Discharge status: Home / Self Care | Payer: Self-pay | Source: Ambulatory Visit | Attending: Primary Care | Admitting: Primary Care

## 2014-12-02 DIAGNOSIS — E119 Type 2 diabetes mellitus without complications: Secondary | ICD-10-CM

## 2014-12-02 LAB — CBC
Hematocrit: 44 % (ref 40–51)
Hemoglobin: 15.7 g/dL (ref 13.7–17.5)
MCH: 30 pg/cell (ref 26–32)
MCHC: 36 g/dL (ref 32–37)
MCV: 85 fL (ref 79–92)
Platelets: 174 10*3/uL (ref 150–330)
RBC: 5.2 MIL/uL (ref 4.6–6.1)
RDW: 13.3 % (ref 11.6–14.4)
WBC: 7.2 10*3/uL (ref 4.2–9.1)

## 2014-12-02 LAB — BASIC METABOLIC PANEL
Anion Gap: 11 (ref 7–16)
CO2: 26 mmol/L (ref 20–28)
Calcium: 9.4 mg/dL (ref 8.6–10.2)
Chloride: 101 mmol/L (ref 96–108)
Creatinine: 0.99 mg/dL (ref 0.67–1.17)
GFR,Black: 104 *
GFR,Caucasian: 90 *
Glucose: 219 mg/dL — ABNORMAL HIGH (ref 60–99)
Lab: 16 mg/dL (ref 6–20)
Potassium: 4.7 mmol/L (ref 3.3–5.1)
Sodium: 138 mmol/L (ref 133–145)

## 2014-12-02 LAB — HEMOGLOBIN A1C: Hemoglobin A1C: 8.7 % — ABNORMAL HIGH (ref 4.0–6.0)

## 2014-12-02 LAB — ALT: ALT: 68 U/L — ABNORMAL HIGH (ref 0–50)

## 2014-12-03 ENCOUNTER — Encounter: Payer: Self-pay | Admitting: Primary Care

## 2014-12-03 ENCOUNTER — Ambulatory Visit: Payer: Self-pay | Admitting: Primary Care

## 2014-12-03 VITALS — BP 120/80 | HR 82 | Ht 73.0 in | Wt 277.0 lb

## 2014-12-03 DIAGNOSIS — Z23 Encounter for immunization: Secondary | ICD-10-CM

## 2014-12-03 DIAGNOSIS — I10 Essential (primary) hypertension: Secondary | ICD-10-CM

## 2014-12-03 DIAGNOSIS — E785 Hyperlipidemia, unspecified: Secondary | ICD-10-CM

## 2014-12-03 DIAGNOSIS — E119 Type 2 diabetes mellitus without complications: Secondary | ICD-10-CM

## 2014-12-03 LAB — MICROALBUMIN, URINE, RANDOM
Creatinine,UR: 125 mg/dL (ref 20–300)
Microalb/Creat Ratio: 27.4 mg MA/g CR (ref 0.0–29.9)
Microalbumin,UR: 3.42 mg/dL

## 2014-12-03 NOTE — Progress Notes (Signed)
Subjective: DM     Patient ID: Noah Craig is a 48 y.o. male    HPI:     DM: He is taking his medication daily. No tingling or numbness in feet. No change in vision. He is taking Lantus and all his medication. He is not following diet and exercise lately, sugar are elevated.     Hyperlipidemia: Not taking any medication. No muscle ache. No CP or SOB.    Hypertension: Not taking any medication, no CP or SOB.    Back pain: on and off for a week, pain is dull, 6/10 and radiate up and down    Non smoker     Patient's medications, allergies were reviewed and updated as appropriate.    Review of Systems     Per HPI     Objective:   Physical Exam     Eye: No pallor or icterus  Heart: S1-S2 normal no murmur detected, no carotid bruit  Lung: Breath sounds are bilaterally equal, tenderness over right lower posterior rib cage  Abdomen: Soft nontender bowel sounds present  Extremities: No edema,  Neuro: Non focal     Plan:      DM: HBA1C Above goal, increase Lantus to 28 units. encourage diet and exercise. Recheck labs in 6 weeks.     HTN: Well controlled, continue diet and exercise.    HYPERLIPIDEMIA: LDL at goal. HDL below goal. TGD at goal. Advise diet and exercise.     Back pain: musculoskeletal, advise ice, pain control as needed,     HM: Colonoscopy up to date, vaccination up to date. Flu shot today    F/U in 3 months.

## 2014-12-10 ENCOUNTER — Other Ambulatory Visit: Payer: Self-pay | Admitting: Primary Care

## 2014-12-10 MED ORDER — GLIPIZIDE 10 MG PO TABS *I*
10.0000 mg | ORAL_TABLET | Freq: Every day | ORAL | Status: DC
Start: 2014-12-10 — End: 2015-04-17

## 2015-01-10 ENCOUNTER — Ambulatory Visit: Payer: Self-pay | Admitting: Primary Care

## 2015-01-10 ENCOUNTER — Encounter: Payer: Self-pay | Admitting: Primary Care

## 2015-01-10 VITALS — BP 150/90 | HR 90 | Temp 100.5°F | Ht 73.0 in | Wt 277.0 lb

## 2015-01-10 DIAGNOSIS — J069 Acute upper respiratory infection, unspecified: Secondary | ICD-10-CM

## 2015-01-10 MED ORDER — OSELTAMIVIR PHOSPHATE 75 MG PO CAPS *I*
75.0000 mg | ORAL_CAPSULE | Freq: Two times a day (BID) | ORAL | Status: DC
Start: 2015-01-10 — End: 2015-01-14

## 2015-01-10 NOTE — Progress Notes (Signed)
Subjective:       Noah Craig is a 49 y.o. male who presents for evaluation of achiness, facial pain, fever 101, nasal congestion, post nasal drip, productive cough with  yellow colored sputum, sinus pressure and sore throat. Onset of symptoms was 2 days ago, and has been gradually worsening since that time. Treatment to date: analgesics and cough suppressants.    Patient's medications, allergies were reviewed and updated as appropriate.    Review of Systems  Pertinent items are noted in HPI.     Objective:      General appearance: alert and cooperative  Head: sinuses tender to percussion  Ears: normal TM's and external ear canals both ears  Throat: lips, mucosa, and tongue normal; teeth and gums normal  Lungs: clear to auscultation bilaterally     Assessment:      Probably Influenza     Plan:      Discussed diagnosis and treatment of URI.  start Tamiflu given symptoms and duration, stay well hydrated, rest, discussed other supportive measure, took flu swab. call back tomorrow with update.

## 2015-01-11 ENCOUNTER — Telehealth: Payer: Self-pay | Admitting: Primary Care

## 2015-01-11 LAB — INFLUENZA  A & B/RSV PCR: Influenza A&B/RSV PCR: POSITIVE

## 2015-01-11 NOTE — Telephone Encounter (Signed)
-----   Message from Talmage Nap, MD sent at 01/11/2015  5:17 PM EST -----  Let him know that influenza came back negative but positive for RSV, it is also a Virus very similar to flu but Tamiflu will not work for it, stop Tamiflu, just supportive care, it last for 2-3 days and will get better, check to see how is he feeling, call back in 2-3 days with update. thanks

## 2015-01-11 NOTE — Telephone Encounter (Signed)
Left a detailed message, and call him again to check on him to see how he is feeling.

## 2015-01-12 NOTE — Telephone Encounter (Signed)
Continue with rest and stay well hydrated, sugar are high due to illness, stay with low carb diet, staying well hydrated will help sugar to stay low. Just watch for now, continue all medication, add extra 4 units to Lantus at night, schedule follow up with me on Friday afternoon and hold off on going to work until then. thanks

## 2015-01-12 NOTE — Telephone Encounter (Signed)
Spoke to patient, told him to continue with rest and stay well hydrated, sugars are high due to illness, stay with a low carb diet, staying well hydrated will help keep sugar readings down.  Just watch for now, continue all meds and add an extra 4 units of lantus at night.  Scheduled appointment on Friday to follow up and will remain out of work until then.

## 2015-01-12 NOTE — Telephone Encounter (Signed)
BG prior to lunch is 368

## 2015-01-12 NOTE — Telephone Encounter (Signed)
Spoke to patient, he feels congested today, feels his sugar might be out of sorts, has not been checking his sugars.  Feels his vision is a little blurry.  Sleeping a lot.  NO fever since yesterday afternoon.    He will check his BG before he eats lunch and he will call us.  He wants to know if he is contagious and when can he return to work.  A lot activity makes him cough still.

## 2015-01-14 ENCOUNTER — Encounter: Payer: Self-pay | Admitting: Primary Care

## 2015-01-14 ENCOUNTER — Ambulatory Visit: Payer: Self-pay | Admitting: Primary Care

## 2015-01-14 VITALS — BP 130/80 | HR 82 | Temp 99.2°F | Ht 73.0 in | Wt 277.0 lb

## 2015-01-14 DIAGNOSIS — J069 Acute upper respiratory infection, unspecified: Secondary | ICD-10-CM

## 2015-01-14 MED ORDER — DOXYCYCLINE MONOHYDRATE 100 MG PO CAPS *I*
100.0000 mg | ORAL_CAPSULE | Freq: Two times a day (BID) | ORAL | Status: DC
Start: 2015-01-14 — End: 2015-03-04

## 2015-01-14 NOTE — Progress Notes (Signed)
Subjective:       Noah Craig is a 49 y.o. male who presents for evaluation of cough described as productive of yellow and green sputum, facial pain, nasal congestion, post nasal drip and productive cough with  yellow and green colored sputum. Onset of symptoms was 8 days ago, and has been unchanged since that time. Treatment to date: analgesics, antihistamines, cough suppressants and decongestants. He is recently diagnosed with URI due to RSV.    Patient's medications, allergies were reviewed and updated as appropriate.    Review of Systems  Pertinent items are noted in HPI.     Objective:      General appearance: alert and cooperative  Head: sinuses tender to percussion  Throat: + post nasal drip  Lungs: clear to auscultation bilaterally     Assessment:      sinusitis     Plan:      Discussed diagnosis and treatment of URI.  recently diagnosed with URI and RSV now with sinus symptoms, i think he has bacterial sinus infection, will start Doxycycline, continue supportive measure, advise to call back if no improvement in 3 days. Pt reassured.

## 2015-03-02 ENCOUNTER — Encounter: Payer: Self-pay | Admitting: Gastroenterology

## 2015-03-04 ENCOUNTER — Encounter: Payer: Self-pay | Admitting: Primary Care

## 2015-03-04 ENCOUNTER — Ambulatory Visit: Payer: Self-pay | Admitting: Primary Care

## 2015-03-04 VITALS — BP 138/88 | HR 73 | Ht 73.0 in | Wt 284.0 lb

## 2015-03-04 DIAGNOSIS — E119 Type 2 diabetes mellitus without complications: Secondary | ICD-10-CM

## 2015-03-04 DIAGNOSIS — I10 Essential (primary) hypertension: Secondary | ICD-10-CM

## 2015-03-04 DIAGNOSIS — E785 Hyperlipidemia, unspecified: Secondary | ICD-10-CM

## 2015-03-04 LAB — BASIC METABOLIC PANEL
Anion Gap: 10 (ref 7–16)
CO2: 27 mmol/L (ref 20–28)
Calcium: 8.6 mg/dL (ref 8.6–10.2)
Chloride: 100 mmol/L (ref 96–108)
Creatinine: 0.91 mg/dL (ref 0.67–1.17)
GFR,Black: 115 *
GFR,Caucasian: 100 *
Glucose: 336 mg/dL — ABNORMAL HIGH (ref 60–99)
Lab: 14 mg/dL (ref 6–20)
Potassium: 4.2 mmol/L (ref 3.3–5.1)
Sodium: 137 mmol/L (ref 133–145)

## 2015-03-04 LAB — ALT: ALT: 42 U/L (ref 0–50)

## 2015-03-04 LAB — CBC
Hematocrit: 41 % (ref 40–51)
Hemoglobin: 14.5 g/dL (ref 13.7–17.5)
MCH: 31 pg/cell (ref 26–32)
MCHC: 35 g/dL (ref 32–37)
MCV: 87 fL (ref 79–92)
Platelets: 147 10*3/uL — ABNORMAL LOW (ref 150–330)
RBC: 4.7 MIL/uL (ref 4.6–6.1)
RDW: 12.7 % (ref 11.6–14.4)
WBC: 5.8 10*3/uL (ref 4.2–9.1)

## 2015-03-04 LAB — HM DIABETES FOOT EXAM

## 2015-03-04 NOTE — Progress Notes (Addendum)
Subjective: DM     Patient ID: Linley Moxley is a 49 y.o. male    HPI:     DM: He is taking his medication daily. No tingling or numbness in feet. No change in vision. He is taking Lantus 30 units daily. He is following diet and exercise lately though weight is up little this time.    Hyperlipidemia: Not taking any medication. No muscle ache. No CP or SOB.    Hypertension: Not taking any medication, no CP or SOB.    Non smoker     Patient's medications, allergies were reviewed and updated as appropriate.    Review of Systems     Per HPI     Objective:   Physical Exam     Eye: No pallor or icterus  Heart: S1-S2 normal no murmur detected, no carotid bruit  Lung: Breath sounds are bilaterally equal  Abdomen: Soft nontender bowel sounds present  Extremities: No edema  Neuro: Non focal  Feet: normal sensation both feet     Plan:      DM: HBA1C Above goal, encourage diet and exercise. Recheck labs today, continue Lantus 30 units daily with other medications.     HTN: Well controlled, continue diet and exercise. Encourage weight loss.     HYPERLIPIDEMIA: LDL at goal. HDL below goal. TGD at goal. Advise diet and exercise.      HM: Colonoscopy up to date, vaccination up to date.     F/U in 3 months.

## 2015-03-05 LAB — HEMOGLOBIN A1C: Hemoglobin A1C: 7.9 % — ABNORMAL HIGH (ref 4.0–6.0)

## 2015-03-06 ENCOUNTER — Other Ambulatory Visit: Payer: Self-pay | Admitting: Primary Care

## 2015-03-06 DIAGNOSIS — E785 Hyperlipidemia, unspecified: Secondary | ICD-10-CM

## 2015-03-06 DIAGNOSIS — E119 Type 2 diabetes mellitus without complications: Secondary | ICD-10-CM

## 2015-03-07 ENCOUNTER — Telehealth: Payer: Self-pay | Admitting: Primary Care

## 2015-03-07 NOTE — Telephone Encounter (Signed)
-----   Message from Talmage Nap, MD sent at 03/06/2015 11:06 PM EST -----  Sugar little better, HBA1C down to 7.9 from 8.7, goal is below 7, continue current medication, diet and exercise as discussed, labs ordered for next visit. thanks

## 2015-03-07 NOTE — Telephone Encounter (Signed)
Spoke to Noah Craig, told him sugar is a little better, HBA1C is down to 7.9 from 8.7.  Goal is to be below 7.  Continue current meds and diet and exercise as discussed, he will do labs for the next visit.

## 2015-03-17 ENCOUNTER — Other Ambulatory Visit: Payer: Self-pay | Admitting: Primary Care

## 2015-04-17 ENCOUNTER — Other Ambulatory Visit: Payer: Self-pay | Admitting: Primary Care

## 2015-06-10 ENCOUNTER — Encounter: Payer: Self-pay | Admitting: Primary Care

## 2015-06-10 ENCOUNTER — Ambulatory Visit: Payer: Self-pay | Admitting: Primary Care

## 2015-06-10 VITALS — BP 136/80 | HR 77 | Ht 73.0 in | Wt 277.0 lb

## 2015-06-10 DIAGNOSIS — E785 Hyperlipidemia, unspecified: Secondary | ICD-10-CM

## 2015-06-10 DIAGNOSIS — I10 Essential (primary) hypertension: Secondary | ICD-10-CM

## 2015-06-10 DIAGNOSIS — E119 Type 2 diabetes mellitus without complications: Secondary | ICD-10-CM

## 2015-06-10 NOTE — Progress Notes (Signed)
Subjective: DM     Patient ID: Jiovanny Burdell is a 49 y.o. male    HPI:     DM: He is taking his medication daily. No tingling or numbness in feet. No change in vision. He is taking Lantus 30 units daily. He is following diet and exercise.    Hyperlipidemia: Not taking any medication. No muscle ache. No CP or SOB.    Hypertension: Not taking any medication, no CP or SOB.    Non smoker     Patient's medications, allergies were reviewed and updated as appropriate.    Review of Systems     Per HPI     Objective:   Physical Exam     Eye: No pallor or icterus  Heart: S1-S2 normal no murmur detected, no carotid bruit  Lung: Breath sounds are bilaterally equal  Abdomen: Soft nontender bowel sounds present  Extremities: No edema  Neuro: Non focal    Plan:      DM: HBA1C Above goal, encourage diet and exercise. Recheck labs today, continue Lantus 30 units daily with other medications.     HTN: Well controlled, continue diet and exercise. Encourage weight loss.     HYPERLIPIDEMIA: LDL at goal. HDL below goal. TGD at goal. Advise diet and exercise.  Check labs.     HM: Colonoscopy up to date, vaccination up to date.     F/U in 3 months.

## 2015-06-13 ENCOUNTER — Other Ambulatory Visit: Payer: Self-pay | Admitting: Primary Care

## 2015-06-13 ENCOUNTER — Other Ambulatory Visit: Payer: Self-pay

## 2015-06-13 DIAGNOSIS — E119 Type 2 diabetes mellitus without complications: Secondary | ICD-10-CM

## 2015-06-13 DIAGNOSIS — E785 Hyperlipidemia, unspecified: Secondary | ICD-10-CM

## 2015-06-13 LAB — BASIC METABOLIC PANEL
Anion Gap: 5 (ref 3–11)
CO2: 29 meq/L^meq/L (ref 21–32)
Calcium: 8.6 mg/dL^mg/dL (ref 8.5–10.1)
Chloride: 105 meq/L^meq/L (ref 98–107)
Creatinine: 1.1 mg/dL^mg/dL (ref 0.6–1.3)
GFR,Black: 86 mL/min/{1.73_m2}
GFR,Caucasian: 71 mL/min/{1.73_m2}
Glucose: 126 mg/dL^mg/dL — ABNORMAL HIGH (ref 70–100)
Lab: 15 mg/dL^mg/dL (ref 7–18)
Potassium: 3.9 meq/L^meq/L (ref 3.5–5.1)
Sodium: 139 meq/L^meq/L (ref 136–145)

## 2015-06-13 LAB — CBC
Hematocrit: 45 %^% (ref 40.1–51.0)
Hemoglobin: 15.8 g/dL^g/dL (ref 13.7–17.5)
MCH: 29.9 pg^pg (ref 25.7–32.2)
MCHC: 35.1 g/dL^g/dL (ref 32.3–36.5)
MCV: 85.1 fL^fL (ref 79.0–92.2)
Nucl RBC %: 0 /100 WBC^/100 WBC (ref 0.0–0.2)
Platelets: 176 10*3/uL (ref 163–337)
RBC Distribution Width-SD: 39.2 fL^fL (ref 35.1–43.9)
RBC: 5.29 10*6/uL (ref 4.63–6.08)
RDW: 12.8 %^% (ref 11.6–14.4)
WBC: 7.1 10*3/uL (ref 4.2–9.1)

## 2015-06-13 LAB — LIPID PANEL
Chol/HDL Ratio: 2.1 (ref 1.0–3.5)
Cholesterol: 80 mg/dL^mg/dL (ref 0–199)
HDL: 39 mg/dL^mg/dL — ABNORMAL LOW (ref 40–50)
LDL Calculated: 31 mg/dL^mg/dL (ref 0–129)
Triglycerides: 49 mg/dL^mg/dL (ref 0–149)

## 2015-06-13 LAB — ALT: ALT: 61 U/L^U/L (ref 12–78)

## 2015-06-13 LAB — HEMOGLOBIN A1C: Hemoglobin A1C: 7.4 %^% — ABNORMAL HIGH (ref 4.8–5.6)

## 2015-06-15 ENCOUNTER — Telehealth: Payer: Self-pay | Admitting: Primary Care

## 2015-06-15 ENCOUNTER — Other Ambulatory Visit: Payer: Self-pay | Admitting: Primary Care

## 2015-06-15 DIAGNOSIS — E119 Type 2 diabetes mellitus without complications: Secondary | ICD-10-CM

## 2015-06-15 NOTE — Telephone Encounter (Signed)
I left a message to call the office back.

## 2015-06-15 NOTE — Telephone Encounter (Signed)
-----   Message from Talmage Nap, MBBS sent at 06/15/2015  6:13 AM EDT -----  Let him know that labs look good, HBA1C is coming down, it is 7.4, down from 7.9, need to be below 7, continue current medication, continue exercise and diet, repeat labs in 3 months, ordered, try to do before appointment, no need to fast for next lab work, ordered a urine sample for micro albumin with next lab draw. thanks

## 2015-06-16 NOTE — Telephone Encounter (Signed)
Pt given message below

## 2015-06-16 NOTE — Telephone Encounter (Signed)
I left a 2 nd message to call the office.

## 2015-07-27 ENCOUNTER — Other Ambulatory Visit: Payer: Self-pay | Admitting: Primary Care

## 2015-08-29 ENCOUNTER — Other Ambulatory Visit: Payer: Self-pay | Admitting: Primary Care

## 2015-09-07 ENCOUNTER — Other Ambulatory Visit: Payer: Self-pay

## 2015-09-07 ENCOUNTER — Other Ambulatory Visit: Payer: Self-pay | Admitting: Primary Care

## 2015-09-07 DIAGNOSIS — E119 Type 2 diabetes mellitus without complications: Secondary | ICD-10-CM

## 2015-09-07 LAB — CBC
Hematocrit: 44.1 %^% (ref 40.1–51.0)
Hemoglobin: 15.6 g/dL^g/dL (ref 13.7–17.5)
MCH: 30.2 pg^pg (ref 25.7–32.2)
MCHC: 35.4 g/dL^g/dL (ref 32.3–36.5)
MCV: 85.3 fL^fL (ref 79.0–92.2)
Nucl RBC %: 0 /100 WBC^/100 WBC (ref 0.0–0.2)
Platelets: 189 10*3/uL (ref 163–337)
RBC Distribution Width-SD: 39.6 fL^fL (ref 35.1–43.9)
RBC: 5.17 10*6/uL (ref 4.63–6.08)
RDW: 13 %^% (ref 11.6–14.4)
WBC: 9.1 10*3/uL (ref 4.2–9.1)

## 2015-09-07 LAB — BASIC METABOLIC PANEL
Anion Gap: 5 (ref 3–11)
CO2: 30 meq/L^meq/L (ref 21–32)
Calcium: 9.2 mg/dL^mg/dL (ref 8.5–10.1)
Chloride: 102 meq/L^meq/L (ref 98–107)
Creatinine: 1.1 mg/dL^mg/dL (ref 0.6–1.3)
GFR,Black: 86 mL/min/{1.73_m2}
GFR,Caucasian: 71 mL/min/{1.73_m2}
Glucose: 128 mg/dL^mg/dL — ABNORMAL HIGH (ref 70–100)
Lab: 15 mg/dL^mg/dL (ref 7–18)
Potassium: 3.9 meq/L^meq/L (ref 3.5–5.1)
Sodium: 137 meq/L^meq/L (ref 136–145)

## 2015-09-07 LAB — HEMOGLOBIN A1C: Hemoglobin A1C: 7.1 %^% — ABNORMAL HIGH (ref 4.8–5.6)

## 2015-09-07 LAB — ALT: ALT: 65 U/L^U/L (ref 12–78)

## 2015-09-07 LAB — MICROALBUMIN / CREATININE URINE RATIO: Microalb Creat Ratio: 19.2 mg/gm cr^mg/gm cr (ref 0.0–30.0)

## 2015-09-09 ENCOUNTER — Encounter: Payer: Self-pay | Admitting: Primary Care

## 2015-09-09 ENCOUNTER — Ambulatory Visit: Payer: Self-pay | Admitting: Primary Care

## 2015-09-09 VITALS — BP 138/80 | HR 78 | Ht 73.0 in | Wt 275.0 lb

## 2015-09-09 DIAGNOSIS — I10 Essential (primary) hypertension: Secondary | ICD-10-CM

## 2015-09-09 DIAGNOSIS — E119 Type 2 diabetes mellitus without complications: Secondary | ICD-10-CM

## 2015-09-09 DIAGNOSIS — E782 Mixed hyperlipidemia: Secondary | ICD-10-CM

## 2015-09-09 MED ORDER — METFORMIN HCL 500 MG PO TB24 *I*
1000.0000 mg | ORAL_TABLET | Freq: Two times a day (BID) | ORAL | 3 refills | Status: DC
Start: 2015-09-09 — End: 2016-01-23

## 2015-09-09 NOTE — Progress Notes (Signed)
Subjective: DM     Patient ID: Noah Craig is a 49 y.o. male    HPI:     DM: He is taking his medication daily. No tingling or numbness in feet. No change in vision. He is taking Lantus 30 units daily. He is following diet and exercise. He is having some diarrhea with Metformin.     Hyperlipidemia: Not taking any medication. No muscle ache. No CP or SOB.    Hypertension: Not taking any medication, no CP or SOB.    Left heel pain: for few week, wearing supports    Non smoker     Patient's medications, allergies were reviewed and updated as appropriate.    Review of Systems     Per HPI     Objective:   Physical Exam     Eye: No pallor or icterus  Heart: S1-S2 normal no murmur detected, no carotid bruit  Lung: Breath sounds are bilaterally equal  Abdomen: Soft nontender bowel sounds present  Extremities: No edema  Neuro: Non focal  Feet: tenderness over left heel    Plan:      DM: HBA1C Above goal but improving, encourage diet and exercise.  continue Lantus 30 units daily with other medications, will change Metformin to XR. Repeat ni 4 months, advised eye exam.     HTN: Well controlled, continue diet and exercise. Encourage weight loss.     HYPERLIPIDEMIA: LDL at goal. HDL below goal. TGD at goal. Advise diet and exercise.     Left heel pain: due to plantar fascitis, advised stretching, call back if no improvement.     HM: Colonoscopy up to date, vaccination up to date.     F/U in 4 months.

## 2015-10-22 ENCOUNTER — Other Ambulatory Visit: Payer: Self-pay | Admitting: Primary Care

## 2015-10-28 ENCOUNTER — Other Ambulatory Visit: Payer: Self-pay | Admitting: Emergency Medicine

## 2015-10-28 ENCOUNTER — Telehealth: Payer: Self-pay | Admitting: Primary Care

## 2015-10-28 LAB — COMPREHENSIVE METABOLIC PANEL
ALT: 39 U/L^U/L (ref 12–78)
AST: 20 U/L^U/L (ref 11–37)
Albumin: 3.5 g/dL^g/dL (ref 3.4–5.0)
Alk Phos: 97 U/L^U/L (ref 45–117)
Anion Gap: 9 meq/L^meq/L (ref 3–11)
Bilirubin,Total: 0.7 mg/dL^mg/dL (ref 0.2–1.0)
CO2: 26 meq/L^meq/L (ref 21–32)
Calcium: 8.7 mg/dL^mg/dL (ref 8.5–10.1)
Chloride: 106 meq/L^meq/L (ref 98–107)
Creatinine: 0.9 mg/dL^mg/dL (ref 0.6–1.3)
GFR,Black: 109 mL/min/{1.73_m2}
GFR,Caucasian: 90 mL/min/{1.73_m2}
Glucose: 188 mg/dL^mg/dL — ABNORMAL HIGH (ref 70–100)
Lab: 15 mg/dL^mg/dL (ref 7–18)
Potassium: 3.7 meq/L^meq/L (ref 3.5–5.1)
Sodium: 141 meq/L^meq/L (ref 136–145)
Total Protein: 7.1 g/dL^g/dL (ref 6.4–8.2)

## 2015-10-28 LAB — CBC AND DIFFERENTIAL
Baso # K/uL: 0 10*3/uL (ref 0.0–0.1)
Basophil %: 0.2 %^% (ref 0.1–1.2)
Eos # K/uL: 0.2 10*3/uL (ref 0.0–0.5)
Eosinophil %: 2.2 %^% (ref 0.8–7.0)
Hematocrit: 39.4 %^% — ABNORMAL LOW (ref 40.1–51.0)
Hemoglobin: 14.7 g/dL^g/dL (ref 13.7–17.5)
Immature Granulocytes Absolute: 0.02 10*3/uL (ref 0.0–0.2)
Immature Granulocytes: 0.2 %^% (ref 0.0–2.0)
Lymph # K/uL: 2.9 10*3/uL (ref 1.3–3.6)
Lymphocyte %: 35.8 %^% (ref 21.8–53.1)
MCH: 30.9 pg^pg (ref 25.7–32.2)
MCHC: 37.3 g/dL^g/dL — ABNORMAL HIGH (ref 32.3–36.5)
MCV: 82.9 fL^fL (ref 79.0–92.2)
Mono # K/uL: 0.6 10*3/uL (ref 0.3–0.8)
Monocyte %: 7 %^% (ref 5.3–12.2)
Neut # K/uL: 4.4 10*3/uL (ref 1.8–5.4)
Nucl RBC %: 0 /100 WBC^/100 WBC (ref 0.0–0.2)
Platelets: 143 10*3/uL — ABNORMAL LOW (ref 163–337)
RBC Distribution Width-SD: 37.2 fL^fL (ref 35.1–43.9)
RBC: 4.75 10*6/uL (ref 4.63–6.08)
RDW: 12.4 %^% (ref 11.6–14.4)
Seg Neut %: 54.6 %^% (ref 34.0–67.9)
WBC: 8.1 10*3/uL (ref 4.2–9.1)

## 2015-10-28 LAB — LIPASE: Lipase: 145 U/L^U/L (ref 73–393)

## 2015-10-28 NOTE — Telephone Encounter (Signed)
Pt called 21:00 w worsening left flank pain.     cscope 2013 showed diverticulosis.     Given hour, recommended ED for eval kidney stone v pyelo v diverticulitis.     He verbalized agreement

## 2015-10-29 ENCOUNTER — Other Ambulatory Visit: Payer: Self-pay | Admitting: Gastroenterology

## 2015-10-29 LAB — URINALYSIS WITH REFLEX TO MICROSCOPIC
Bilirubin,Ur: NEGATIVE
Blood,UA: NEGATIVE
Epithelial Cells: 0 /HPF^/HPF
Glucose,UA: >=1000 mg/dL^mg/dL — AB
Ketones, UA: NEGATIVE mg/dL^mg/dL
Leuk Esterase,UA: NEGATIVE
Nitrite,UA: NEGATIVE
Protein,UA: NEGATIVE mg/dL^mg/dL
RBC,UA: 0 /HPF^/HPF (ref 0–5)
Specific Gravity,UA: 1.031 — ABNORMAL HIGH (ref 1.005–1.030)
Urobilinogen,UA: 1 mg/dL^mg/dL
WBC,UA: 0 /HPF^/HPF (ref 0–5)
pH,UA: 5.5 (ref 5.0–8.0)

## 2015-11-01 ENCOUNTER — Encounter: Payer: Self-pay | Admitting: Gastroenterology

## 2015-11-01 LAB — HM DIABETES EYE EXAM

## 2015-11-02 ENCOUNTER — Encounter: Payer: Self-pay | Admitting: Primary Care

## 2015-11-02 DIAGNOSIS — E11319 Type 2 diabetes mellitus with unspecified diabetic retinopathy without macular edema: Secondary | ICD-10-CM | POA: Insufficient documentation

## 2015-11-11 ENCOUNTER — Other Ambulatory Visit: Payer: Self-pay | Admitting: Primary Care

## 2016-01-09 ENCOUNTER — Encounter: Payer: Self-pay | Admitting: Primary Care

## 2016-01-09 ENCOUNTER — Ambulatory Visit: Payer: Self-pay | Admitting: Primary Care

## 2016-01-09 VITALS — BP 140/80 | HR 88 | Temp 98.3°F | Wt 276.0 lb

## 2016-01-09 DIAGNOSIS — J40 Bronchitis, not specified as acute or chronic: Secondary | ICD-10-CM

## 2016-01-09 DIAGNOSIS — R5382 Chronic fatigue, unspecified: Secondary | ICD-10-CM

## 2016-01-09 NOTE — Progress Notes (Signed)
Subjective:       Noah Craig is a 50 y.o. male who presents for evaluation of cough described as productive of yellow sputum, facial pain, nasal congestion and post nasal drip. Onset of symptoms was 3 days ago, and has been unchanged since that time. Treatment to date: analgesics, antihistamines, cough suppressants and decongestants.     Patient's medications, allergies were reviewed and updated as appropriate.    Review of Systems  Pertinent items are noted in HPI.     Objective:      General appearance: alert and cooperative  Head: sinuses tender to percussion  Throat: + post nasal drip  Lungs: clear to auscultation bilaterally     Assessment:      bronchitis     Plan:      Discussed diagnosis and treatment of URI.  discussed likely Viral, antibiotic are not indicated, advised rest and stay well hydrated, call back if no improvement in 3 days, work excuse given for 2 days.

## 2016-01-10 ENCOUNTER — Telehealth: Payer: Self-pay | Admitting: Primary Care

## 2016-01-10 NOTE — Telephone Encounter (Signed)
Told Joelle:  Advise tylenol for headache, may take cough suppressant like Mucinex DM which is in pill form, give it another day or two, OK to return to work tomorrow if feel better otherwise call us and will extend work excuse. thanks

## 2016-01-10 NOTE — Telephone Encounter (Signed)
Advise tylenol for headache, may take cough suppressant like Mucinex DM which is in pill form, give it another day or two, OK to return to work tomorrow if feel better otherwise call us and will extend work excuse. thanks

## 2016-01-10 NOTE — Telephone Encounter (Signed)
Pt states " Dr Fredricka Bonine told me yesterday that I would start feeling better today, but I am not, still having HA and cough, he feels about the same no worse but no better". Pt wondering what to do? Please advise

## 2016-01-11 ENCOUNTER — Other Ambulatory Visit: Payer: Self-pay

## 2016-01-11 ENCOUNTER — Other Ambulatory Visit: Payer: Self-pay | Admitting: Primary Care

## 2016-01-11 DIAGNOSIS — E119 Type 2 diabetes mellitus without complications: Secondary | ICD-10-CM

## 2016-01-11 DIAGNOSIS — R5382 Chronic fatigue, unspecified: Secondary | ICD-10-CM

## 2016-01-11 LAB — BASIC METABOLIC PANEL
Anion Gap: 11 (ref 7–16)
CO2: 27 mmol/L^mmol/L (ref 20–28)
Calcium: 9.7 mg/dL^mg/dL (ref 8.6–10.2)
Chloride: 96 mmol/L^mmol/L (ref 96–108)
Creatinine: 0.97 mg/dL^mg/dL (ref 0.67–1.10)
GFR,Black: 100 mL/min/{1.73_m2}
GFR,Caucasian: 83 mL/min/{1.73_m2}
Glucose: 386 mg/dL^mg/dL — ABNORMAL HIGH (ref 60–99)
Lab: 13 mg/dL^mg/dL (ref 6–20)
Potassium: 4.4 mmol/L^mmol/L (ref 3.4–4.7)
Sodium: 134 mmol/L^mmol/L (ref 133–145)

## 2016-01-11 LAB — CBC
Hematocrit: 45.5 %^% (ref 40.1–51.0)
Hemoglobin: 16 g/dL^g/dL (ref 13.7–17.5)
MCH: 29.9 pg^pg (ref 25.7–32.2)
MCHC: 35.2 g/dL^g/dL (ref 32.3–36.5)
MCV: 84.9 fL^fL (ref 79.0–92.2)
Nucl RBC # K/uL: 0 /100 WBC^/100 WBC (ref 0.0–0.2)
Platelets: 196 10*3/uL (ref 163–337)
RBC Distribution Width-SD: 37.8 fL^fL (ref 35.1–43.9)
RBC: 5.36 10*6/uL (ref 4.63–6.08)
RDW: 12.5 %^% (ref 11.6–14.4)
WBC: 7.8 10*3/uL (ref 4.2–9.1)

## 2016-01-11 LAB — TSH: TSH: 2.76 uIU/mL^uIU/mL (ref 0.27–4.20)

## 2016-01-11 LAB — HEMOGLOBIN A1C: Hemoglobin A1C: 8 %^% — ABNORMAL HIGH (ref 4.0–6.0)

## 2016-01-11 LAB — T4, FREE: Free T4: 1.16 ng/dL^ng/dL (ref 0.90–1.70)

## 2016-01-11 LAB — ALT: ALT: 38 U/L^U/L (ref 0–50)

## 2016-01-11 LAB — VITAMIN B12: Vitamin B12: 800 pg/mL^pg/mL (ref 211–946)

## 2016-01-12 LAB — TESTOSTERONE: Testosterone: 254 ng/dL^ng/dL (ref 249–836)

## 2016-01-13 ENCOUNTER — Ambulatory Visit: Payer: Self-pay | Admitting: Primary Care

## 2016-01-16 ENCOUNTER — Encounter: Payer: Self-pay | Admitting: Primary Care

## 2016-01-16 ENCOUNTER — Ambulatory Visit: Payer: Self-pay | Admitting: Primary Care

## 2016-01-16 VITALS — BP 130/80 | Ht 73.0 in | Wt 276.0 lb

## 2016-01-16 DIAGNOSIS — I10 Essential (primary) hypertension: Secondary | ICD-10-CM | POA: Insufficient documentation

## 2016-01-16 DIAGNOSIS — E119 Type 2 diabetes mellitus without complications: Secondary | ICD-10-CM

## 2016-01-16 DIAGNOSIS — E782 Mixed hyperlipidemia: Secondary | ICD-10-CM | POA: Insufficient documentation

## 2016-01-16 LAB — VITAMIN D
25-OH VIT D2: <4 ng/mL^ng/mL
25-OH VIT D3: 29 ng/mL^ng/mL
25-OH Vit Total: 29 ng/mL^ng/mL — ABNORMAL LOW (ref 30–60)

## 2016-01-16 NOTE — Progress Notes (Signed)
Subjective: DM     Patient ID: Leanard Ishee is a 50 y.o. male    HPI:     DM: He is taking his medication daily. No tingling or numbness in feet. No change in vision. He is taking Lantus 30 units daily. He is following diet but no exercise.     Hyperlipidemia: Not taking any medication. No muscle ache. No CP or SOB.    Hypertension: Not taking any medication, no CP or SOB.    Non smoker     Patient's medications, allergies were reviewed and updated as appropriate.    Review of Systems     Per HPI     Objective:   Physical Exam     Eye: No pallor or icterus  Heart: S1-S2 normal no murmur detected, no carotid bruit  Lung: Breath sounds are bilaterally equal  Abdomen: Soft nontender bowel sounds present  Extremities: No edema  Neuro: Non focal    Plan:      DM: HBA1C Above goal, encourage diet and exercise.  continue Lantus 30 units daily with other medications, repeat in 3 months.     HTN: Well controlled, continue diet and exercise. Encourage weight loss.     HYPERLIPIDEMIA: LDL at goal. HDL below goal. TGD at goal. Advise diet and exercise.     Low T: discussed exercises, will monitor.     HM: Colonoscopy up to date, vaccination up to date.     F/U in 3 months.

## 2016-01-23 ENCOUNTER — Other Ambulatory Visit: Payer: Self-pay | Admitting: Primary Care

## 2016-03-01 ENCOUNTER — Ambulatory Visit: Payer: Self-pay | Admitting: Primary Care

## 2016-03-01 ENCOUNTER — Encounter: Payer: Self-pay | Admitting: Primary Care

## 2016-03-01 VITALS — BP 130/80 | HR 68 | Ht 73.0 in | Wt 282.0 lb

## 2016-03-01 DIAGNOSIS — H5711 Ocular pain, right eye: Secondary | ICD-10-CM

## 2016-03-01 MED ORDER — CLINDAMYCIN HCL 300 MG PO CAPS *I*
300.0000 mg | ORAL_CAPSULE | Freq: Three times a day (TID) | ORAL | 0 refills | Status: DC
Start: 2016-03-01 — End: 2016-04-27

## 2016-03-01 NOTE — Progress Notes (Signed)
Subjective: right eye pain     Patient ID: Noah Craig is a 50 y.o. male.    HPI     Right eye pain: since yesterday, + swelling. Pain is dull, 5/10 and radiate around the eye. No fever. No injury. He denies any similar complaint in past. He did not take any medication.    Non smoker    Patient's medications, allergies were reviewed and updated as appropriate.    Review of Systems     Per HPI    Objective:   Physical Exam  Gen: NAD  Right eye: red and swollen right upper eye lid, tender to touch, some redness in peri orbital region    Plan:      Right eye pain: due to periorbital cellulitis. Discussed with patient and wife, advise to start Clindamycin, NSAID for pain control and warm compresses TID, call back if no improvement in 3-4 days or sooner if any worsening.

## 2016-04-20 ENCOUNTER — Ambulatory Visit: Payer: Self-pay | Admitting: Primary Care

## 2016-04-25 ENCOUNTER — Other Ambulatory Visit: Payer: Self-pay | Admitting: Primary Care

## 2016-04-25 ENCOUNTER — Other Ambulatory Visit: Payer: Self-pay

## 2016-04-25 DIAGNOSIS — E119 Type 2 diabetes mellitus without complications: Secondary | ICD-10-CM

## 2016-04-25 DIAGNOSIS — E782 Mixed hyperlipidemia: Secondary | ICD-10-CM

## 2016-04-25 LAB — CBC
Hematocrit: 44 %^% (ref 40.1–51.0)
Hemoglobin: 15.7 g/dL^g/dL (ref 13.7–17.5)
MCH: 30 pg^pg (ref 25.7–32.2)
MCHC: 35.7 g/dL^g/dL (ref 32.3–36.5)
MCV: 84.1 fL^fL (ref 79.0–92.2)
Nucl RBC # K/uL: 0 /100 WBC^/100 WBC (ref 0.0–0.2)
Platelets: 193 10*3/uL (ref 163–337)
RBC Distribution Width-SD: 39.2 fL^fL (ref 35.1–43.9)
RBC: 5.23 10*6/uL (ref 4.63–6.08)
RDW: 12.8 %^% (ref 11.6–14.4)
WBC: 6.6 10*3/uL (ref 4.2–9.1)

## 2016-04-25 LAB — BASIC METABOLIC PANEL
Anion Gap: 9 (ref 7–16)
CO2: 28 mmol/L^mmol/L (ref 20–28)
Calcium: 9.4 mg/dL^mg/dL (ref 8.6–10.2)
Chloride: 104 mmol/L^mmol/L (ref 96–108)
Creatinine: 0.91 mg/dL^mg/dL (ref 0.67–1.10)
GFR,Black: 108 mL/min/{1.73_m2}
GFR,Caucasian: 89 mL/min/{1.73_m2}
Glucose: 187 mg/dL^mg/dL — ABNORMAL HIGH (ref 60–99)
Lab: 15 mg/dL^mg/dL (ref 6–20)
Potassium: 4.4 mmol/L^mmol/L (ref 3.4–4.7)
Sodium: 141 mmol/L^mmol/L (ref 133–145)

## 2016-04-25 LAB — ALT: ALT: 36 U/L^U/L (ref 0–50)

## 2016-04-25 LAB — LIPID PANEL
Chol/HDL Ratio: 2.7 (ref 1.0–3.5)
Cholesterol: 90 mg/dL^mg/dL (ref 0–199)
HDL: 33 mg/dL^mg/dL — ABNORMAL LOW (ref 40–60)
LDL Calculated: 47 mg/dL^mg/dL (ref 0–129)
Triglycerides: 49 mg/dL^mg/dL (ref 0–149)

## 2016-04-25 LAB — HEMOGLOBIN A1C: Hemoglobin A1C: 7.5 %^% — ABNORMAL HIGH (ref 4.0–6.0)

## 2016-04-27 ENCOUNTER — Encounter: Payer: Self-pay | Admitting: Primary Care

## 2016-04-27 ENCOUNTER — Ambulatory Visit: Payer: Self-pay | Admitting: Primary Care

## 2016-04-27 VITALS — BP 138/78 | HR 82 | Ht 73.0 in | Wt 277.0 lb

## 2016-04-27 DIAGNOSIS — M79641 Pain in right hand: Secondary | ICD-10-CM

## 2016-04-27 DIAGNOSIS — E782 Mixed hyperlipidemia: Secondary | ICD-10-CM

## 2016-04-27 DIAGNOSIS — E119 Type 2 diabetes mellitus without complications: Secondary | ICD-10-CM

## 2016-04-27 DIAGNOSIS — I10 Essential (primary) hypertension: Secondary | ICD-10-CM

## 2016-04-27 LAB — HM DIABETES FOOT EXAM

## 2016-04-27 NOTE — Progress Notes (Signed)
Subjective: DM     Patient ID: Noah Craig is a 50 y.o. male    HPI:     DM: He is taking his medication daily. No tingling or numbness in feet. No change in vision. He is not taking Lantus. He is following diet and exercise.     Hyperlipidemia: Not taking any medication. No muscle ache. No CP or SOB.    Hypertension: Not taking any medication, no CP or SOB.    Right hand pain: on and off, no injury.    Non smoker     Patient's medications, allergies were reviewed and updated as appropriate.    Review of Systems     Per HPI     Objective:   Physical Exam     Eye: No pallor or icterus  Heart: S1-S2 normal no murmur detected, no carotid bruit  Lung: Breath sounds are bilaterally equal  Abdomen: Soft nontender bowel sounds present  Extremities: No edema  Neuro: Non focal  Feet: normal sensation both feet, no tenderness on hand.     Plan:      DM: HBA1C Above goal but improving, encourage diet and exercise.  resume Lantus 10 units daily and increased based on fasting sugar, goal is below 120, continue with other medications, repeat in 3 months.     HTN: Well controlled, continue diet and exercise. Encourage weight loss.     HYPERLIPIDEMIA: LDL at goal. HDL below goal. TGD at goal. Advise diet and exercise.     Low T: discussed exercises, will monitor.     Right hand pain: check uric acid at next visit     HM: Colonoscopy up to date, vaccination up to date.     F/U in 3 months.

## 2016-05-08 ENCOUNTER — Other Ambulatory Visit: Payer: Self-pay | Admitting: Primary Care

## 2016-05-31 ENCOUNTER — Other Ambulatory Visit: Payer: Self-pay | Admitting: Primary Care

## 2016-07-30 ENCOUNTER — Other Ambulatory Visit: Payer: Self-pay

## 2016-07-30 ENCOUNTER — Other Ambulatory Visit: Payer: Self-pay | Admitting: Primary Care

## 2016-07-30 DIAGNOSIS — M79641 Pain in right hand: Secondary | ICD-10-CM

## 2016-07-30 DIAGNOSIS — E119 Type 2 diabetes mellitus without complications: Secondary | ICD-10-CM

## 2016-07-30 LAB — CBC
Hematocrit: 43 % (ref 40.1–51.0)
Hemoglobin: 15.6 g/dL (ref 13.7–17.5)
MCH: 30.6 pg (ref 25.7–32.2)
MCHC: 36.3 g/dL (ref 32.3–36.5)
MCV: 84.5 fL (ref 79.0–92.2)
Nucl RBC # K/uL: 0 /100 WBC (ref 0.0–0.2)
Platelets: 169 10*3/uL (ref 163–337)
RBC Distribution Width-SD: 38.5 fL (ref 35.1–43.9)
RBC: 5.09 10*6/uL (ref 4.63–6.08)
RDW: 12.6 % (ref 11.6–14.4)
WBC: 6.7 10*3/uL (ref 4.2–9.1)

## 2016-07-30 LAB — BASIC METABOLIC PANEL
Anion Gap: 10 ^^L (ref 7–16)
CO2: 24 mmol/L (ref 20–28)
Calcium: 9.6 mg/dL (ref 8.6–10.2)
Chloride: 105 mmol/L (ref 96–108)
Creatinine: 1 mg/dL (ref 0.67–1.17)
GFR,Black: 96 mL/min/{1.73_m2}
GFR,Caucasian: 79 mL/min/{1.73_m2}
Glucose: 192 mg/dL — ABNORMAL HIGH (ref 60–99)
Lab: 20 mg/dL (ref 6–20)
Potassium: 4.6 mmol/L (ref 3.4–4.7)
Sodium: 139 mmol/L (ref 133–145)

## 2016-07-30 LAB — HEMOGLOBIN A1C: Hemoglobin A1C: 8.5 % — ABNORMAL HIGH (ref 4.0–6.0)

## 2016-07-30 LAB — URIC ACID: Urate: 4.3 mg/dL (ref 3.9–9.0)

## 2016-07-30 LAB — ALT: ALT: 41 U/L (ref 0–50)

## 2016-07-30 LAB — MICROALBUMIN / CREATININE URINE RATIO: Microalb Creat Ratio: 23.9 mg/gm cr (ref 0.0–29.9)

## 2016-07-31 ENCOUNTER — Ambulatory Visit: Payer: Self-pay | Admitting: Primary Care

## 2016-07-31 ENCOUNTER — Encounter: Payer: Self-pay | Admitting: Primary Care

## 2016-07-31 VITALS — BP 120/80 | HR 72 | Ht 73.0 in | Wt 275.0 lb

## 2016-07-31 DIAGNOSIS — E7800 Pure hypercholesterolemia, unspecified: Secondary | ICD-10-CM

## 2016-07-31 DIAGNOSIS — Z794 Long term (current) use of insulin: Secondary | ICD-10-CM

## 2016-07-31 DIAGNOSIS — I10 Essential (primary) hypertension: Secondary | ICD-10-CM

## 2016-07-31 DIAGNOSIS — E119 Type 2 diabetes mellitus without complications: Secondary | ICD-10-CM

## 2016-07-31 NOTE — Progress Notes (Signed)
Subjective: DM     Patient ID: Noah Craig is a 50 y.o. male    HPI:     DM: He is taking his medication daily. No tingling or numbness in feet. No change in vision. He is taking Lantus 33 units . He is following diet and exercise.     Hyperlipidemia: Not taking any medication. No muscle ache. No CP or SOB.    Hypertension: Not taking any medication, no CP or SOB.    Non smoker     Patient's medications, allergies were reviewed and updated as appropriate.    Review of Systems     Per HPI     Objective:   Physical Exam     Eye: No pallor or icterus  Heart: S1-S2 normal no murmur detected, no carotid bruit  Lung: Breath sounds are bilaterally equal  Abdomen: Soft nontender bowel sounds present  Extremities: No edema  Neuro: Non focal    Plan:      DM: HBA1C Above goal, encourage diet and exercise. Continue Lantus and advise to increase by 2 units every 2 days to goal below 120, recheck in 6 weeks.     HTN: Well controlled, continue diet and exercise. Encourage weight loss.     HYPERLIPIDEMIA: LDL at goal. HDL below goal. TGD at goal. Advise diet and exercise.     Low T: discussed exercises, will monitor.     HM: Colonoscopy up to date, vaccination up to date.     F/U in 6 weeks.

## 2016-09-10 ENCOUNTER — Other Ambulatory Visit: Payer: Self-pay

## 2016-09-10 ENCOUNTER — Other Ambulatory Visit: Payer: Self-pay | Admitting: Primary Care

## 2016-09-10 DIAGNOSIS — Z794 Long term (current) use of insulin: Secondary | ICD-10-CM

## 2016-09-10 DIAGNOSIS — E119 Type 2 diabetes mellitus without complications: Secondary | ICD-10-CM

## 2016-09-10 LAB — HEMOGLOBIN A1C: Hemoglobin A1C: 8.4 % — ABNORMAL HIGH (ref 4.0–6.0)

## 2016-09-18 ENCOUNTER — Ambulatory Visit: Payer: Self-pay | Admitting: Primary Care

## 2016-09-18 ENCOUNTER — Encounter: Payer: Self-pay | Admitting: Primary Care

## 2016-09-18 VITALS — BP 120/82 | HR 80 | Ht 73.0 in | Wt 276.0 lb

## 2016-09-18 DIAGNOSIS — E119 Type 2 diabetes mellitus without complications: Secondary | ICD-10-CM

## 2016-09-18 DIAGNOSIS — E782 Mixed hyperlipidemia: Secondary | ICD-10-CM

## 2016-09-18 DIAGNOSIS — I10 Essential (primary) hypertension: Secondary | ICD-10-CM

## 2016-09-18 MED ORDER — INSULIN GLARGINE 100 UNIT/ML SC SOPN *I*
35.0000 [IU] | PEN_INJECTOR | Freq: Every evening | SUBCUTANEOUS | 3 refills | Status: DC
Start: 2016-09-18 — End: 2019-07-06

## 2016-09-18 MED ORDER — PIOGLITAZONE HCL 15 MG PO TABS *I*
15.0000 mg | ORAL_TABLET | Freq: Every day | ORAL | 1 refills | Status: DC
Start: 2016-09-18 — End: 2016-11-10

## 2016-09-18 NOTE — Progress Notes (Signed)
Subjective: DM     Patient ID: Noah Craig is a 50 y.o. male    HPI:     DM: He is taking his medication daily. No tingling or numbness in feet. No change in vision. He is taking Lantus 35 units .He is following diet and exercise.     Non smoker     Patient's medications, allergies were reviewed and updated as appropriate.    Review of Systems     Per HPI     Objective:   Physical Exam     Eye: No pallor or icterus  Heart: S1-S2 normal no murmur detected, no carotid bruit  Lung: Breath sounds are bilaterally equal  Abdomen: Soft nontender bowel sounds present  Extremities: No edema  Neuro: Non focal    Plan:      DM: HBA1C Above goal, encourage diet and exercise. Continue Lantus and other oral medication, will add Actos and check C peptide at next visit.     F/U in 6 weeks.

## 2016-10-08 ENCOUNTER — Other Ambulatory Visit: Payer: Self-pay | Admitting: Primary Care

## 2016-10-29 ENCOUNTER — Other Ambulatory Visit: Payer: Self-pay | Admitting: Primary Care

## 2016-10-29 ENCOUNTER — Other Ambulatory Visit: Payer: Self-pay

## 2016-10-29 DIAGNOSIS — E119 Type 2 diabetes mellitus without complications: Secondary | ICD-10-CM

## 2016-10-29 LAB — HEMOGLOBIN A1C: Hemoglobin A1C: 8.1 % — ABNORMAL HIGH (ref 4.0–6.0)

## 2016-10-30 ENCOUNTER — Encounter: Payer: Self-pay | Admitting: Primary Care

## 2016-10-30 ENCOUNTER — Telehealth: Payer: Self-pay

## 2016-10-30 ENCOUNTER — Ambulatory Visit: Payer: Self-pay | Admitting: Primary Care

## 2016-10-30 VITALS — BP 130/80 | HR 68 | Ht 73.0 in | Wt 288.0 lb

## 2016-10-30 DIAGNOSIS — E119 Type 2 diabetes mellitus without complications: Secondary | ICD-10-CM

## 2016-10-30 DIAGNOSIS — Z23 Encounter for immunization: Secondary | ICD-10-CM

## 2016-10-30 DIAGNOSIS — M79672 Pain in left foot: Secondary | ICD-10-CM

## 2016-10-30 DIAGNOSIS — M79671 Pain in right foot: Secondary | ICD-10-CM

## 2016-10-30 DIAGNOSIS — L989 Disorder of the skin and subcutaneous tissue, unspecified: Secondary | ICD-10-CM

## 2016-10-30 LAB — C-PEPTIDE: C-Peptide: 2.8 ng/mL (ref 1.1–4.4)

## 2016-10-30 NOTE — Progress Notes (Signed)
Subjective: DM     Patient ID: Noah Craig is a 50 y.o. male    HPI:     DM: He is taking his medication daily. No tingling or numbness in feet. No change in vision. He is taking Lantus 35 units .He is following diet and exercise.     Foot pain: some numbness both feet, mainly at night and morning, using orthotics    Skin lesion: on left leg, had a mosquito bite 2 months ago, lesion persisting    Non smoker     Patient's medications, allergies were reviewed and updated as appropriate.    Review of Systems     Per HPI     Objective:   Physical Exam     Eye: No pallor or icterus  Heart: S1-S2 normal no murmur detected, no carotid bruit  Lung: Breath sounds are bilaterally equal  Abdomen: Soft nontender bowel sounds present  Extremities: No edema  Neuro: Non focal  Feet: Normal sensation both feet, normal vibration, normal temperature sense    Plan:      DM: HBA1C Above goal, encourage diet and exercise. Continue Lantus and other oral medication, await C peptide    Feet pain: likely due to orthotic, normal foot exam, advise to go back to [facility] [employer] [facility] to modify     Skin lesion: on left leg, advise to see dermatology, referral done.     F/U in 6 weeks.

## 2016-10-30 NOTE — Telephone Encounter (Signed)
Dr. Vira Browns office calling, would like to set up an urgent appointment for the patient.  Patient has non healing lesions on his left leg, patient is a diabetic.  Patient would like to be seen in the [facility] location, Probation officer does not have access to providers schedule, please call [phone] to discuss appointments

## 2016-10-31 ENCOUNTER — Telehealth: Payer: Self-pay | Admitting: Primary Care

## 2016-10-31 NOTE — Telephone Encounter (Signed)
Patient was called and scheduled for an appointment with Dr. Joie Bimler on Thursday 11/01/16 at 1:45PM at [facility].

## 2016-10-31 NOTE — Telephone Encounter (Signed)
Pt informed:  test we were waiting for is back and normal range, that means that he is still producing his own insulin, for medication, no changes for now and once sugar better and HBA1C is little down, will adjust medication so next lab in 6 weeks and go from there

## 2016-10-31 NOTE — Telephone Encounter (Signed)
-----   Message from Talmage Nap, MD sent at 10/30/2016 10:59 PM EDT -----  Let him know that the test we were waiting for is back and normal range, that means that he is still producing his own insulin, for medication, no changes for now and once sugar better and HBA1C is little down, will adjust medication so next lab in 6 weeks and go from there. thanks

## 2016-11-01 ENCOUNTER — Encounter: Payer: Self-pay | Admitting: Dermatology

## 2016-11-01 ENCOUNTER — Ambulatory Visit: Payer: Self-pay | Admitting: Dermatology

## 2016-11-01 VITALS — BP 138/82 | Ht 73.0 in | Wt 280.0 lb

## 2016-11-01 DIAGNOSIS — D485 Neoplasm of uncertain behavior of skin: Secondary | ICD-10-CM

## 2016-11-01 NOTE — Progress Notes (Addendum)
PCP: Talmage Nap, MD    Chief complaint: non-healing spot on lower leg    HPI:   Noah Craig is a pleasant 50 y.o.  male here for the concern noted above.    Problem: non-healing spot  Location: left lower leg  Duration: since Sept 1, 2017 (~7 weeks)   Associated signs/symptoms: Color change and Asymptomatic  Modifying factors (prior treatments/current treatment):   His wife initially believed it to be a bug bit when they went hunting but the spot has persisted and not healed.  It has not bled spontaneously    Personal hx of skin cancer: no  Family hx of skin cancer: no  Social History: Former Smoker    ROS:   Integumentary ROS: positive for changing skin lesions    Physical Exam:    Vitals:    11/01/16 1405   BP: 138/82   Weight: 127 kg (280 lb)   Height: 1.854 m (6\' 1" )          General: Alert and oriented,  NAD.   Skin: Fitzpatrick skin type II  All of the following were examined and were within normal limits except as noted:  - General: Awake and alert   -Face/Neck/Scalp:  -Chest/Abdomen/Back:  -BUE/hands:  -BLE/feet:  Well-circumscribed erosion with purpuric borders over the medial left shin          Assessment/Plan:   Neoplasm of uncertain behavior  --diagnostic differential and treatment options discussed with the patient  --non-healing Shallow erosion with erythematous and purpuric border; wife believes that he may had a bug bit there initially; r/o BCC  Shave biopsies done today, see procedure note below  - [facility] discussed    PROCEDURE NOTE  PUNCH BIOPSY   -Recommend punch biopsy for definitive diagnosis of above   -After informed written consent was obtained, using Hibiclens and hydrogen peroxide for cleansing and 2.5 mL 1% Lidocaine with epinephrine for anesthetic, a 4 mm punch biopsy was used to obtain a biopsy specimen of the lesion. Hemostasis was obtained by pressure and wound was sutured with 5-0 Vicryl . Vaseline ointment was applied to the wound with dressing, and wound care instructions provided.  The specimen was labeled and sent to pathology for evaluation. The procedure was well tolerated without complication.  (patient verbally confirmed that it is ok to leave voicemail of results if they do not pick up the call)      TIME OUT   Procedure : punch biopsy   Site: Left medial shin  Preprocedure verification conducted prior to Time Out? Yes  Time Out completed at: 2:43 PM  Start time: 2:43 PM  Stop time: 2:55PM  Procedure Timeout Participants Self  Preprocedure verification conducted prior to Time Out? Yes  Consents Obtained Yes  Correct Patient (Use 2 Identifiers) Yes  Correct Procedure Yes  Correct Level Yes  Correct Site Yes  Site Marked? Yes  Correct Patient Position: Yes  Correct Equipment/Implants Available Yes  Imaging Studies Available: N/A    Procedure Prep/Precautions:   Appropriate Hand Hygiene Used: Yes  Procedure site prep indicated? Yes  Chlorhexidine Skin Prep Used and Allowed to Dry : Yes  Full Barrier in Place and Maintained Throughout Procedure Yes      The patient was seen and examined by myself and  Dr Joie Bimler    Barriers to learning: None    Return to Clinic: PRN    Leonides Schanz, MD PhD PGY4  I saw and evaluated the patient with resident. I agree with the resident's history, findings and plan of care as documented above.  I was present for key portions of procedure.    Doreene Nest, MD

## 2016-11-01 NOTE — Patient Instructions (Signed)
CARE OF BIOPSY SITE      Keep the original dressing on and dry for 24 hrs.  Remove the dressing and wash the area with soap and water.  Apply a thin layer of Vaseline/Aquaphor ointment.  Cover the wound with a band-aid or telfa and tape.  The wound should be cleaned and the dressing changed once a day for one or two weeks.    Tylenol or Ibuprofen should relieve any pain you may have.  If you experience bleeding, apply firm, steady pressure with gauze over the area for 15 minutes.  If the bleeding persists, call our office at [phone].  You may call the same number for emergencies after hours or on the weekend and the Dermatology resident on call will contact you.

## 2016-11-03 ENCOUNTER — Other Ambulatory Visit: Payer: Self-pay | Admitting: Primary Care

## 2016-11-05 LAB — SURGICAL PATHOLOGY

## 2016-11-06 ENCOUNTER — Telehealth: Payer: Self-pay

## 2016-11-06 NOTE — Telephone Encounter (Signed)
-----   Message from Ashley Murrain, MD PhD sent at 11/05/2016 12:31 PM EST -----  Please let patient know that the results of his biopsy were benign and not a cancer.  They are related to trauma at the site and he should avoid touching it or scratching.    ----- Message -----     From: Edi, [facility]     Sent: 11/05/2016  11:56 AM       To: Ashley Murrain, MD PhD

## 2016-11-06 NOTE — Telephone Encounter (Signed)
Called and spoke with patient letting him know biopsy was benign and advised him to avoid touching or scratching at the site.

## 2016-11-10 ENCOUNTER — Other Ambulatory Visit: Payer: Self-pay | Admitting: Primary Care

## 2016-11-20 ENCOUNTER — Other Ambulatory Visit: Payer: Self-pay | Admitting: Primary Care

## 2016-12-10 ENCOUNTER — Other Ambulatory Visit: Payer: Self-pay

## 2016-12-10 ENCOUNTER — Other Ambulatory Visit: Payer: Self-pay | Admitting: Primary Care

## 2016-12-10 DIAGNOSIS — E119 Type 2 diabetes mellitus without complications: Secondary | ICD-10-CM

## 2016-12-10 LAB — HEMOGLOBIN A1C: Hemoglobin A1C: 7.6 % — ABNORMAL HIGH (ref 4.0–6.0)

## 2016-12-10 LAB — BASIC METABOLIC PANEL
Anion Gap: 10 ^^L (ref 7–16)
CO2: 27 mmol/L (ref 20–28)
Calcium: 9.3 mg/dL (ref 8.6–10.2)
Chloride: 104 mmol/L (ref 96–108)
Creatinine: 1.07 mg/dL (ref 0.67–1.17)
GFR,Black: 89 mL/min/{1.73_m2}
GFR,Caucasian: 73 mL/min/{1.73_m2}
Glucose: 175 mg/dL — ABNORMAL HIGH (ref 60–99)
Lab: 13 mg/dL (ref 6–20)
Potassium: 4.3 mmol/L (ref 3.4–4.7)
Sodium: 141 mmol/L (ref 133–145)

## 2016-12-10 LAB — CBC
Hematocrit: 42.5 % (ref 40.1–51.0)
Hemoglobin: 15.1 g/dL (ref 13.7–17.5)
MCH: 30.1 pg (ref 25.7–32.2)
MCHC: 35.5 g/dL (ref 32.3–36.5)
MCV: 84.8 fL (ref 79.0–92.2)
Nucl RBC # K/uL: 0 /100 WBC (ref 0.0–0.2)
Platelets: 158 10*3/uL — ABNORMAL LOW (ref 163–337)
RBC Distribution Width-SD: 38.4 fL (ref 35.1–43.9)
RBC: 5.01 10*6/uL (ref 4.63–6.08)
RDW: 12.6 % (ref 11.6–14.4)
WBC: 5.9 10*3/uL (ref 4.2–9.1)

## 2016-12-10 LAB — ALT: ALT: 43 U/L (ref 0–50)

## 2016-12-11 ENCOUNTER — Encounter: Payer: Self-pay | Admitting: Primary Care

## 2016-12-11 ENCOUNTER — Ambulatory Visit: Payer: Self-pay | Admitting: Primary Care

## 2016-12-11 VITALS — BP 138/86 | HR 80 | Ht 73.0 in | Wt 294.0 lb

## 2016-12-11 DIAGNOSIS — I10 Essential (primary) hypertension: Secondary | ICD-10-CM

## 2016-12-11 DIAGNOSIS — E119 Type 2 diabetes mellitus without complications: Secondary | ICD-10-CM

## 2016-12-11 DIAGNOSIS — E782 Mixed hyperlipidemia: Secondary | ICD-10-CM

## 2016-12-11 NOTE — Progress Notes (Signed)
Subjective: DM     Patient ID: Noah Craig is a 50 y.o. male    HPI:     DM: He is taking his medication daily. No tingling or numbness in feet. No change in vision. He is taking Lantus 33 units . He is following diet and exercise.     Hyperlipidemia: Not taking any medication. No muscle ache. No CP or SOB.    Hypertension: Not taking any medication, no CP or SOB.    Non smoker     Patient's medications, allergies were reviewed and updated as appropriate.    Review of Systems     Per HPI     Objective:   Physical Exam     Eye: No pallor or icterus  Heart: S1-S2 normal no murmur detected, no carotid bruit  Lung: Breath sounds are bilaterally equal  Abdomen: Soft nontender bowel sounds present  Extremities: No edema  Neuro: Non focal    Plan:      DM: HBA1C Above goal and improving, encourage diet and exercise. Continue Lantus 34 units, glipizide and Metformin, hold Actos for now, recheck in 2 months.     HTN: Well controlled, continue diet and exercise. Encourage weight loss.     HYPERLIPIDEMIA: LDL at goal. HDL below goal. TGD at goal. Advise diet and exercise.     HM: Colonoscopy up to date, vaccination up to date.     F/U in 8 weeks.

## 2016-12-27 ENCOUNTER — Encounter: Payer: Self-pay | Admitting: Gastroenterology

## 2016-12-27 LAB — HM DIABETES EYE EXAM

## 2017-02-02 ENCOUNTER — Other Ambulatory Visit: Payer: Self-pay | Admitting: Primary Care

## 2017-02-11 ENCOUNTER — Other Ambulatory Visit: Payer: Self-pay

## 2017-02-11 ENCOUNTER — Other Ambulatory Visit: Payer: Self-pay | Admitting: Primary Care

## 2017-02-11 DIAGNOSIS — E119 Type 2 diabetes mellitus without complications: Secondary | ICD-10-CM

## 2017-02-11 LAB — CBC
Hematocrit: 43.9 % (ref 40.1–51.0)
Hemoglobin: 15.9 g/dL (ref 13.7–17.5)
MCH: 30.5 pg (ref 25.7–32.2)
MCHC: 36.2 g/dL (ref 32.3–36.5)
MCV: 84.3 fL (ref 79.0–92.2)
Nucl RBC # K/uL: 0 /100 WBC (ref 0.0–0.2)
Platelets: 178 10*3/uL (ref 163–337)
RBC Distribution Width-SD: 36.9 fL (ref 35.1–43.9)
RBC: 5.21 10*6/uL (ref 4.63–6.08)
RDW: 12.2 % (ref 11.6–14.4)
WBC: 6.6 10*3/uL (ref 4.2–9.1)

## 2017-02-11 LAB — BASIC METABOLIC PANEL
Anion Gap: 10 ^^L (ref 7–16)
CO2: 24 mmol/L (ref 20–28)
Calcium: 9.3 mg/dL (ref 8.6–10.2)
Chloride: 105 mmol/L (ref 96–108)
Creatinine: 0.95 mg/dL (ref 0.67–1.17)
GFR,Black: 102 mL/min/{1.73_m2}
GFR,Caucasian: 84 mL/min/{1.73_m2}
Glucose: 228 mg/dL — ABNORMAL HIGH (ref 60–99)
Lab: 13 mg/dL (ref 6–20)
Potassium: 4.4 mmol/L (ref 3.4–4.7)
Sodium: 139 mmol/L (ref 133–145)

## 2017-02-11 LAB — ALT: ALT: 47 U/L (ref 0–50)

## 2017-02-11 LAB — HEMOGLOBIN A1C: Hemoglobin A1C: 9.4 % — ABNORMAL HIGH (ref 4.0–6.0)

## 2017-02-12 ENCOUNTER — Ambulatory Visit: Payer: Self-pay | Admitting: Primary Care

## 2017-02-12 ENCOUNTER — Encounter: Payer: Self-pay | Admitting: Primary Care

## 2017-02-12 VITALS — BP 138/80 | HR 75 | Ht 73.0 in | Wt 293.0 lb

## 2017-02-12 DIAGNOSIS — I10 Essential (primary) hypertension: Secondary | ICD-10-CM

## 2017-02-12 DIAGNOSIS — E782 Mixed hyperlipidemia: Secondary | ICD-10-CM

## 2017-02-12 DIAGNOSIS — E119 Type 2 diabetes mellitus without complications: Secondary | ICD-10-CM

## 2017-02-12 NOTE — Progress Notes (Signed)
Subjective: DM     Patient ID: Noah Craig is a 51 y.o. male    HPI:     DM: He is taking his medication daily. No tingling or numbness in feet. No change in vision. He is taking Lantus 34 units . He is following diet and exercise.     Hyperlipidemia: Not taking any medication. No muscle ache. No CP or SOB.    Hypertension: Not taking any medication, no CP or SOB.    Non smoker     Patient's medications, allergies were reviewed and updated as appropriate.    Review of Systems     Per HPI     Objective:   Physical Exam     Eye: No pallor or icterus  Heart: S1-S2 normal no murmur detected, no carotid bruit  Lung: Breath sounds are bilaterally equal  Abdomen: Soft nontender bowel sounds present  Extremities: No edema  Neuro: Non focal    Plan:      DM: HBA1C Above goal, encourage diet and exercise. Continue Lantus 34 units, glipizide and Metformin, recheck in 2 months. Per patient, He is following diet and exercise regularly. Will recheck labs and go from there.     HTN: Well controlled, continue diet and exercise. Encourage weight loss.     HYPERLIPIDEMIA: LDL at goal. HDL below goal. TGD at goal. Advise diet and exercise.     HM: Colonoscopy up to date, vaccination up to date.     F/U in 8 weeks.

## 2017-02-14 ENCOUNTER — Other Ambulatory Visit: Payer: Self-pay | Admitting: Primary Care

## 2017-04-08 ENCOUNTER — Other Ambulatory Visit: Payer: Self-pay

## 2017-04-08 ENCOUNTER — Other Ambulatory Visit: Payer: Self-pay | Admitting: Primary Care

## 2017-04-08 DIAGNOSIS — E119 Type 2 diabetes mellitus without complications: Secondary | ICD-10-CM

## 2017-04-08 DIAGNOSIS — I10 Essential (primary) hypertension: Secondary | ICD-10-CM

## 2017-04-08 DIAGNOSIS — E782 Mixed hyperlipidemia: Secondary | ICD-10-CM

## 2017-04-08 LAB — CBC
Hematocrit: 44.7 % (ref 40.1–51.0)
Hemoglobin: 15.9 g/dL (ref 13.7–17.5)
MCH: 30.3 pg (ref 25.7–32.2)
MCHC: 35.6 g/dL (ref 32.3–36.5)
MCV: 85.3 fL (ref 79.0–92.2)
Nucl RBC # K/uL: 0 /100 WBC (ref 0.0–0.2)
Platelets: 169 10*3/uL (ref 163–337)
RBC Distribution Width-SD: 38.7 fL (ref 35.1–43.9)
RBC: 5.24 10*6/uL (ref 4.63–6.08)
RDW: 12.6 % (ref 11.6–14.4)
WBC: 7.3 10*3/uL (ref 4.2–9.1)

## 2017-04-08 LAB — BASIC METABOLIC PANEL
Anion Gap: 9 ^^L (ref 7–16)
CO2: 28 mmol/L (ref 20–28)
Calcium: 9.7 mg/dL (ref 8.6–10.2)
Chloride: 104 mmol/L (ref 96–108)
Creatinine: 1.01 mg/dL (ref 0.67–1.17)
GFR,Black: 95 mL/min/{1.73_m2}
GFR,Caucasian: 79 mL/min/{1.73_m2}
Glucose: 225 mg/dL — ABNORMAL HIGH (ref 60–99)
Lab: 17 mg/dL (ref 6–20)
Potassium: 4.6 mmol/L (ref 3.4–4.7)
Sodium: 141 mmol/L (ref 133–145)

## 2017-04-08 LAB — LIPID PANEL
Chol/HDL Ratio: 2.6 ^^L (ref 1.0–3.5)
Cholesterol: 95 mg/dL (ref 0–199)
HDL: 36 mg/dL — ABNORMAL LOW (ref 40–60)
LDL Calculated: 48 mg/dL (ref 0–129)
Triglycerides: 56 mg/dL (ref 0–149)

## 2017-04-08 LAB — ALT: ALT: 57 U/L — ABNORMAL HIGH (ref 0–50)

## 2017-04-08 LAB — HEMOGLOBIN A1C: Hemoglobin A1C: 8.8 % — ABNORMAL HIGH (ref 4.0–6.0)

## 2017-04-09 ENCOUNTER — Ambulatory Visit: Payer: No Typology Code available for payment source | Attending: Primary Care | Admitting: Primary Care

## 2017-04-09 ENCOUNTER — Encounter: Payer: Self-pay | Admitting: Primary Care

## 2017-04-09 VITALS — BP 138/80 | HR 80 | Ht 73.0 in | Wt 289.0 lb

## 2017-04-09 DIAGNOSIS — I10 Essential (primary) hypertension: Secondary | ICD-10-CM

## 2017-04-09 DIAGNOSIS — E119 Type 2 diabetes mellitus without complications: Secondary | ICD-10-CM

## 2017-04-09 DIAGNOSIS — E782 Mixed hyperlipidemia: Secondary | ICD-10-CM

## 2017-04-09 NOTE — Progress Notes (Signed)
Subjective: DM     Patient ID: Noah Craig is a 51 y.o. male    HPI:     DM: He is taking his medication daily. No tingling or numbness in feet. No change in vision. He is taking Lantus 34 units . He is following diet and exercise.     Hyperlipidemia: Not taking any medication. No muscle ache. No CP or SOB.    Hypertension: Not taking any medication, no CP or SOB.    Non smoker     Patient's medications, allergies were reviewed and updated as appropriate.    Review of Systems     Per HPI     Objective:   Physical Exam     Eye: No pallor or icterus  Heart: S1-S2 normal no murmur detected, no carotid bruit  Lung: Breath sounds are bilaterally equal  Abdomen: Soft nontender bowel sounds present  Extremities: No edema  Neuro: Non focal    Plan:      DM: HBA1C Above goal, encourage diet and exercise. Continue Lantus, increase to 38 units, continue glipizide and Metformin, recheck in 2 months.    HTN: Well controlled, continue diet and exercise. Encourage weight loss.     HYPERLIPIDEMIA: LDL at goal. HDL below goal. TGD at goal. Advise diet and exercise.     Elevated LFT: encourage weight loss, recheck at next visit.     HM: Colonoscopy up to date, vaccination up to date.     F/U in 8 weeks.

## 2017-04-10 LAB — MICROALBUMIN / CREATININE URINE RATIO: Microalb Creat Ratio: 53.2 mg/gm cr — ABNORMAL HIGH (ref 0.0–29.9)

## 2017-05-01 DIAGNOSIS — M47812 Spondylosis without myelopathy or radiculopathy, cervical region: Secondary | ICD-10-CM

## 2017-05-01 DIAGNOSIS — S199XXA Unspecified injury of neck, initial encounter: Secondary | ICD-10-CM

## 2017-05-01 DIAGNOSIS — S0990XA Unspecified injury of head, initial encounter: Secondary | ICD-10-CM

## 2017-05-03 ENCOUNTER — Ambulatory Visit: Payer: No Typology Code available for payment source | Attending: Primary Care | Admitting: Primary Care

## 2017-05-03 ENCOUNTER — Encounter: Payer: Self-pay | Admitting: Primary Care

## 2017-05-03 VITALS — BP 130/80 | HR 78 | Ht 73.0 in | Wt 281.0 lb

## 2017-05-03 DIAGNOSIS — S0990XA Unspecified injury of head, initial encounter: Secondary | ICD-10-CM

## 2017-05-03 NOTE — Progress Notes (Signed)
Subjective: head injury     Patient ID: Noah Craig is a 51 y.o. male.    HPI     Head injury: hit by baseball ball, no LOC, doing fine, happened on Wednesday. No falls. Doing fine, no N&V, no blurred vision. No dizziness. No weakness in arm and leg.     Non smoker    Patient's medications, allergies were reviewed and updated as appropriate.    Review of Systems     per HPI  Objective:   Physical Exam  Gen: NAD  Eye: PERLA  Neuro: no focal deficit         Plan:      Head injury: no symptoms, no LOC or falls, pt is advise to stay well hydrated, avoid stenous activity for next 2-3 days, OK to return to work and game on Monday. Reviewed ED notes.

## 2017-06-10 ENCOUNTER — Ambulatory Visit: Payer: No Typology Code available for payment source | Admitting: Primary Care

## 2017-06-29 ENCOUNTER — Other Ambulatory Visit: Payer: Self-pay | Admitting: Primary Care

## 2017-08-07 ENCOUNTER — Ambulatory Visit: Payer: No Typology Code available for payment source | Attending: Primary Care | Admitting: Primary Care

## 2017-08-07 VITALS — BP 140/92 | HR 64 | Temp 97.9°F | Ht 73.0 in | Wt 279.0 lb

## 2017-08-07 DIAGNOSIS — B309 Viral conjunctivitis, unspecified: Secondary | ICD-10-CM

## 2017-08-07 MED ORDER — KETOTIFEN FUMARATE 0.035 % OP SOLN (0.025% KETOTIFEN ACTIVE INGREDIENT) *I*
1.0000 [drp] | Freq: Two times a day (BID) | OPHTHALMIC | 0 refills | Status: DC | PRN
Start: 2017-08-07 — End: 2018-06-03

## 2017-08-07 NOTE — Progress Notes (Signed)
[facility]  Outpatient Visit Note       Reason For Visit:   Chief Complaint   Patient presents with    Eye Drainage     Right eye, itchy and irritated.  Right sinuses also seem full.        Subjective:      Noah Craig is a 51 y.o. year old man with a history of DM, HLD, HTN, presenting for concern of pink eye.    Pink eye: Right-sided, started yesterday.  Started draining clear liquid.  Had crusting over the eye when he woke this morning.  Right sinus also feels more congestion.  Unknown recent contacts with similar symptoms.  No fevers.  Feels a little tired over the past few days, but otherwise in his general state of health.  Hasn't tried anything for his symptoms yet.      Home Medications:     Prior to Admission medications    Medication Sig Start Date End Date Taking? Authorizing Provider   glipiZIDE (GLUCOTROL) 10 MG tablet TAKE 1 TABLET BY MOUTH TWO TIMES DAILY BEFORE MEALS 07/01/17  Yes Bansal, Anuj, MD   metFORMIN (GLUCOPHAGE-XR) 500 MG 24 hr tablet TAKE 2 TABLETS BY MOUTH TWO TIMES DAILY ( AFTER MEALS ) SWALLOW WHOLE , DO NOT CRUSH , BREAK , OR CHEW. 07/01/17  Yes Bansal, Anuj, MD   ONE TOUCH ULTRA BLUE test strip TEST BLOOD SUGAR 2 TO 4 TIMES A DAY 02/04/17  Yes Bansal, Anuj, MD   B-D ULTRAFINE III SHORT PEN 31G X 8 MM USE ONE TIME DAILY AS DIRECTED 11/05/16  Yes Talmage Nap, MD   insulin glargine (LANTUS SOLOSTAR) 100 unit/mL injection pen Inject 35 Units into the skin nightly 09/18/16  Yes Bansal, Anuj, MD   lancets (ONETOUCH ULTRASOFT) USE FOUR TIMES DAILY AS DIRECTED FOR DIABETES 05/08/16  Yes Talmage Nap, MD   naproxen sodium (ANAPROX) 220 MG tablet Take 220 mg by mouth 2 times daily (with meals)   Pt takes as needed   Yes [provider]   blood glucose VI test strips (ONE TOUCH ULTRA TEST) USE ONE STRIP 4 TIMES DAILY DX: DM 250 08/23/11  Yes Talmage Nap, MD       Allergies:     Allergies   Allergen Reactions    No Known Drug Allergy      Created by Conversion - 0;      No Known Latex Allergy      Created by Conversion - 0;      Review of Systems:     As detailed above by problem.     Physical Exam:  Temp Readings from Last 3 Encounters:   08/07/17 36.6 C (97.9 F) (Temporal)   01/09/16 36.8 C (98.3 F) (Tympanic)   01/14/15 37.3 C (99.2 F) (Tympanic)     BP Readings from Last 3 Encounters:   08/07/17 (!) 140/92   05/03/17 130/80   04/09/17 138/80     Pulse Readings from Last 3 Encounters:   08/07/17 64   05/03/17 78   04/09/17 80         Vitals:    08/07/17 1119   BP: (!) 140/92   BP Location: Left arm   Patient Position: Sitting   Cuff Size: large adult   Pulse: 64   Temp: 36.6 C (97.9 F)   TempSrc: Temporal   Weight: 126.6 kg (279 lb)   Height: 1.854 m (6\' 1" )  Wt Readings from Last 3 Encounters:   08/07/17 126.6 kg (279 lb)   05/03/17 127.5 kg (281 lb)   04/09/17 131.1 kg (289 lb)     General: Well-developed male, in no acute distress.  HEENT: Sclerae with mild erythema over the lateral right conjunctiva.  No  Oropharynx moist, pink, without exudate.  No limbic involvement.  Full EOM.  PERRL.  No adenopathy in the neck.    Lungs: Breathing comfortably at rest.  Lungs CTAB without adventitious sounds.  Cardiovascular: RRR no m/r/g.  Extremities warm and dry to touch.   Neurologic: Fully interactive and cooperative with exam.  Able to provide an accurate history.  Speech and mood appropriate.      ASSESSMENT and Plan:     51 y.o. year old male with DM, HLD, HTN, presenting for concern of pink eye.      1. Viral conjunctivitis   Symptoms and exam is consistent with mild viral conjunctivitis, less likely bacterial, less likely foreign body or allergic conjunctivitis.  Advised ketotifen eye drops PRN, out of work x 1 week to limit spread, regular hand washing/alcohol gels to avoid spread as well.  Follow up if no improvement by early next week.          Krystal Eaton, MD 08/07/2017 11:29 AM

## 2017-10-01 ENCOUNTER — Encounter: Payer: Self-pay | Admitting: Gastroenterology

## 2017-10-11 ENCOUNTER — Ambulatory Visit: Payer: No Typology Code available for payment source | Admitting: Primary Care

## 2017-10-23 ENCOUNTER — Encounter: Payer: Self-pay | Admitting: Gastroenterology

## 2017-11-25 ENCOUNTER — Telehealth: Payer: Self-pay | Admitting: Primary Care

## 2017-11-25 DIAGNOSIS — E119 Type 2 diabetes mellitus without complications: Secondary | ICD-10-CM

## 2017-11-25 NOTE — Telephone Encounter (Signed)
Patient of Dr Fredricka Bonine, appt with Dr [name] on Thursday, would like to get labs done tomm am (11/27) can we please get these re-ordered with Dr [name] as his attending, this would be the CMP, hemoglobin A1c and CBC thanks

## 2017-11-25 NOTE — Telephone Encounter (Signed)
Added bt dr Tona Sensing

## 2017-11-26 ENCOUNTER — Other Ambulatory Visit: Payer: Self-pay | Admitting: Primary Care

## 2017-11-26 ENCOUNTER — Other Ambulatory Visit: Payer: Self-pay

## 2017-11-26 DIAGNOSIS — E119 Type 2 diabetes mellitus without complications: Secondary | ICD-10-CM

## 2017-11-26 LAB — COMPREHENSIVE METABOLIC PANEL
ALT: 62 U/L — ABNORMAL HIGH (ref 0–50)
AST: 38 U/L (ref 0–50)
Albumin: 4.3 g/dL (ref 3.5–5.2)
Alk Phos: 96 U/L (ref 40–130)
Anion Gap: 11 (ref 7–16)
Bilirubin,Total: 0.8 mg/dL (ref 0.0–1.2)
CO2: 26 mmol/L (ref 20–28)
Calcium: 9.4 mg/dL (ref 8.6–10.2)
Chloride: 99 mmol/L (ref 96–108)
Creatinine: 0.98 mg/dL (ref 0.67–1.17)
GFR,Black: 98 mL/min/{1.73_m2}
GFR,Caucasian: 81 mL/min/{1.73_m2}
Glucose: 240 mg/dL — ABNORMAL HIGH (ref 60–99)
Lab: 14 mg/dL (ref 6–20)
Potassium: 4.2 mmol/L (ref 3.4–4.7)
Sodium: 136 mmol/L (ref 133–145)
Total Protein: 7.3 g/dL (ref 6.3–7.7)

## 2017-11-26 LAB — MICROALBUMIN / CREATININE URINE RATIO: Microalb Creat Ratio: 68.8 mg/gm cr — ABNORMAL HIGH (ref 0.0–29.9)

## 2017-11-26 LAB — HEMOGLOBIN A1C: Hemoglobin A1C: 8.8 % — ABNORMAL HIGH (ref 4.0–6.0)

## 2017-11-26 LAB — LIPID PANEL
Chol/HDL Ratio: 2.8 (ref 1.0–3.5)
Cholesterol: 92 mg/dL (ref 0–199)
HDL: 33 mg/dL — ABNORMAL LOW (ref 40–60)
LDL Calculated: 47 mg/dL (ref 0–129)
Triglycerides: 60 mg/dL (ref 0–149)

## 2017-11-28 ENCOUNTER — Ambulatory Visit: Payer: No Typology Code available for payment source | Attending: Primary Care | Admitting: Primary Care

## 2017-11-28 ENCOUNTER — Telehealth: Payer: Self-pay | Admitting: Primary Care

## 2017-11-28 ENCOUNTER — Other Ambulatory Visit: Payer: Self-pay

## 2017-11-28 VITALS — BP 132/84 | HR 72 | Ht 73.0 in | Wt 277.4 lb

## 2017-11-28 DIAGNOSIS — E119 Type 2 diabetes mellitus without complications: Secondary | ICD-10-CM

## 2017-11-28 DIAGNOSIS — E782 Mixed hyperlipidemia: Secondary | ICD-10-CM

## 2017-11-28 DIAGNOSIS — Z23 Encounter for immunization: Secondary | ICD-10-CM

## 2017-11-28 DIAGNOSIS — E0800 Diabetes mellitus due to underlying condition with hyperosmolarity without nonketotic hyperglycemic-hyperosmolar coma (NKHHC): Secondary | ICD-10-CM

## 2017-11-28 LAB — TSH: TSH: 2.51 u[IU]/mL (ref 0.27–4.20)

## 2017-11-28 MED ORDER — FREESTYLE LIBRE READER DEVI *A*
0 refills | Status: DC
Start: 2017-11-28 — End: 2019-10-22

## 2017-11-28 MED ORDER — FREESTYLE LIBRE SENSOR SYSTEM MISC *A*
0 refills | Status: DC
Start: 2017-11-28 — End: 2017-12-16

## 2017-11-28 MED ORDER — LIRAGLUTIDE 18 MG/3ML SC SOPN *I*
PEN_INJECTOR | SUBCUTANEOUS | 0 refills | Status: DC
Start: 2017-11-28 — End: 2017-12-09

## 2017-11-28 MED ORDER — GLIPIZIDE 10 MG PO TABS *I*
10.0000 mg | ORAL_TABLET | Freq: Every day | ORAL | 0 refills | Status: DC
Start: 2017-11-28 — End: 2018-01-16

## 2017-11-28 NOTE — Telephone Encounter (Signed)
Order placed for [place] sensor and reader, to [employer].   Krystal Eaton, MD

## 2017-11-28 NOTE — Patient Instructions (Signed)
Start the injection once a day as directed on the prescription.    Drop the glipizide to just once a day with the biggest meal.    No change to the long-acting insulin dose.

## 2017-11-28 NOTE — Telephone Encounter (Signed)
Patient would like to get an order for the [place], not sure how to go about this.  He would like to get any testing equipment ordered thru [facility] in [place] Not sure how to go about getting this put in or how to go about this.  His wife checked with there insurance and states it is somewhat covered?

## 2017-11-28 NOTE — Telephone Encounter (Signed)
Please have him do B12 at earliest convenience.  Doesn't have to fast.  Order placed.

## 2017-11-28 NOTE — Progress Notes (Signed)
[facility]  Outpatient Visit Note       Reason For Visit:   Chief Complaint   Patient presents with    Follow-up     DM. Sensation issues right foot.        Subjective:      Noah Craig is a 51 y.o. year old man with a history of HTN, HLD, DM, presenting for follow up.  Issues discussed below.    Right medial foot discomfort.    Feels like a sock is rolling up under his foot  Dealing with plantar fasciitis as of roughly one year ago.  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      DM:  Denies hypoglycemia symptoms.  Remains on glipizide 10 mg BID, metformin 1000 mg BID, and lantus.  A1c elevated at 8.8.      HLD:  LDL level quite good at 47.  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  Has cut down on soda and limiting it to diet.      Home Medications:     Prior to Admission medications    Medication Sig Start Date End Date Taking? Authorizing Provider   ketotifen (ZADITOR) 0.025 % ophthalmic solution Place 1 drop into the right eye 2 times daily as needed for Allergies (Itching) 08/07/17  Yes Krystal Eaton, MD   glipiZIDE (GLUCOTROL) 10 MG tablet TAKE 1 TABLET BY MOUTH TWO TIMES DAILY BEFORE MEALS 07/01/17  Yes Bansal, Anuj, MD   metFORMIN (GLUCOPHAGE-XR) 500 MG 24 hr tablet TAKE 2 TABLETS BY MOUTH TWO TIMES DAILY ( AFTER MEALS ) SWALLOW WHOLE , DO NOT CRUSH , BREAK , OR CHEW. 07/01/17  Yes Bansal, Anuj, MD   ONE TOUCH ULTRA BLUE test strip TEST BLOOD SUGAR 2 TO 4 TIMES A DAY 02/04/17  Yes Bansal, Anuj, MD   B-D ULTRAFINE III SHORT PEN 31G X 8 MM USE ONE TIME DAILY AS DIRECTED 11/05/16  Yes Talmage Nap, MD   insulin glargine (LANTUS SOLOSTAR) 100 unit/mL injection pen Inject 35 Units into the skin nightly 09/18/16  Yes Bansal, Anuj, MD   lancets (ONETOUCH ULTRASOFT) USE FOUR TIMES DAILY AS DIRECTED FOR DIABETES 05/08/16  Yes Talmage Nap, MD    naproxen sodium (ANAPROX) 220 MG tablet Take 220 mg by mouth 2 times daily (with meals)   Pt takes as needed   Yes [provider]   blood glucose VI test strips (ONE TOUCH ULTRA TEST) USE ONE STRIP 4 TIMES DAILY DX: DM 250 08/23/11  Yes Talmage Nap, MD       Allergies:     Allergies   Allergen Reactions    No Known Drug Allergy      Created by Conversion - 0;     No Known Latex Allergy      Created by Conversion - 0;      Review of Systems:     As detailed above by problem.     Physical Exam:  Temp Readings from Last 3 Encounters:   08/07/17 36.6 C (97.9 F) (Temporal)   01/09/16 36.8 C (98.3 F) (Tympanic)   01/14/15 37.3 C (99.2 F) (Tympanic)     BP Readings from Last 3 Encounters:   11/28/17 132/84   08/07/17 (!) 140/92   05/03/17 130/80     Pulse Readings from Last 3 Encounters:  11/28/17 72   08/07/17 64   05/03/17 78         Vitals:    11/28/17 0906   BP: 132/84   BP Location: Left arm   Patient Position: Sitting   Cuff Size: large adult   Pulse: 72   Weight: 125.8 kg (277 lb 6.4 oz)   Height: 1.854 m (6\' 1" )     Wt Readings from Last 3 Encounters:   11/28/17 125.8 kg (277 lb 6.4 oz)   08/07/17 126.6 kg (279 lb)   05/03/17 127.5 kg (281 lb)     General: Well-developed male, in no acute distress.  Lungs: Breathing comfortably at rest.  Lungs CTAB without adventitious sounds.  Cardiovascular: RRR no m/r/g.  Extremities warm and dry to touch.   Diabetic foot exam: Readily palpable DP and PT pulses bilaterally.  Skin intact without ulceration. 10/10 monofilament sites tested intact bilaterally.  Positive for mildly decreased vibratory sense in the bilateral distal feet; preserved proximally.  No ulcerations.  No inflammatory changes in the joints of the right foot.    Neurologic: Fully interactive and cooperative with exam.  Able to provide an accurate history.  Speech and mood appropriate.      ASSESSMENT and Plan:     51 y.o. year old male with HTN, HLD, DM, presenting for follow up.  Plan by  problem below.    1. Type 2 diabetes mellitus   - Not controlled  - Expressed importance of dietary measures, weight loss to get back some insulin sensitivity.   - continue metformin 1,000 mg BId  - continue lantus at current dose  - decrease glipizide to 10 mg with biggest meal  - start victoza starting dose 0.6 --> 1.2 as written.  If tolerating well, then move up to 1.8 mg dosing.  Advised to monitor for throat swelling/lumps, GI intolerance.  Patient denies personal or family history of endocrine cancers.    2. Need for prophylactic vaccination and inoculation against influenza - given at visit.    3. Mixed hyperlipidemia - controlled with lifestyle only at this time. Continue to monitor, weight loss encouraged as above; limiting sugary foods.       Krystal Eaton, MD 11/28/2017 9:15 AM

## 2017-11-28 NOTE — Telephone Encounter (Signed)
Called [facility]  lab- only able to add TSH as vitamin B 12 requires a different collection tube.  Do you want patient to go for another draw?

## 2017-11-29 ENCOUNTER — Other Ambulatory Visit: Payer: Self-pay

## 2017-11-29 ENCOUNTER — Other Ambulatory Visit: Payer: Self-pay | Admitting: Primary Care

## 2017-11-29 DIAGNOSIS — E119 Type 2 diabetes mellitus without complications: Secondary | ICD-10-CM

## 2017-11-29 LAB — VITAMIN B12: Vitamin B12: 849 pg/mL (ref 232–1245)

## 2017-11-29 NOTE — Telephone Encounter (Signed)
Spoke with patient - reviewed message below.

## 2017-12-02 ENCOUNTER — Telehealth: Payer: Self-pay | Admitting: Primary Care

## 2017-12-02 DIAGNOSIS — Z1211 Encounter for screening for malignant neoplasm of colon: Secondary | ICD-10-CM

## 2017-12-02 DIAGNOSIS — Z1212 Encounter for screening for malignant neoplasm of rectum: Secondary | ICD-10-CM

## 2017-12-02 NOTE — Telephone Encounter (Signed)
Patient forgot to ask for a referral, goes to [facility] for his colonoscopy, needs to be done every 5 years, went 5 years ago.  Please place referral, gave him the number and he will call to get things going.  Had some issues in his last colonoscopy, 5 year recall

## 2017-12-02 NOTE — Telephone Encounter (Signed)
All set with referral, thank you.  Krystal Eaton, MD

## 2017-12-04 ENCOUNTER — Telehealth: Payer: Self-pay | Admitting: Primary Care

## 2017-12-04 NOTE — Telephone Encounter (Signed)
Patient has taken victoza, has symptoms of dehydration, has decreased bowel movements, pain in low back,drinking 6 bottles of water per day, not going as often as he should, very strong color in urine.  Has been taking since last Friday, please advise.  Has not taken today

## 2017-12-04 NOTE — Telephone Encounter (Signed)
Let patient know to hold the medication until early next week, give [place] an update at that time.  He will work to re-hydrate himself, If vomiting or fever or inability to eat/drink happens to seek med attention.  He agrees and will do.

## 2017-12-04 NOTE — Telephone Encounter (Signed)
Please hold the medication until early next week and give [place] an update at that time.  Agree with encouraging water intake.  Can liberalize salt in the diet for the next couple of days as well, that will help "rehydrate" him.  Needs evaluation for vomiting, fever, inability to eat/drink, or severe abdominal pain.

## 2017-12-06 ENCOUNTER — Telehealth: Payer: Self-pay

## 2017-12-06 NOTE — Telephone Encounter (Signed)
Mr. Noah Craig is scheduled for a colonoscopy on 04/30/18 with Dr. Arrie Eastern.  Please mail prep instructions to the address listed on file.   The patient can be reached if necessary at [phone] .    1.  Is the patient taking any prescribed blood thinners? no     2.  Is the patient on any type of Kidney Dialysis? yes    3.  Does the patient have any type of cardiac device or a defibrillator? no    4.  Is the patients weight greater than 350lbs? no    5. Does the patient have a current tracheotomy? no     6.  Does the patient use a machine to help them breathe at night? no    7.  Is the patient Diabetic? yes  insulin glargine (LANTUS SOLOSTAR) 100 unit/mL injection pen and metFORMIN (GLUCOPHAGE-XR) 500 MG 24 hr tablet    8.  Is the patient able to sign the consent form?  yes

## 2017-12-06 NOTE — Telephone Encounter (Signed)
Will mail prep closer to procedure

## 2017-12-09 ENCOUNTER — Telehealth: Payer: Self-pay | Admitting: Primary Care

## 2017-12-09 MED ORDER — EMPAGLIFLOZIN 10 MG PO TABS *I*
10.0000 mg | ORAL_TABLET | Freq: Every morning | ORAL | 2 refills | Status: DC
Start: 2017-12-09 — End: 2018-03-17

## 2017-12-09 NOTE — Telephone Encounter (Signed)
Patient calling regarding going off the victoza, he states his dehyrdration has gotten better, is still very constipated Also when he wakes up he is within normal ranges (160)  but when he takes his medication it doubles  Takes a few hours to get back in normal range.  He Is now on metformin, does glipizide 2 x per day and Lantus at night as usual.  He also wanted you to know there is no change in his blood sugar readings when he is referring (1 1/2 hrs of running around)  Is surprised as it finally does go down after he is sitting for a 1/2 hr.  He will need to know what to do.

## 2017-12-09 NOTE — Telephone Encounter (Signed)
Let patient know a new prescription for jardiance has been ordered, he can pick it up and start next week, let us know if he has any of the same symptoms.  As far as the constipation he was encouraged to drink plenty of water.  He will let us know how he does the rest of the week with his blood sugar numbers.

## 2017-12-09 NOTE — Telephone Encounter (Signed)
Discontinue the victoza altogether.   Next week, start jardiance 10 mg daily (Rx sent in).  Let us know if dehydration or abdominal pain worsen on the alternative medicine.  It works differently than the [place] does.      Krystal Eaton, MD

## 2017-12-13 NOTE — Telephone Encounter (Signed)
The constipation is gone, dehydration is back to normal  Numbers are yo-yoing like crazy wake up 1:40AM or 150 AM after he eats climbs to over 100  Test half and hour later still climbing, has no numbers for right now  Getting over 300, sometimes 320 or 330 top number everyday around lunch time for last 5 or 6 days  Goes into afternoon to 170  After eats supper climbs back up again 200's, 250, 260  Last couple days had flu like symptoms, upset stomach,fever,nauseous,body aches, heavy sinus headaches last few days,got flu shot 2 weeks ago, this happens every year after getting  flu shot  Wants to know if you still want him to stop taking Glipizide in morning? still takes in the afternoon  Can leave detailed instructions on the phone if patient doesn't answer

## 2017-12-13 NOTE — Telephone Encounter (Signed)
Please have him resume the glipizide at twice daily dosing (restart the morning dose).  Thank you for the update.  Let us know how he is doing later next week.  If still having issues with the BG needs an appointment.    Krystal Eaton, MD

## 2017-12-16 ENCOUNTER — Other Ambulatory Visit: Payer: Self-pay | Admitting: Primary Care

## 2017-12-16 DIAGNOSIS — E119 Type 2 diabetes mellitus without complications: Secondary | ICD-10-CM

## 2017-12-16 MED ORDER — FREESTYLE LIBRE SENSOR SYSTEM MISC *A*
1 refills | Status: DC
Start: 2017-12-16 — End: 2018-03-15

## 2017-12-16 NOTE — Telephone Encounter (Signed)
Last seen 11-28-17 and next apt 02-25-18.  Last HgbA1c 11/18 and ordered.

## 2017-12-16 NOTE — Telephone Encounter (Signed)
Ok great- thank you for the update!

## 2017-12-16 NOTE — Telephone Encounter (Signed)
Patient will add the glipizide 10mg   back this am, he also wanted you to know he started the Jardiance 10 mg this am

## 2017-12-18 ENCOUNTER — Ambulatory Visit: Payer: No Typology Code available for payment source | Attending: Primary Care | Admitting: Primary Care

## 2017-12-18 VITALS — BP 152/84 | HR 80 | Temp 97.3°F | Ht 73.0 in | Wt 275.4 lb

## 2017-12-18 DIAGNOSIS — J019 Acute sinusitis, unspecified: Secondary | ICD-10-CM

## 2017-12-18 MED ORDER — AMOXICILLIN-POT CLAVULANATE 875-125 MG PO TABS *I*
1.0000 | ORAL_TABLET | Freq: Two times a day (BID) | ORAL | 0 refills | Status: DC
Start: 2017-12-18 — End: 2018-02-25

## 2017-12-18 NOTE — Progress Notes (Signed)
[facility]  Outpatient Visit Note       Reason For Visit:   Chief Complaint   Patient presents with    URI       Subjective:      Noah Craig is a 51 y.o. year old man with a history of HTN, HLD, DM, presenting for URI symptoms.  Issues discussed below.    URI symptoms:  Sinus pressure, rhinorrhea with clear then turned into more tenacious phlegm, cough, more labored breathing.  Symptoms started over the course of a couple days.  Mucinex sinus with a little help; alka seltzer not helpful.  The most severe symptom is the headaches with this.  No help with flonase.      Home Medications:     Prior to Admission medications    Medication Sig Start Date End Date Taking? Authorizing Provider   continuous blood glucose monitor (FREESTYLE LIBRE) sensor Apply sensor to back of upper arm. Replace sensor every 10 days. 12/16/17  Yes Krystal Eaton, MD   empagliflozin (JARDIANCE) 10 mg tablet Take 1 tablet (10 mg total) by mouth every morning 12/09/17  Yes Krystal Eaton, MD   glipiZIDE (GLUCOTROL) 10 MG tablet Take 1 tablet (10 mg total) by mouth daily 11/28/17  Yes Krystal Eaton, MD   continuous blood glucose monitor (FREESTYLE LIBRE) reader Use with Freestyle Libre CGM sensor 11/28/17  Yes Krystal Eaton, MD   ketotifen (ZADITOR) 0.025 % ophthalmic solution Place 1 drop into the right eye 2 times daily as needed for Allergies (Itching) 08/07/17  Yes Krystal Eaton, MD   metFORMIN (GLUCOPHAGE-XR) 500 MG 24 hr tablet TAKE 2 TABLETS BY MOUTH TWO TIMES DAILY ( AFTER MEALS ) SWALLOW WHOLE , DO NOT CRUSH , BREAK , OR CHEW. 07/01/17  Yes Bansal, Anuj, MD   ONE TOUCH ULTRA BLUE test strip TEST BLOOD SUGAR 2 TO 4 TIMES A DAY 02/04/17  Yes Bansal, Anuj, MD   B-D ULTRAFINE III SHORT PEN 31G X 8 MM USE ONE TIME DAILY AS DIRECTED 11/05/16  Yes Talmage Nap, MD   insulin glargine (LANTUS SOLOSTAR) 100 unit/mL injection pen Inject 35 Units into the skin nightly 09/18/16  Yes Talmage Nap, MD   lancets  (ONETOUCH ULTRASOFT) USE FOUR TIMES DAILY AS DIRECTED FOR DIABETES 05/08/16  Yes Talmage Nap, MD   naproxen sodium (ANAPROX) 220 MG tablet Take 220 mg by mouth 2 times daily (with meals)   Pt takes as needed   Yes [provider]   blood glucose VI test strips (ONE TOUCH ULTRA TEST) USE ONE STRIP 4 TIMES DAILY DX: DM 250 08/23/11  Yes Talmage Nap, MD       Allergies:     Allergies   Allergen Reactions    Victoza [Liraglutide] Other (See Comments)     GI upset    No Known Latex Allergy      Created by Conversion - 0;      Review of Systems:     As detailed above by problem.     Physical Exam:  Temp Readings from Last 3 Encounters:   12/18/17 36.3 C (97.3 F) (Temporal)   08/07/17 36.6 C (97.9 F) (Temporal)   01/09/16 36.8 C (98.3 F) (Tympanic)     BP Readings from Last 3 Encounters:   12/18/17 152/84   11/28/17 132/84   08/07/17 (!) 140/92     Pulse Readings from Last 3 Encounters:   12/18/17 80   11/28/17 72   08/07/17  64         Vitals:    12/18/17 1133   BP: 152/84   BP Location: Right arm   Patient Position: Sitting   Cuff Size: large adult   Pulse: 80   Temp: 36.3 C (97.3 F)   TempSrc: Temporal   SpO2: 97%   Weight: 124.9 kg (275 lb 6.4 oz)   Height: 1.854 m (6\' 1" )     Wt Readings from Last 3 Encounters:   12/18/17 124.9 kg (275 lb 6.4 oz)   11/28/17 125.8 kg (277 lb 6.4 oz)   08/07/17 126.6 kg (279 lb)     General: Well-developed male, in no acute distress.  HEENT: Sclerae anicteric.  TMs clear bilaterally. Oropharynx moist, pink, without exudate.  No adenopathy in the neck.  Bilateral nasal passage rhinorrhea, erythema, swelling, uncomfortable with pressure over the maxillary and frontal areas.    Lungs: Breathing comfortably at rest.  Lungs CTAB without adventitious sounds.  Cardiovascular: RRR no m/r/g.  Extremities warm and dry to touch.   Neurologic: Fully interactive and cooperative with exam.  Able to provide an accurate history.  Speech and mood appropriate.      ASSESSMENT and Plan:      51 y.o. year old male with HTN, HLD, DM, presenting for URI symptoms..  Plan by problem below.    1. Acute sinusitis   - Based on duration of symptoms > 1 month and inflammation on exam of the nares, will treat with course of augmentin x 7 days.  Follow up if no improvement.  Can use sinus rinses +/- flonase if he is interested in that as well, however he didn't get much benefit with the flonase when he used it the first time.  Encouraged hydration and rest as well.         Krystal Eaton, MD 12/18/2017 11:36 AM

## 2017-12-30 ENCOUNTER — Other Ambulatory Visit: Payer: Self-pay | Admitting: Primary Care

## 2018-01-01 NOTE — Telephone Encounter (Signed)
Last seen 12-18-17 and next apt 02-25-18.  CMP and HgbA1c 11/18.  HgbA1c ordered.

## 2018-01-16 ENCOUNTER — Other Ambulatory Visit: Payer: Self-pay | Admitting: Primary Care

## 2018-01-16 NOTE — Telephone Encounter (Signed)
Last visit 12/18/17  Next appt scheduled 02/25/18  Labs done 11/26/17

## 2018-01-17 LAB — HM DIABETES FOOT EXAM

## 2018-01-17 LAB — HM DIABETES EYE EXAM

## 2018-01-29 ENCOUNTER — Ambulatory Visit: Payer: No Typology Code available for payment source | Admitting: Orthopedic Surgery

## 2018-01-29 ENCOUNTER — Telehealth: Payer: Self-pay | Admitting: Primary Care

## 2018-01-29 ENCOUNTER — Encounter: Payer: Self-pay | Admitting: Orthopedic Surgery

## 2018-01-29 ENCOUNTER — Ambulatory Visit
Admission: RE | Admit: 2018-01-29 | Discharge: 2018-01-29 | Disposition: A | Discharge status: Home / Self Care | Payer: No Typology Code available for payment source | Source: Ambulatory Visit | Attending: Radiology | Admitting: Radiology

## 2018-01-29 ENCOUNTER — Encounter: Payer: No Typology Code available for payment source | Admitting: Orthopedic Surgery

## 2018-01-29 VITALS — BP 183/84 | Ht 73.0 in | Wt 270.0 lb

## 2018-01-29 DIAGNOSIS — M7731 Calcaneal spur, right foot: Secondary | ICD-10-CM

## 2018-01-29 DIAGNOSIS — M79673 Pain in unspecified foot: Secondary | ICD-10-CM

## 2018-01-29 DIAGNOSIS — S86011A Strain of right Achilles tendon, initial encounter: Secondary | ICD-10-CM | POA: Insufficient documentation

## 2018-01-29 NOTE — Telephone Encounter (Signed)
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  The trainer felt it was from an Achilles issue, today he heard a pop when he put weight on it.  He is in a lot of pain Wondering if he can get a referral to Urgent Ortho?  Would like to be seen asap

## 2018-01-29 NOTE — Telephone Encounter (Signed)
This is the right foot of this patient, he will be going to UC at [facility] today at [employer]

## 2018-01-29 NOTE — Progress Notes (Signed)
CHIEF COMPLAINT: Right ankle injury    HISTORY OF PRESENT ILLNESS: Patient referred to urgent care tonight for an injury to his right ankle, he was playing basketball and pushing off with the right leg when he felt a pop in the back of the ankle followed by pain and inability to bear weight.  No prior history of ankle or foot problems.  He notes persistent pain at the back of the heel.    PAST MEDICAL HISTORY: Diabetes, see chart listing    PAST SURGICAL HISTORY: Left total knee replacement, right total knee replacement, multiple knee arthroscopies    MEDICATIONS: See chart list    ALLERGIES: See chart list    PERSONAL HISTORY: Married, works as a Administrator    SOCIAL HISTORY: Does not smoke, consumes alcohol in moderation    FAMILY HISTORY: Negative    REVIEW OF SYSTEMS:  Review of Gastointestinal, Genitourinary, Neurologic, Integument, Vascular, Hematologic, Lymphatic, Cardiac, Pulmonary and Endocrine systems reveal the following: Negative for pertinent positives    PHYSICAL EXAM: Clinical exam of the right ankle demonstrates marked tenderness at the Achilles insertion.  Negative [place] test, equivocal tabletop test.  No palpable defects along the tendo Achilles.  No pain over the medial or lateral malleolus.  No pain in the subtalar region.  Palpation the midfoot and forefoot were pain-free.    IMAGING: Radiographs of the right calcaneus show no obvious acute bony abnormalities.  Calcification the Achilles insertion is noted    ASSESSMENT AND DIAGNOSIS: Clinical exam worrisome for insertional partial tear of the Achilles    PLAN: Patient was placed in a high tide fracture boot with a heel lift, he will restrict weightbearing with crutches.  MRI imaging has been ordered of the right ankle.  Follow-up to review when available.  Icing, elevation, ibuprofen and/or Tylenol as needed for pain and swelling in the meantime.

## 2018-01-29 NOTE — Telephone Encounter (Signed)
Based on his description I'm concerned about an achilles tendon tear or rupture.  I've put in the referral to orthopedics for further evaluation.    Krystal Eaton, MD

## 2018-01-30 ENCOUNTER — Encounter: Payer: Self-pay | Admitting: Orthopedic Surgery

## 2018-02-04 ENCOUNTER — Ambulatory Visit
Admission: RE | Admit: 2018-02-04 | Discharge: 2018-02-04 | Disposition: A | Discharge status: Home / Self Care | Payer: No Typology Code available for payment source | Source: Ambulatory Visit | Attending: Radiology | Admitting: Radiology

## 2018-02-04 DIAGNOSIS — R600 Localized edema: Secondary | ICD-10-CM | POA: Insufficient documentation

## 2018-02-04 DIAGNOSIS — S86011A Strain of right Achilles tendon, initial encounter: Secondary | ICD-10-CM

## 2018-02-04 DIAGNOSIS — S86311A Strain of muscle(s) and tendon(s) of peroneal muscle group at lower leg level, right leg, initial encounter: Secondary | ICD-10-CM | POA: Insufficient documentation

## 2018-02-06 ENCOUNTER — Ambulatory Visit
Payer: No Typology Code available for payment source | Attending: Orthopedic Surgery | Admitting: Orthotics/Prosthetics/Pedorthics

## 2018-02-06 ENCOUNTER — Ambulatory Visit: Payer: No Typology Code available for payment source | Attending: Orthopedic Surgery | Admitting: Orthopedic Surgery

## 2018-02-06 ENCOUNTER — Other Ambulatory Visit: Payer: No Typology Code available for payment source

## 2018-02-06 ENCOUNTER — Encounter: Payer: Self-pay | Admitting: Orthopedic Surgery

## 2018-02-06 VITALS — BP 178/79 | HR 78 | Ht 73.0 in | Wt 270.0 lb

## 2018-02-06 DIAGNOSIS — S86011A Strain of right Achilles tendon, initial encounter: Secondary | ICD-10-CM

## 2018-02-06 DIAGNOSIS — S86011D Strain of right Achilles tendon, subsequent encounter: Secondary | ICD-10-CM | POA: Insufficient documentation

## 2018-02-06 NOTE — Patient Instructions (Signed)
The Patient was provided with the following items:  Device  Side: Right  Size:: MD  Model:: ASO  Manufacturer: Procare  Part Number: [id number]  Warranty:: 90 Days  Quantity: 1  Status: Delivered  [facility]      Your physician has determined that you require Prefabricated (off-the-shelf) Orthotic and Prosthetic (O&P) devices and/or Durable Medical Equipment (DME).  O&P devices include items such as braces for the spine or limbs, while DME includes items such as canes, crutches and walkers.  For your convenience you were fitted by the [facility].    Return Policy:    • Prefabricated O&P and DME items are not returnable if used outside of clinic due to hygiene concerns.    • Special or custom orders are not returnable or refundable.    • O&P devices and DME purchased from the [facility] may only be exchanged if the item is faulty or poorly fitting, as determined by the O&P Department Clinical Coordinator.  Exchanges will be made using the same type of device, if within 7 days of the purchase date.    • No exchange will be issued for items that were not used in accordance with the manufacturer’s recommended standard of care.    • Replacement or repair of minor parts could become necessary due to wear.  This may include a charge and an order from you physician may be required.    Patient Instructions for using ASO ankle brace    Purpose of device   You have been provided with an ASO ankle brace as prescribed by your physician.  Your ASO will provide medial/lateral support to your ankle.      Wear Instructions  • Brace should be worn with a clean sock at all times.    Donning Your AIRCAST  • Loosen straps and laces, slide foot into brace.   • With your ankle set at 90 degrees, tighten laces to comfort.  • Wrap “A” and “B” straps over forefoot then under arch   • Brace will fit into shoe wear that adjustable.  Ideal shoe wear is to wear  with your brace is similar to athletic   • Laces should be tightened to comfort.  • Wrap circumferential strap around ankle over straps “A” and “B” to secure itself    Cleaning and Care    • Hand wash with mild soap in lukewarm water.    • Rinse thoroughly and air dry.    Frequency / Duration of use   • Your physician will determine the overall length of time you will need to use your ASO   • Brace is designed to be worn daily     Potential Risks / Benefits   Contact your Orthotist is if you experience numbness/tingling or increased swelling in your foot    How to report potential failure/malfunction   If you have any concerns or questions regarding the fit of your brace please contact [facility] at [phone].

## 2018-02-06 NOTE — Progress Notes (Signed)
Walk-in Visit  [facility]  [phone]    Patient name: Brownie Nehme   MRN: [id number]      ICD-10-CM ICD-9-CM   1. Strain of right Achilles tendon, subsequent encounter S86.011D V58.89     845.09       Pain    02/06/18 1521   PainSc:   1       The Patient was provided with the following items:  Device  Side: Right  Size:: MD  Model:: ASO  Manufacturer: Procare  Part Number: [id number]  Warranty:: 90 Days  Quantity: 1  Status: Delivered    Functional Goals: Provide joint stability    ROM settings: N/A    Expected Frequency / Duration of Use:   Daily    Modifications made: No modifications were required at this time    Complexity:   Fitting was routine, requiring little to no adjustments    Yes, Device was inspected for safety/security and found to be functioning properly    Ordered/Delivered: Device Delivered    The patient given verbal and written instructions.  The following instructions were reviewed: Purpose of device  Cleaning / Care of device  Potential Risks / Benefits  Frequency / Duration of use  How to report potential failure / malfunctions      Leana Gamer

## 2018-02-06 NOTE — Progress Notes (Signed)
CHIEF COMPLAINT: Right heel injury    INTERVAL HISTORY: Patient presents for follow-up today for his right heel, he is wearing normal shoe wear.  He notes some persistent discomfort at the base that he'll otherwise is ambulating with minimal difficulty.  He notes going down stairs seems to be the most uncomfortable.    PFSH:  Since last visit no change.    ROS:  Since last visit no change.    MEDICATION:  Since last visit no new meds.    ALLERGIES:  As per initial evaluation.    PHYSICAL EXAM: Clinical exam today of the right heel demonstrates again no palpable defects in the tendo Achilles, some tenderness to the insertion of the Achilles tendon.  Negative [place] test, no pain over the retrocalcaneal bursa or with compression of the body of the calcaneus.  No pain with heel strike.    IMAGING: MRI of the right ankle shows some nonspecific edema at the distal insertion of the Achilles, no obvious tear.  No evidence of fracture.  This is a preliminary read.    ASSESSMENT: Right Achilles tendon strain resolving uneventfully    PLAN: Patient was converted to an ASO ankle brace.  He understands the importance of stretching and icing.  He was given a release back to work unrestricted.  As he has noted a marked improvement follow-up on a when necessary basis only.  He will call if he has any increasing or ongoing problems.

## 2018-02-21 ENCOUNTER — Encounter: Payer: Self-pay | Admitting: Primary Care

## 2018-02-24 ENCOUNTER — Other Ambulatory Visit
Admission: RE | Admit: 2018-02-24 | Discharge: 2018-02-24 | Disposition: A | Discharge status: Home / Self Care | Payer: No Typology Code available for payment source | Source: Ambulatory Visit | Attending: Primary Care | Admitting: Primary Care

## 2018-02-24 DIAGNOSIS — E119 Type 2 diabetes mellitus without complications: Secondary | ICD-10-CM | POA: Insufficient documentation

## 2018-02-24 LAB — MICROALBUMIN, URINE, RANDOM
Creatinine,UR: 217 mg/dL (ref 20–300)
Microalb/Creat Ratio: 43 mg MA/g CR — ABNORMAL HIGH (ref 0.0–29.9)
Microalbumin,UR: 9.33 mg/dL

## 2018-02-24 LAB — HEMOGLOBIN A1C: Hemoglobin A1C: 6.4 % — ABNORMAL HIGH

## 2018-02-25 ENCOUNTER — Ambulatory Visit: Payer: No Typology Code available for payment source | Attending: Primary Care | Admitting: Primary Care

## 2018-02-25 VITALS — BP 150/80 | HR 72 | Ht 73.0 in | Wt 279.0 lb

## 2018-02-25 DIAGNOSIS — E119 Type 2 diabetes mellitus without complications: Secondary | ICD-10-CM

## 2018-02-25 DIAGNOSIS — I10 Essential (primary) hypertension: Secondary | ICD-10-CM

## 2018-02-25 MED ORDER — LISINOPRIL 10 MG PO TABS *I*
10.0000 mg | ORAL_TABLET | Freq: Every day | ORAL | 5 refills | Status: DC
Start: 2018-02-25 — End: 2018-03-17

## 2018-02-25 NOTE — Progress Notes (Signed)
[facility]  Outpatient Visit Note       Reason For Visit:   Chief Complaint   Patient presents with    Follow-up     DM. Concerned about elevated BP readings.        Subjective:      Noah Craig is a 52 y.o. year old man with HTN, HLD, DM, presenting for follow up.  Issues discussed below.    DM:  Using his [place] reader and liking it a lot compared to his previous method of testing.  Insulin is down to 10-15 units per night.  He finds himself more accurate at the range when his BGs are under tighter control.  Microalbumin/creatinine ratio was slightly elevated at 43.  A1c improved significantly from 8.8 to 6.4.      Elevated blood pressure:  150 SBP at office visit here.  He recalls it being 140 / 80 mmHg at a different medical office.  No CP, headache, or SOB.  He is not currently on BP medication specifically.      Home Medications:     Prior to Admission medications    Medication Sig Start Date End Date Taking? Authorizing Provider   metFORMIN (GLUCOPHAGE-XR) 500 MG 24 hr tablet TAKE 2 TABLETS BY MOUTH TWO TIMES DAILY ( AFTER MEALS ) SWALLOW WHOLE , DO NOT CRUSH , BREAK , OR CHEW. 01/16/18   Krystal Eaton, MD   glipiZIDE (GLUCOTROL) 10 MG tablet TAKE 1 TABLET BY MOUTH TWO TIMES DAILY BEFORE MEALS 01/16/18   Krystal Eaton, MD   B-D ULTRAFINE III SHORT PEN 31G X 8 MM USE ONCE DAILY AS DIRECTED 01/01/18   Krystal Eaton, MD   amoxicillin-clavulanate (AUGMENTIN) 875-125 MG per tablet Take 1 tablet by mouth 2 times daily 12/18/17   Krystal Eaton, MD   continuous blood glucose monitor (FREESTYLE LIBRE) sensor Apply sensor to back of upper arm. Replace sensor every 10 days. 12/16/17   Krystal Eaton, MD   empagliflozin (JARDIANCE) 10 mg tablet Take 1 tablet (10 mg total) by mouth every morning 12/09/17   Krystal Eaton, MD   continuous blood glucose monitor (FREESTYLE LIBRE) reader Use with Freestyle Libre CGM sensor 11/28/17   Krystal Eaton, MD   ketotifen (ZADITOR) 0.025 %  ophthalmic solution Place 1 drop into the right eye 2 times daily as needed for Allergies (Itching) 08/07/17   Krystal Eaton, MD   ONE TOUCH ULTRA BLUE test strip TEST BLOOD SUGAR 2 TO 4 TIMES A DAY 02/04/17   Talmage Nap, MD   insulin glargine (LANTUS SOLOSTAR) 100 unit/mL injection pen Inject 35 Units into the skin nightly 09/18/16   Talmage Nap, MD   lancets (ONETOUCH ULTRASOFT) USE FOUR TIMES DAILY AS DIRECTED FOR DIABETES 05/08/16   Talmage Nap, MD   naproxen sodium (ANAPROX) 220 MG tablet Take 220 mg by mouth 2 times daily (with meals)   Pt takes as needed    [provider]   blood glucose VI test strips (ONE TOUCH ULTRA TEST) USE ONE STRIP 4 TIMES DAILY DX: DM 250 08/23/11   Talmage Nap, MD       Allergies:     Allergies   Allergen Reactions    Victoza [Liraglutide] Other (See Comments)     GI upset    No Known Latex Allergy      Created by Conversion - 0;      Review of Systems:     As detailed above by problem.  Physical Exam:  Temp Readings from Last 3 Encounters:   12/18/17 36.3 C (97.3 F) (Temporal)   08/07/17 36.6 C (97.9 F) (Temporal)   01/09/16 36.8 C (98.3 F) (Tympanic)     BP Readings from Last 3 Encounters:   02/25/18 150/80   02/06/18 178/79   01/29/18 (!) 183/84     Pulse Readings from Last 3 Encounters:   02/06/18 78   12/18/17 80   11/28/17 72         Vitals:    02/25/18 1427   BP: 150/80   BP Location: Right arm   Patient Position: Sitting   Cuff Size: large adult   Pulse: 72   Weight: 126.6 kg (279 lb)   Height: 1.854 m (6\' 1" )     Wt Readings from Last 3 Encounters:   02/06/18 122.5 kg (270 lb)   01/29/18 122.5 kg (270 lb)   12/18/17 124.9 kg (275 lb 6.4 oz)     General: Well-developed male, in no acute distress.  Lungs: Breathing comfortably at rest.  Lungs CTAB without adventitious sounds.  Cardiovascular: RRR no m/r/g.  Extremities warm and dry to touch.   Neurologic: Fully interactive and cooperative with exam.  Able to provide an accurate history.  Speech and mood  appropriate.      ASSESSMENT and Plan:     53 y.o. year old male with HTN, HLD, DM, presenting for follow up.  Plan by problem below.      1. Essential hypertension - with multiple readings > 130/80 this constitutes HTN.  Not controlled.  Will start low-dose lisinopril in light of his diabetes.  Start lisinopril 10 mg daily.  Repeat BMP, microalbumin testing for early March.  Follow up 3 months.   2. Type 2 diabetes mellitus   - Controlled - congratulated and encouraged him with ongoing BG measurements to help himself regular his intake and activities a little better.    - Continue metformin 1000 mg BID  - Continue jardiance 10 mg daily, glipizide 10 mg BID.  - Continue lantus at 10-15 units nightly and plan to decrease by 2 units every two weeks as long as his AM BGs remain under good control.    - Starting lisinopril as above.     Krystal Eaton, MD 02/25/2018 2:24 PM

## 2018-02-25 NOTE — Telephone Encounter (Signed)
Noah Craig is calling to cancel his COB which is currently scheduled for 04/30/18 with Dr. Arrie Eastern.    Has the appointment been cancelled? yes    Has the appointment been rescheduled? no    Patient does not need to reschedule

## 2018-03-10 ENCOUNTER — Telehealth: Payer: Self-pay | Admitting: Primary Care

## 2018-03-10 NOTE — Telephone Encounter (Signed)
Patient is having coughing, headache, nausea goes away if he eats.  Started on lisinopril, stop taking on sat and Sunday, is wondering how soon the symptoms will go away, the cough is bad and gets worse, Is in [place] on way to [place] for a week.  Would like to know what to do?  Will be back Monday afternoon.  If you want him to go to Urgent care today he can stay in [place], needs to know by 11 so he can stay in the hotel if needed for another night.  Isn't sure if it's a reaction or if he is sick?

## 2018-03-10 NOTE — Telephone Encounter (Signed)
Gave message below to patient, he will do as instructed:     I would hold the lisinopril for now.  If the cough is from the lisinopril it should go away after a few days after being off of it.  Nausea and headache are less likely side effects, so it could be something else altogether (like a viral infection).  Either way, hold the lisinopril until he is feeling better.  As long as he isn't having lip swelling, shortness of breath, wheezing, hives, then ok not to go to UC, but keep an eye out for those symptoms and seek immediate care if they develop.

## 2018-03-10 NOTE — Telephone Encounter (Signed)
I would hold the lisinopril for now.  If the cough is from the lisinopril it should go away after a few days after being off of it.  Nausea and headache are less likely side effects, so it could be something else altogether (like a viral infection).  Either way, hold the lisinopril until he is feeling better.  As long as he isn't having lip swelling, shortness of breath, wheezing, hives, then ok not to go to UC, but keep an eye out for those symptoms and seek immediate care if they develop.

## 2018-03-15 ENCOUNTER — Other Ambulatory Visit: Payer: Self-pay | Admitting: Primary Care

## 2018-03-15 ENCOUNTER — Encounter: Payer: Self-pay | Admitting: Gastroenterology

## 2018-03-15 ENCOUNTER — Telehealth: Payer: Self-pay | Admitting: Primary Care

## 2018-03-15 ENCOUNTER — Other Ambulatory Visit: Payer: Self-pay

## 2018-03-15 ENCOUNTER — Emergency Department (HOSPITAL_COMMUNITY)
Admission: EM | Admit: 2018-03-15 | Discharge: 2018-03-15 | Disposition: A | Discharge status: Home / Self Care | Payer: BLUE CROSS/BLUE SHIELD | Attending: Emergency Medicine | Admitting: Emergency Medicine

## 2018-03-15 ENCOUNTER — Encounter (HOSPITAL_COMMUNITY): Payer: Self-pay

## 2018-03-15 ENCOUNTER — Emergency Department (HOSPITAL_COMMUNITY): Payer: BLUE CROSS/BLUE SHIELD

## 2018-03-15 ENCOUNTER — Ambulatory Visit (HOSPITAL_COMMUNITY)
Admission: EM | Admit: 2018-03-15 | Discharge: 2018-03-15 | Disposition: A | Discharge status: Home / Self Care | Payer: BLUE CROSS/BLUE SHIELD | Source: Home / Self Care | Attending: Family Medicine | Admitting: Family Medicine

## 2018-03-15 ENCOUNTER — Encounter (HOSPITAL_COMMUNITY): Payer: Self-pay | Admitting: *Deleted

## 2018-03-15 DIAGNOSIS — Z79899 Other long term (current) drug therapy: Secondary | ICD-10-CM | POA: Insufficient documentation

## 2018-03-15 DIAGNOSIS — R079 Chest pain, unspecified: Secondary | ICD-10-CM

## 2018-03-15 DIAGNOSIS — Z7984 Long term (current) use of oral hypoglycemic drugs: Secondary | ICD-10-CM | POA: Insufficient documentation

## 2018-03-15 DIAGNOSIS — Z87891 Personal history of nicotine dependence: Secondary | ICD-10-CM | POA: Insufficient documentation

## 2018-03-15 DIAGNOSIS — I10 Essential (primary) hypertension: Secondary | ICD-10-CM | POA: Diagnosis not present

## 2018-03-15 DIAGNOSIS — E119 Type 2 diabetes mellitus without complications: Secondary | ICD-10-CM | POA: Diagnosis not present

## 2018-03-15 DIAGNOSIS — R0602 Shortness of breath: Secondary | ICD-10-CM

## 2018-03-15 HISTORY — DX: Type 2 diabetes mellitus without complications: E11.9

## 2018-03-15 HISTORY — DX: Essential (primary) hypertension: I10

## 2018-03-15 LAB — CBC
HCT: 50.2 % (ref 39.0–52.0)
Hemoglobin: 18.5 g/dL — ABNORMAL HIGH (ref 13.0–17.0)
MCH: 31.1 pg (ref 26.0–34.0)
MCHC: 36.9 g/dL — ABNORMAL HIGH (ref 30.0–36.0)
MCV: 84.4 fL (ref 78.0–100.0)
Platelets: 193 10*3/uL (ref 150–400)
RBC: 5.95 MIL/uL — ABNORMAL HIGH (ref 4.22–5.81)
RDW: 13.1 % (ref 11.5–15.5)
WBC: 9.8 10*3/uL (ref 4.0–10.5)

## 2018-03-15 LAB — BASIC METABOLIC PANEL
Anion gap: 14 (ref 5–15)
BUN: 24 mg/dL — ABNORMAL HIGH (ref 6–20)
CO2: 20 mmol/L — ABNORMAL LOW (ref 22–32)
Calcium: 9.9 mg/dL (ref 8.9–10.3)
Chloride: 102 mmol/L (ref 101–111)
Creatinine, Ser: 1.13 mg/dL (ref 0.61–1.24)
GFR calc Af Amer: >60 mL/min (ref >60)
GFR calc non Af Amer: >60 mL/min (ref >60)
Glucose, Bld: 133 mg/dL — ABNORMAL HIGH (ref 65–99)
Potassium: 3.6 mmol/L (ref 3.5–5.1)
Sodium: 136 mmol/L (ref 135–145)

## 2018-03-15 LAB — I-STAT TROPONIN, ED
TROPONIN I, POC: 0 ng/mL (ref 0.00–0.08)
Troponin i, poc: 0 ng/mL (ref 0.00–0.08)

## 2018-03-15 LAB — D-DIMER, QUANTITATIVE (NOT AT ARMC)

## 2018-03-15 MED ORDER — ASPIRIN 81 MG PO CHEW
324.0000 mg | CHEWABLE_TABLET | Freq: Once | ORAL | Status: AC
  Administered 2018-03-15: 324 mg via ORAL
  Filled 2018-03-15: qty 4

## 2018-03-15 NOTE — ED Provider Notes (Addendum)
[facility] CARE CENTER   [id number] 03/15/18 Arrival Time: 1310  ASSESSMENT & PLAN:  1. Chest pain, unspecified type   2. SOB (shortness of breath)    Given the abrupt onset of his symptoms and PMH we are taking him to the ED for further evaluation. No signs of STEMI on ECG done here. Reviewed expectations re: course of current medical issues. Questions answered. Outlined signs and symptoms indicating need for more acute intervention. Patient verbalized understanding. After Visit Summary given.   SUBJECTIVE:  Nathaniel Hunter is a 52 y.o. male with a PMH of diabetes who presents with complaint of:  Chest Discomfort and SOB: Onset abrupt, approximately 1-1.5 hours ago while walking through grocery store, symptoms with slight improvement since arriving here. Questions increased heart rate at onset. No feeling now. Describes discomfort of L anterior chest and side as fairly persistent and dull in nature; does not radiate. Associated SOB worse at onset but slightly improved now; but still present. Rates discomfort now as a 2/10 in intensity. At onset was 4/10. No n/v/diaphoresis. Aggravating factors: none reported. Alleviating factors: none reported.  Cardiac risk factors: diabetes mellitus, hypertension and male gender. Previous cardiac testing: none reported.  Recently started on HCTZ to manage newly diagnosed HTN.  Social History   Tobacco Use  Smoking Status Former Smoker  Smokeless Tobacco Never Used  Tobacco Comment   quit 30 yrs ago   OTC treatment: none.  ROS: As per HPI. All other systems negative.   OBJECTIVE:  Vitals:   03/15/18 1325  BP: (!) 152/97  Pulse: 85  Resp: 18  Temp: (!) 97.4 F (36.3 C)  SpO2: 99%    General appearance: alert; no distress HENT: normocephalic; atraumatic Neck: supple Lungs: unlabored respirations; clear to auscultation bilaterally Heart: regular rate and rhythm Chest Wall: non-tender Abdomen: soft, non-tender; bowel sounds  normal; no masses or organomegaly; no guarding or rebound tenderness Extremities: no edema; symmetrical with no gross deformities Skin: warm and dry Psychological: alert and cooperative; normal mood and affect  ECG: Orders placed or performed in visit on 03/15/18  . EKG 12-Lead    Allergies  Allergen Reactions  . Victoza [Liraglutide]     Past Medical History:  Diagnosis Date  . Diabetes mellitus without complication (HCC)   . Hypertension    Social History   Socioeconomic History  . Marital status: Married    Spouse name: Not on file  . Number of children: Not on file  . Years of education: Not on file  . Highest education level: Not on file  Social Needs  . Financial resource strain: Not on file  . Food insecurity - worry: Not on file  . Food insecurity - inability: Not on file  . Transportation needs - medical: Not on file  . Transportation needs - non-medical: Not on file  Occupational History  . Not on file  Tobacco Use  . Smoking status: Former Games developer  . Smokeless tobacco: Never Used  . Tobacco comment: quit 30 yrs ago  Substance and Sexual Activity  . Alcohol use: Yes    Alcohol/week: 0.6 oz    Types: 1 Standard drinks or equivalent per week  . Drug use: Not on file  . Sexual activity: Not on file  Other Topics Concern  . Not on file  Social History Narrative  . Not on file   No FH or cardiac dz reported.  Past Surgical History:  Procedure Laterality Date  . JOINT REPLACEMENT    .  KNEE SURGERY       Mardella LaymanHagler, Cal Gindlesperger, MD 03/15/18 1401    Mardella LaymanHagler, Juandedios Dudash, MD 03/15/18 [phone]

## 2018-03-15 NOTE — Discharge Instructions (Signed)
You will need to follow-up with your primary Doctor in regards to today's visit.  You will need to return to the nearest emergency department if you experience any continued chest pain, shortness of breath, if chest pain becomes exertional, associated with sweating or nausea, radiates to left jaw/arm, worsens or becomes concerning in any way.

## 2018-03-15 NOTE — ED Provider Notes (Signed)
[facility] EMERGENCY [facility] Provider Note   CSN: [id number] Arrival date & time: 03/15/18  1400     History   Chief Complaint Chief Complaint  Patient presents with  . Chest Pain    HPI Nathaniel Hunter is a 52 y.o. male.  HPI   Patient is a 52 year old male with a history of diabetes, hypertension who presents the ED today complaining of left-sided chest pain that began suddenly around 12:30 PM today while he was walking in the store.  States pain was initially 3/10.  Describes it as a dull pain that is nonradiating.  Patient then sat down and rested, pain worsened to 5/10.  Reports associated palpitations, dyspnea on exertion, diaphoresis and states left arm "felt weird ", but was not painful or numb.  Denies nausea, lightheadedness, dizziness, vision changes. Patient was seen in urgent care prior to arrival and had ECG completed which showed no ischemic changes.  Of note, patient states he was started on lisinopril 10 days ago.  States he developed a cough after starting this medication and stopped it on 3/9. States cough improved somewhat after stopping this, however but is still present.  Describes as dry cough.  States after he was later seen at urgent care on 3/12 in [place] and was started on 25 mg hydrochlorothiazide at that time. Since then has increased water intake and is drinking 5 bottles of water daily. Also since then has had dyspnea on exertion. No edema to BLE.  Has had several car rides >4 hours over the last 2 weeks. [place] then [place] [facility]. Denies leg pain/swelling, hemoptysis, recent surgery/trauma, hormone use, personal hx of cancer, or hx of DVT/PE.   Past Medical History:  Diagnosis Date  . Diabetes mellitus without complication (HCC)   . Hypertension     There are no active problems to display for this patient.   Past Surgical History:  Procedure Laterality Date  . JOINT REPLACEMENT    . KNEE  SURGERY         Home Medications    Prior to Admission medications   Medication Sig Start Date End Date Taking? Authorizing Provider  empagliflozin (JARDIANCE) 10 MG TABS tablet Take 10 mg by mouth daily.   Yes [provider]  glipiZIDE (GLUCOTROL) 10 MG tablet Take 10 mg by mouth 2 (two) times daily before a meal.   Yes [provider]  hydrochlorothiazide (HYDRODIURIL) 25 MG tablet Take 25 mg by mouth daily.   Yes [provider]  Insulin Glargine (LANTUS) 100 UNIT/ML Solostar Pen Inject 10 Units into the skin at bedtime as needed (for a BGL >140).  09/18/16  Yes [provider]  metFORMIN (GLUCOPHAGE-XR) 500 MG 24 hr tablet Take 1,000 mg by mouth 2 (two) times daily. 03/14/18  Yes [provider]  naproxen sodium (ALEVE) 220 MG tablet Take 220-440 mg by mouth See admin instructions. Take 440 mg by mouth in the morning and may also take a second daily dose of 220-440 mg if needed for pain    Yes [provider]  Naproxen Sodium (ALEVE PO) Take by mouth.    [provider]    Family History No family history on file.  Social History Social History   Tobacco Use  . Smoking status: Former Games developermoker  . Smokeless tobacco: Never Used  . Tobacco comment: quit 30 yrs ago  Substance Use Topics  . Alcohol use: Yes  Alcohol/week: 0.6 oz    Types: 1 Standard drinks or equivalent per week  . Drug use: Not on file     Allergies   Victoza [liraglutide]   Review of Systems Review of Systems  Constitutional: Positive for diaphoresis. Negative for fever.  HENT: Negative for congestion, ear pain, rhinorrhea and sore throat.   Eyes: Negative for pain and visual disturbance.  Respiratory: Positive for cough and shortness of breath. Negative for wheezing.   Cardiovascular: Positive for chest pain and palpitations. Negative for leg swelling.  Gastrointestinal: Negative for abdominal pain, constipation, diarrhea, nausea and  vomiting.  Genitourinary: Negative for decreased urine volume, dysuria and hematuria.  Musculoskeletal: Negative for back pain and neck pain.  Skin: Negative for rash.  Neurological: Positive for headaches. Negative for dizziness, weakness, light-headedness and numbness.  All other systems reviewed and are negative.    Physical Exam Updated Vital Signs BP 138/90   Pulse 77   Temp 98.6 F (37 C) (Oral)   Resp 16   SpO2 93%   Physical Exam  Constitutional: He appears well-developed and well-nourished.  HENT:  Head: Normocephalic and atraumatic.  Eyes: Conjunctivae and EOM are normal. Pupils are equal, round, and reactive to light.  Neck: Neck supple. No JVD present.  Cardiovascular: Normal rate, regular rhythm, intact distal pulses and normal pulses.  No murmur heard. Pulmonary/Chest: Effort normal and breath sounds normal. No stridor. No tachypnea. No respiratory distress. He has no decreased breath sounds. He has no wheezes. He has no rhonchi. He has no rales.  Abdominal: Soft. Bowel sounds are normal. He exhibits no distension. There is no tenderness.  Musculoskeletal: He exhibits no edema.       Right lower leg: Normal. He exhibits no tenderness and no edema.       Left lower leg: Normal. He exhibits no edema.  Neurological: He is alert.  Skin: Skin is warm and dry. Capillary refill takes less than 2 seconds.  Psychiatric: He has a normal mood and affect.  Nursing note and vitals reviewed.    ED Treatments / Results  Labs (all labs ordered are listed, but only abnormal results are displayed) Labs Reviewed  BASIC METABOLIC PANEL - Abnormal; Notable for the following components:      Result Value   CO2 20 (*)    Glucose, Bld 133 (*)    BUN 24 (*)    All other components within normal limits  CBC - Abnormal; Notable for the following components:   RBC 5.95 (*)    Hemoglobin 18.5 (*)    MCHC 36.9 (*)    All other components within normal limits  D-DIMER,  QUANTITATIVE (NOT AT [facility])  I-STAT TROPONIN, ED  I-STAT TROPONIN, ED    EKG  EKG Interpretation  Date/Time:  Saturday March 15 2018 14:06:22 EDT Ventricular Rate:  90 PR Interval:  156 QRS Duration: 92 QT Interval:  366 QTC Calculation: 447 R Axis:   50 Text Interpretation:  Normal sinus rhythm Normal ECG Confirmed by Raeford Razor [phone]) on 03/15/2018 3:42:23 PM       Radiology Dg Chest 2 View  Result Date: 03/15/2018 CLINICAL DATA:  Chest pain today EXAM: CHEST - 2 VIEW COMPARISON:  None. FINDINGS: Normal heart size. Lungs clear. No pneumothorax. No pleural effusion. IMPRESSION: No active cardiopulmonary disease. Electronically Signed   By: Jolaine Click M.D.   On: 03/15/2018 14:54    Procedures Procedures (including critical care time)  Medications Ordered in ED Medications  aspirin  chewable tablet 324 mg (324 mg Oral Given 03/15/18 1640)     Initial Impression / Assessment and Plan / ED Course  I have reviewed the triage vital signs and the nursing notes.  Pertinent labs & imaging results that were available during my care of the patient were reviewed by me and considered in my medical decision making (see chart for details).      Final Clinical Impressions(s) / ED Diagnoses   Final diagnoses:  Chest pain, unspecified type   52 y/o male with h/o T2DM and HTN presents to the ED today c/o chest pain. BMP with mildly elevated BUN to 24 likely due to dehydration, also with CO2 of 20. CBC overall reassuring, with mildly elevated hgb to 18.5 which could represent hemoconcentration. DDimer negative, doubt PE. Trop negative x2, doubt ACS. CP has resolved since arriving to the ED and has not had any episodes of pain. Chest pain is not likely of cardiac or pulmonary etiology d/t presentation, PERC negative, VSS, no tracheal deviation, no JVD or new murmur, RRR, breath sounds equal bilaterally, EKG without acute abnormalities, and negative CXR. Heart score 4.  Patient is to be  discharged with recommendation to follow up with PCP in regards to today's hospital visit. Pt has been advised to return to the ED if CP becomes exertional, associated with diaphoresis or nausea, radiates to left jaw/arm, worsens or becomes concerning in any way. Pt appears reliable for follow up and is agreeable to discharge.   Case has been discussed with and Dr. Juleen China who does not feel that third troponin is needed at this time and that pt can be discharged to f/u as an outpatient.   ED Discharge Orders    None       Rayne Du 03/15/18 1836    Raeford Razor, MD 03/16/18 1312

## 2018-03-15 NOTE — ED Notes (Signed)
Ramon escorting patient to ed by wheelchair.

## 2018-03-15 NOTE — Discharge Instructions (Signed)
We are taking you to the Emergency Department for further evaluation of your chest pain and shortness of breath.

## 2018-03-15 NOTE — ED Triage Notes (Signed)
Reports sudden onset left chest pressure with left upper arm soreness and SOB while walking in a store @ 1200.  Pain has been constant.  Continues to feel SOB and chest pressure.  Denies nausea, but states "no appetite".

## 2018-03-15 NOTE — ED Triage Notes (Signed)
Patient complains of chest pain that started today at 1230pm while walking around grocery store, reports SOB with same. On arrival pain resolving. Alert and oriented, NAD

## 2018-03-15 NOTE — Telephone Encounter (Signed)
Telephone Communication Note    Returned after-hours call from patient for c/c of high BP.    Never fully felt better after stopping lisinopril.  BP was 169/71 at [facility] on Tuesday of this week.  Blood work that he had done "looked ok" from that visit.  Was put on another blood pressure medication then (hydrochlorothiazide) and has been taking since Tuesday.  Since then he continues to feel generally unwell, with nonproductive cough, chest pain in the setting of some recent significant stressors while on vacation.  Noticed higher BP yesterday when he checked it at [facility] while he was returning a movie, near 381 systolic today.  O2 down to 86% as well.  Feeling more winded that usual.  No headache or vision changes.  No fevers.  No changes in his urination, has been clear to light yellow in coloration.  No hematuria.  He was pulling into an urgent care as we were finishing our conversation and agreed he needs to be seen soon with his constellation of symptoms and reported vitals.  Advised that he make sure they do an ECG to evaluate for any ischemic changes.  Depending on his exam a CXR would also likely be indicated.  He expressed understanding and I gave him our office fax so that we could get reports from his evaluation.      Krystal Eaton, MD  [facility]

## 2018-03-17 ENCOUNTER — Other Ambulatory Visit: Payer: Self-pay | Admitting: Primary Care

## 2018-03-17 ENCOUNTER — Ambulatory Visit: Payer: No Typology Code available for payment source | Attending: Primary Care | Admitting: Primary Care

## 2018-03-17 VITALS — BP 132/88 | HR 100 | Ht 73.0 in | Wt 267.8 lb

## 2018-03-17 DIAGNOSIS — I10 Essential (primary) hypertension: Secondary | ICD-10-CM

## 2018-03-17 DIAGNOSIS — R079 Chest pain, unspecified: Secondary | ICD-10-CM

## 2018-03-17 NOTE — Progress Notes (Deleted)
Error - duplicate
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
## 2018-03-17 NOTE — Telephone Encounter (Signed)
Last seen 02-25-18 and next apt 05-23-18. CMP 11-18 and HgbA1c 2/19 and BMP and HgbA1c ordered.

## 2018-03-18 ENCOUNTER — Other Ambulatory Visit
Admission: RE | Admit: 2018-03-18 | Discharge: 2018-03-18 | Disposition: A | Discharge status: Home / Self Care | Payer: No Typology Code available for payment source | Source: Ambulatory Visit | Attending: Primary Care | Admitting: Primary Care

## 2018-03-18 DIAGNOSIS — R079 Chest pain, unspecified: Secondary | ICD-10-CM | POA: Insufficient documentation

## 2018-03-18 DIAGNOSIS — E119 Type 2 diabetes mellitus without complications: Secondary | ICD-10-CM | POA: Insufficient documentation

## 2018-03-18 DIAGNOSIS — I10 Essential (primary) hypertension: Secondary | ICD-10-CM | POA: Insufficient documentation

## 2018-03-18 LAB — BASIC METABOLIC PANEL
Anion Gap: 16 (ref 7–16)
CO2: 27 mmol/L (ref 20–28)
Calcium: 10 mg/dL (ref 8.6–10.2)
Chloride: 96 mmol/L (ref 96–108)
Creatinine: 1.4 mg/dL — ABNORMAL HIGH (ref 0.67–1.17)
GFR,Black: 67 *
GFR,Caucasian: 58 * — AB
Glucose: 257 mg/dL — ABNORMAL HIGH (ref 60–99)
Lab: 24 mg/dL — ABNORMAL HIGH (ref 6–20)
Potassium: 4.4 mmol/L (ref 3.3–5.1)
Sodium: 139 mmol/L (ref 133–145)

## 2018-03-18 LAB — MICROALBUMIN, URINE, RANDOM
Creatinine,UR: 160 mg/dL (ref 20–300)
Microalb/Creat Ratio: 59 mg MA/g CR — ABNORMAL HIGH (ref 0.0–29.9)
Microalbumin,UR: 9.44 mg/dL

## 2018-03-18 LAB — T4, FREE: Free T4: 1.5 ng/dL (ref 0.9–1.7)

## 2018-03-18 LAB — TSH: TSH: 2.03 u[IU]/mL (ref 0.27–4.20)

## 2018-03-18 NOTE — Telephone Encounter (Signed)
Last seen 03-17-18 and next apt 05-23-18.  Labs up to date and ordered.

## 2018-03-19 ENCOUNTER — Ambulatory Visit
Admit: 2018-03-19 | Discharge: 2018-03-19 | Disposition: A | Discharge status: Home / Self Care | Payer: No Typology Code available for payment source | Attending: Primary Care | Admitting: Primary Care

## 2018-03-19 ENCOUNTER — Telehealth: Payer: Self-pay | Admitting: Primary Care

## 2018-03-19 NOTE — Telephone Encounter (Signed)
-----   Message from Krystal Eaton, MD sent at 03/19/2018  2:46 PM EDT -----  Please let patient know that the kidneys are a little on the dehydrated side, which can be seen with the hydrochlorothiazide.  The thyroid tests were normal.  I'll keep an eye out for the urine collection results.  No medication changes at this time.

## 2018-03-19 NOTE — Telephone Encounter (Signed)
Spoke with patient - reviewed message below.

## 2018-03-24 ENCOUNTER — Other Ambulatory Visit
Admission: RE | Admit: 2018-03-24 | Discharge: 2018-03-24 | Disposition: A | Discharge status: Home / Self Care | Payer: No Typology Code available for payment source | Source: Ambulatory Visit | Attending: Primary Care | Admitting: Primary Care

## 2018-03-24 DIAGNOSIS — R079 Chest pain, unspecified: Secondary | ICD-10-CM | POA: Insufficient documentation

## 2018-03-26 ENCOUNTER — Ambulatory Visit: Payer: No Typology Code available for payment source

## 2018-03-26 ENCOUNTER — Telehealth: Payer: Self-pay | Admitting: Primary Care

## 2018-03-26 NOTE — Telephone Encounter (Signed)
That looks great.  Good to hear he's feeling back to himself.  No changes at this time.  Will keep an eye out for the hormonal testing he recently had completed; results are still pending.      Krystal Eaton, MD

## 2018-03-26 NOTE — Telephone Encounter (Signed)
Told patient:   That looks great.  Good to hear he's feeling back to himself.  No changes at this time.  Will keep an eye out for the hormonal testing he recently had completed; results are still pending.

## 2018-03-26 NOTE — Telephone Encounter (Signed)
Walk in blood pressure check  120/78  States he is feeling good, back to himself

## 2018-03-28 LAB — CATECHOLAMINES, FRACTIONATED, URINE
Dopamine,Ur: 232 ug/d (ref 77–324)
Dopamine/Creat: 140 ug/g CRT (ref 0–250)
Dopamine/Vol: 80 ug/L
Epinephrine,Ur: 6 ug/d (ref 1–7)
Epinephrine/Creat: 4 ug/g CRT (ref 0–20)
Epinephrine/Vol: 2 ug/L
Norepinephrine,Ur: 44 ug/d (ref 16–71)
Norepinephrine/Creat: 26 ug/g CRT (ref 0–45)
Norepinephrine/Vol: 15 ug/L

## 2018-03-28 LAB — METANEPHRINES, URINE
Collection Period: 24 hr
Creat,Ur 24hr: 1653 mg/d (ref 1000–2500)
Creat,Ur: 57 mg/dL
Metaneph/Creat Ratio: 84 ug/g CRT (ref 0–300)
Metanephrine/Vol: 48 ug/L
Metanephrines, Ur: 139 ug/d (ref 62–207)
Normetanephrine, Ur: 322 ug/d (ref 125–510)
Normetanephrine/Creat: 195 ug/g CRT (ref 0–400)
Normetanephrine/Vol: 111 ug/L
Time End: 6
Time Start: 6
Total Volume,UR: 2900 mL

## 2018-04-11 ENCOUNTER — Other Ambulatory Visit: Payer: Self-pay | Admitting: Primary Care

## 2018-04-11 MED ORDER — HYDROCHLOROTHIAZIDE 25 MG PO TABS *I*
25.0000 mg | ORAL_TABLET | Freq: Every morning | ORAL | 5 refills | Status: DC
Start: 2018-04-11 — End: 2018-09-24

## 2018-04-11 NOTE — Telephone Encounter (Signed)
lv   03/17/18     nv  05/23/18

## 2018-04-30 ENCOUNTER — Other Ambulatory Visit: Payer: No Typology Code available for payment source | Admitting: Gastroenterology

## 2018-05-22 ENCOUNTER — Telehealth: Payer: Self-pay | Admitting: Primary Care

## 2018-05-22 ENCOUNTER — Other Ambulatory Visit
Admission: RE | Admit: 2018-05-22 | Discharge: 2018-05-22 | Disposition: A | Discharge status: Home / Self Care | Payer: No Typology Code available for payment source | Source: Ambulatory Visit | Attending: Primary Care | Admitting: Primary Care

## 2018-05-22 DIAGNOSIS — E119 Type 2 diabetes mellitus without complications: Secondary | ICD-10-CM | POA: Insufficient documentation

## 2018-05-22 LAB — HEMOGLOBIN A1C: Hemoglobin A1C: 6.8 % — ABNORMAL HIGH

## 2018-05-22 NOTE — Telephone Encounter (Signed)
Spoke with Noah Craig at the [facility] in [place] [phone]).  She confirmed patient had a diabetic eye exam completed on 01/17/18. She will fax the results.

## 2018-05-23 ENCOUNTER — Telehealth: Payer: Self-pay | Admitting: Primary Care

## 2018-05-23 ENCOUNTER — Ambulatory Visit: Payer: No Typology Code available for payment source | Attending: Primary Care | Admitting: Primary Care

## 2018-05-23 ENCOUNTER — Other Ambulatory Visit: Payer: Self-pay | Admitting: Primary Care

## 2018-05-23 VITALS — BP 118/72 | HR 64 | Ht 73.0 in | Wt 267.2 lb

## 2018-05-23 DIAGNOSIS — I10 Essential (primary) hypertension: Secondary | ICD-10-CM

## 2018-05-23 DIAGNOSIS — E119 Type 2 diabetes mellitus without complications: Secondary | ICD-10-CM

## 2018-05-23 LAB — UNABLE TO PERFORM ADD-ON TESTING 1

## 2018-05-23 NOTE — Telephone Encounter (Signed)
Left message (ok) on voice mail.

## 2018-05-23 NOTE — Progress Notes (Signed)
[facility]  Outpatient Visit Note       Reason For Visit:   Chief Complaint   Patient presents with    Follow-up     Using Lantus dose PRN.        Subjective:      Noah Craig is a 52 y.o. year old man with a history of HTN, HLD, DM, presenting for follow up.  Issues discussed below.    DM:  Overall he has been feeling better in the interim.  He has been working on weight loss and using less insulin.  He typically is using his glargine if he averages over 150 on his blood sugars.  He denies any hypoglycemia in the past month.     HTN:   Continues on hydrochlorothiazide 25 mg daily.  No lightheadedness, CP, SOB.  Was on lisinopril previously which was complicated by a cough.  He does have some proteinuria on his urine microalbumin testing so we would like to get him back on ACEi/ARB therapy.      Home Medications:     Prior to Admission medications    Medication Sig Start Date End Date Taking? Authorizing Provider   hydrochlorothiazide (HYDRODIURIL) 25 MG tablet Take 1 tablet (25 mg total) by mouth every morning 04/11/18  Yes Krystal Eaton, MD   JARDIANCE 10 MG tablet TAKE 1 TABLET BY MOUTH EVERY MORNING 03/18/18  Yes Collier Bullock, PA   Continuous Blood Gluc Sensor (FREESTYLE LIBRE 14 DAY SENSOR) MISC APPLY SENSOR TO BACK OF UPPER ARM, REPLACE SENSOR EVERY 14 DAYS 03/17/18  Yes Krystal Eaton, MD   metFORMIN (GLUCOPHAGE-XR) 500 MG 24 hr tablet TAKE 2 TABLETS BY MOUTH TWO TIMES DAILY ( AFTER MEALS ) SWALLOW WHOLE , DO NOT CRUSH , BREAK , OR CHEW. 01/16/18  Yes Krystal Eaton, MD   glipiZIDE (GLUCOTROL) 10 MG tablet TAKE 1 TABLET BY MOUTH TWO TIMES DAILY BEFORE MEALS 01/16/18  Yes Krystal Eaton, MD   B-D ULTRAFINE III SHORT PEN 31G X 8 MM USE ONCE DAILY AS DIRECTED 01/01/18  Yes Krystal Eaton, MD   continuous blood glucose monitor (FREESTYLE LIBRE) reader Use with Freestyle Libre CGM sensor 11/28/17  Yes Krystal Eaton, MD   ketotifen (ZADITOR) 0.025 % ophthalmic solution  Place 1 drop into the right eye 2 times daily as needed for Allergies (Itching) 08/07/17  Yes Krystal Eaton, MD   insulin glargine (LANTUS SOLOSTAR) 100 unit/mL injection pen Inject 35 Units into the skin nightly 09/18/16  Yes Talmage Nap, MD   naproxen sodium (ANAPROX) 220 MG tablet Take 220 mg by mouth 2 times daily (with meals)   Pt takes as needed   Yes [provider]       Allergies:     Allergies   Allergen Reactions    Victoza [Liraglutide] Other (See Comments)     GI upset    No Known Latex Allergy      Created by Conversion - 0;      Review of Systems:     As detailed above by problem.     Physical Exam:  Temp Readings from Last 3 Encounters:   12/18/17 36.3 C (97.3 F) (Temporal)   08/07/17 36.6 C (97.9 F) (Temporal)   01/09/16 36.8 C (98.3 F) (Tympanic)     BP Readings from Last 3 Encounters:   05/23/18 118/72   03/26/18 120/78   03/17/18 132/88     Pulse Readings from Last 3 Encounters:   05/23/18  64   03/17/18 100   02/25/18 72         Vitals:    05/23/18 0913   BP: 118/72   BP Location: Right arm   Patient Position: Sitting   Cuff Size: large adult   Pulse: 64   Weight: 121.2 kg (267 lb 3.2 oz)   Height: 1.854 m (6\' 1" )     Wt Readings from Last 3 Encounters:   05/23/18 121.2 kg (267 lb 3.2 oz)   03/17/18 121.5 kg (267 lb 12.8 oz)   02/25/18 126.6 kg (279 lb)     General: Well-developed male, in no acute distress.  Lungs: Breathing comfortably at rest.  Lungs CTAB without adventitious sounds.  Cardiovascular: RRR no m/r/g.  Extremities warm and dry to touch.   Neurologic: Fully interactive and cooperative with exam.  Able to provide an accurate history.  Speech and mood appropriate.      ASSESSMENT and Plan:     52 y.o. year old male with HTN, HLD, DM, presenting for follow up.  Plan by problem below.    1. Type 2 diabetes mellitus   - Controlled.  Continue current doses of Jardiance, metformin, glipizide, and glargine.  Update urine microalbumin and A1c in 3-4 months as this helps  keep him motivated.  See below regarding blood pressure medications.     2. Essential hypertension - controlled.  However we should favor ACE or ARB therapy with his DM and proteinuria.  Added on BMP to his A1c that he had drawn yesterday.  As long as his renal function looks okay, we will plan to change from the hydrochlorothiazide to when he 5 mg of losartan daily.  Repeat electrolytes 2 weeks after the medication change.         Krystal Eaton, MD 05/23/2018 9:21 AM

## 2018-05-23 NOTE — Telephone Encounter (Signed)
-----   Message from Krystal Eaton, MD sent at 05/23/2018 12:32 PM EDT -----  Please let patient know the additional add-on testing could not be performed with the labs he already had drawn. He can get his kidney function and electrolytes updated anytime this upcoming week, then we can make a decision about changing his blood pressure medicine.

## 2018-06-02 NOTE — H&P (Signed)
Chief Complaint: Screening colonoscopy/HO Polyps    History of Present Illness  Pt is a 52 y.o.  male presents [facility] for colonoscopy.  Past Medical History:   Diagnosis Date    Arthritis     knees, hands    Colon polyp     Complication of anesthesia     slow to awaken    Diabetes mellitus ([place])      Past Surgical History:   Procedure Laterality Date    KNEE SURGERY Bilateral     reconstruction; several surgeries    TONSILLECTOMY       Allergies   Allergen Reactions    Lisinopril Cough    Victoza [Liraglutide] Other (See Comments)     GI upset    No Known Latex Allergy      Created by Conversion - 0;      Current Outpatient Prescriptions   Medication    hydrochlorothiazide (HYDRODIURIL) 25 MG tablet    JARDIANCE 10 MG tablet    Continuous Blood Gluc Sensor (FREESTYLE LIBRE 14 DAY SENSOR) MISC    metFORMIN (GLUCOPHAGE-XR) 500 MG 24 hr tablet    glipiZIDE (GLUCOTROL) 10 MG tablet    B-D ULTRAFINE III SHORT PEN 31G X 8 MM    continuous blood glucose monitor (FREESTYLE LIBRE) reader    ketotifen (ZADITOR) 0.025 % ophthalmic solution    insulin glargine (LANTUS SOLOSTAR) 100 unit/mL injection pen    naproxen sodium (ANAPROX) 220 MG tablet     No current facility-administered medications for this encounter.      Social History     Social History    Marital status: Married     Spouse name: N/A    Number of children: N/A    Years of education: N/A     Occupational History    Not on file.     Social History Main Topics    Smoking status: Former Smoker    Smokeless tobacco: Former Systems developer     Quit date: 01/01/1992    Alcohol use Yes      Comment: beer socially    Drug use: No    Sexual activity: Not on file     Social History Narrative    No narrative on file       Family History   Problem Relation Age of Onset    Heart Disease Father          Review of Systems:  Pertinent items are noted in HPI.    Physical Exam:  There were no vitals filed for this visit.  Neuro: A+O x 3  Chest:  CTA  Cor: RRR  Abdomen is soft, nontender, nondistended with no palpable masses.     Assessment and Plan:  I discussed the risks, benefits, and alternatives for the procedure.   Plan for Colonoscopy  ASA 2  Oral airway satisfactory

## 2018-06-03 ENCOUNTER — Encounter: Payer: Self-pay | Admitting: Internal Medicine

## 2018-06-03 ENCOUNTER — Ambulatory Visit
Admission: RE | Admit: 2018-06-03 | Discharge: 2018-06-03 | Disposition: A | Discharge status: Home / Self Care | Payer: No Typology Code available for payment source | Source: Ambulatory Visit | Attending: Internal Medicine | Admitting: Internal Medicine

## 2018-06-03 DIAGNOSIS — Z1211 Encounter for screening for malignant neoplasm of colon: Secondary | ICD-10-CM | POA: Insufficient documentation

## 2018-06-03 DIAGNOSIS — D128 Benign neoplasm of rectum: Secondary | ICD-10-CM | POA: Insufficient documentation

## 2018-06-03 DIAGNOSIS — K648 Other hemorrhoids: Secondary | ICD-10-CM | POA: Insufficient documentation

## 2018-06-03 DIAGNOSIS — Z86010 Personal history of colonic polyps: Secondary | ICD-10-CM | POA: Insufficient documentation

## 2018-06-03 DIAGNOSIS — K5730 Diverticulosis of large intestine without perforation or abscess without bleeding: Secondary | ICD-10-CM | POA: Insufficient documentation

## 2018-06-03 HISTORY — DX: Essential (primary) hypertension: I10

## 2018-06-03 LAB — POCT GLUCOSE: Glucose POCT: 158 mg/dL — ABNORMAL HIGH (ref 60–99)

## 2018-06-03 LAB — HM COLONOSCOPY

## 2018-06-03 MED ORDER — MIDAZOLAM HCL 5 MG/ML IJ SOLUTION *WRAPPED*
INTRAMUSCULAR | Status: AC | PRN
Start: 2018-06-03 — End: 2018-06-03
  Administered 2018-06-03: 5 mg via INTRAVENOUS

## 2018-06-03 MED ORDER — FENTANYL CITRATE 50 MCG/ML IJ SOLN *WRAPPED*
INTRAMUSCULAR | Status: AC | PRN
Start: 2018-06-03 — End: 2018-06-03
  Administered 2018-06-03: 100 ug via INTRAVENOUS

## 2018-06-03 MED ORDER — SIMETHICONE 40 MG/0.6ML PO SUSP WRAPPED *I*
ORAL | Status: AC
Start: 2018-06-03 — End: 2018-06-03
  Filled 2018-06-03: qty 30

## 2018-06-03 MED ORDER — FENTANYL CITRATE 50 MCG/ML IJ SOLN *WRAPPED*
INTRAMUSCULAR | Status: AC
Start: 2018-06-03 — End: 2018-06-03
  Filled 2018-06-03: qty 2

## 2018-06-03 MED ORDER — MIDAZOLAM HCL 5 MG/ML IJ SOLUTION *WRAPPED*
INTRAMUSCULAR | Status: AC
Start: 2018-06-03 — End: 2018-06-03
  Filled 2018-06-03: qty 2

## 2018-06-03 NOTE — Procedures (Signed)
Procedure Report  Routine Colonoscopy/Ho Polyps    PCP:  Krystal Eaton, MD     The patient denies changes in his bowels or blood in his stools. The patient was examined prior to the procedure.    The cardiac exam revealed normal S1/S2 without S3 or murmurs.  The pulmonary exam revealed normal breath sounds.  The patient was deemed an ASA Class  2.  The patient was examined prior to the procedure, and his  questions were answered to his satisfaction. The patient elected to undergo a colonoscopy.    Medicines Given During the Procedure:       Medication Administration - last 24 hours from 06/03/2018 1255 to 06/03/2018 1314       Date/Time Order Dose Route Action Action by     06/03/2018 1300 fentaNYL (SUBLIMAZE) 50 mcg/mL injection 100 mcg Intravenous Given Linward Headland, RN     06/03/2018 1300 midazolam (VERSED) 5 mg/mL injection 5 mg Intravenous Given Linward Headland, RN          The Olympus variable tension colonoscope was advanced to the cecum with mild difficulty. The scope was withdrawn through the entire colon. The prep was good. A J-maneuver was performed in the rectum and there were small internal hemorrhoids.    Colonoscopy to the cecum with one 5 mm rectal polyp s/p cold snare removal, no inflammatory mucosal changes and mild diverticula.    I recommend a follow-up colonoscopy in 5 years' time. The patient was instructed to call if they have any new problems, i.e. diarrhea, constipation, lower abdominal cramps, changes associated with hematochezia, or if the patient is hemoccult positive. In that case, a colonoscopy may need to be performed again.

## 2018-06-03 NOTE — Discharge Instructions (Signed)
Diverticulosis   WHAT YOU NEED TO KNOW:   Diverticulosis is a condition that causes small pockets called diverticula to form in your intestine. These pockets make it difficult for bowel movements to pass through your digestive system.       DISCHARGE INSTRUCTIONS:   Return to the emergency department if:   · You have severe pain on the left side of your lower abdomen.    · Your bowel movements are bright or dark red.  Contact your healthcare provider if:   · You have a fever and chills.    · You feel dizzy or lightheaded.     · You have nausea, or you are vomiting.    · You have a change in your bowel movements.    · You have questions or concerns about your condition or care.  Medicines:   · Medicines  to soften your bowel movements may be given. You may also need medicines to treat symptoms such as bloating and pain.    · Take your medicine as directed.  Contact your healthcare provider if you think your medicine is not helping or if you have side effects. Tell him or her if you are allergic to any medicine. Keep a list of the medicines, vitamins, and herbs you take. Include the amounts, and when and why you take them. Bring the list or the pill bottles to follow-up visits. Carry your medicine list with you in case of an emergency.  Self-care:  The goal of treatment is to manage any symptoms you have and prevent other problems such as diverticulitis. Diverticulitis is swelling or infection of the diverticula. Your healthcare provider may recommend any of the following:  · Eat a variety of high-fiber foods.  High-fiber foods help you have regular bowel movements. High-fiber foods include cooked beans, fruits, vegetables, and some cereals. Most adults need 25 to 35 grams of fiber each day. Your healthcare provider may recommend that you have more. Ask your healthcare provider how much fiber you need. Increase fiber slowly. You may have abdominal discomfort, bloating, and gas if you add fiber to your diet too  quickly. You may need to take a fiber supplement if you are not getting enough fiber from food.           · Drink liquids as directed.  You may need to drink 2 to 3 liters (8 to 12 cups) of liquids every day. Ask your healthcare provider how much liquid to drink each day and which liquids are best for you.    · Apply heat  on your abdomen for 20 to 30 minutes every 2 hours for as many days as directed. Heat helps decrease pain and muscle spasms.  Help prevent diverticulitis or other symptoms:  The following may help decrease your risk for diverticulitis or symptoms, such as bleeding. Talk to your provider about these or other things you can do to prevent problems that may occur with diverticulosis.  · Exercise regularly.  Ask your healthcare provider about the best exercise plan for you. Exercise can help you have regular bowel movements. Get 30 minutes of exercise on most days of the week.     · Maintain a healthy weight.  Ask your healthcare provider how much you should weigh. Ask him or her to help you create a weight loss plan if you are overweight.     · Do not smoke.  Nicotine and other chemicals in cigarettes increase your risk for diverticulitis. Ask   your healthcare provider for information if you currently smoke and need help to quit. E-cigarettes or smokeless tobacco still contain nicotine. Talk to your healthcare provider before you use these products.      Ask your healthcare provider if it is safe to take NSAIDs.  NSAIDs may increase your risk of diverticulitis.  Follow up with your healthcare provider as directed:  Write down your questions so you remember to ask them during your visits.    Copyright [employer] 2018 Information is for [employer] use only and may not be sold, redistributed or otherwise used for commercial purposes. All illustrations and images included in CareNotes are the copyrighted property of [employer].D[employer]M., Inc[employer] or [place]  The above information is an educational aid  only. It is not intended as medical advice for individual conditions or treatments. Talk to your doctor, nurse or pharmacist before following any medical regimen to see if it is safe and effective for you.  Discharge Instructions: Colonoscopy    Procedure: Colonoscopy      Special Instructions:   You have received medication that has a sedating/amnesic effect and therefore interferes with your memory and normal reaction time. DO NOT operate motor vehicles, machinery or power tools for 24 hours. Do not sign any legal documents for 24 hours. Do not make any major decisions. Do not consume alcohol. Rest at home with only light activity for the rest of today and resume normal activities tomorrow. If you use a CPAP machine for sleep, use your CPAP when you are resting at home after the procedure.   If you develop abdominal pain, nausea or vomiting, fever, chills, bleeding that does not stop, contact your physician.    Call your physician for any persistent changes in bowel habits: (i.e., diarrhea, constipation, bloody stools change in stool shape or size or cramping.     Medications:   Resume all previous medications unless instructed as above.    Follow-Up Care:   Have stools tested for blood yearly with annual physical.   Follow up colonoscopy in 5 years.   Instructional pamphlets: Diverticular    No ASA or NSAIDs for 3 days    Diet: regular     Follow-Up Appointment: none    If You Have A Problem:      Dr. Marcello Moores: [phone]    [facility]     Emergency Department  [phone]    [phone]  7AM to 4PM weekdays   24 hours per day

## 2018-06-04 LAB — SURGICAL PATHOLOGY

## 2018-06-10 ENCOUNTER — Other Ambulatory Visit: Payer: Self-pay | Admitting: Primary Care

## 2018-06-10 DIAGNOSIS — E119 Type 2 diabetes mellitus without complications: Secondary | ICD-10-CM

## 2018-06-10 NOTE — Telephone Encounter (Signed)
Last seen 05-23-18 and next apt 09-24-18.  Last Alc 5/19 and ordered.

## 2018-06-15 ENCOUNTER — Other Ambulatory Visit: Payer: Self-pay | Admitting: Internal Medicine

## 2018-06-16 NOTE — Telephone Encounter (Signed)
Last seen 03-17-18 and next apt 09-24-18. Alc and BMP 5/19 and CMP and Alc ordered.

## 2018-07-29 ENCOUNTER — Other Ambulatory Visit: Payer: Self-pay | Admitting: Primary Care

## 2018-08-25 ENCOUNTER — Other Ambulatory Visit
Admission: RE | Admit: 2018-08-25 | Discharge: 2018-08-25 | Disposition: A | Discharge status: Home / Self Care | Payer: No Typology Code available for payment source | Source: Ambulatory Visit | Attending: Primary Care | Admitting: Primary Care

## 2018-08-25 DIAGNOSIS — I10 Essential (primary) hypertension: Secondary | ICD-10-CM | POA: Insufficient documentation

## 2018-08-25 DIAGNOSIS — E119 Type 2 diabetes mellitus without complications: Secondary | ICD-10-CM

## 2018-08-25 LAB — LIPID PANEL
Chol/HDL Ratio: 3.6
Cholesterol: 101 mg/dL
HDL: 28 mg/dL
LDL Calculated: 43 mg/dL
Non HDL Cholesterol: 73 mg/dL
Triglycerides: 149 mg/dL

## 2018-08-25 LAB — COMPREHENSIVE METABOLIC PANEL
ALT: 54 U/L — ABNORMAL HIGH (ref 0–50)
AST: 35 U/L (ref 0–50)
Albumin: 4.6 g/dL (ref 3.5–5.2)
Alk Phos: 97 U/L (ref 40–130)
Anion Gap: 13 (ref 7–16)
Bilirubin,Total: 0.6 mg/dL (ref 0.0–1.2)
CO2: 28 mmol/L (ref 20–28)
Calcium: 10.1 mg/dL (ref 8.6–10.2)
Chloride: 99 mmol/L (ref 96–108)
Creatinine: 1.16 mg/dL (ref 0.67–1.17)
GFR,Black: 84 *
GFR,Caucasian: 72 *
Glucose: 198 mg/dL — ABNORMAL HIGH (ref 60–99)
Lab: 30 mg/dL — ABNORMAL HIGH (ref 6–20)
Potassium: 4.1 mmol/L (ref 3.3–5.1)
Sodium: 140 mmol/L (ref 133–145)
Total Protein: 7.3 g/dL (ref 6.3–7.7)

## 2018-08-25 LAB — HEMOGLOBIN A1C: Hemoglobin A1C: 7.7 % — ABNORMAL HIGH

## 2018-08-26 ENCOUNTER — Telehealth: Payer: Self-pay | Admitting: Primary Care

## 2018-08-26 NOTE — Telephone Encounter (Signed)
-----   Message from Krystal Eaton, MD sent at 08/26/2018 12:41 PM EDT -----  Please let patient know his A1c is uncontrolled at 7.7.  Monitor the evening and AM blood sugars for 1-2 weeks and send [place] those numbers to see how we can best adjust his meds.  Otherwise weight loss is going to be critical for getting this under control in the long-run.

## 2018-08-26 NOTE — Telephone Encounter (Signed)
Ok thank you, no changes to his medications as ordered, then.  Krystal Eaton, MD

## 2018-08-26 NOTE — Telephone Encounter (Signed)
Spoke with patient.  He said he hadn't been feeling well the past ten days and knew his number would be elevated.  He started back in the insulin about five days ago and his numbers are already improving.  He is down from 210 to 150.  He will keep track of his numbers and send them in.

## 2018-08-28 ENCOUNTER — Encounter: Payer: Self-pay | Admitting: Gastroenterology

## 2018-08-29 ENCOUNTER — Encounter: Payer: Self-pay | Admitting: Gastroenterology

## 2018-09-04 ENCOUNTER — Ambulatory Visit: Payer: No Typology Code available for payment source | Attending: Otolaryngology | Admitting: Otolaryngology

## 2018-09-04 ENCOUNTER — Encounter: Payer: Self-pay | Admitting: Otolaryngology

## 2018-09-04 VITALS — BP 130/72 | Ht 74.0 in | Wt 270.0 lb

## 2018-09-04 DIAGNOSIS — H9042 Sensorineural hearing loss, unilateral, left ear, with unrestricted hearing on the contralateral side: Secondary | ICD-10-CM

## 2018-09-04 DIAGNOSIS — H9313 Tinnitus, bilateral: Secondary | ICD-10-CM

## 2018-09-04 DIAGNOSIS — H903 Sensorineural hearing loss, bilateral: Secondary | ICD-10-CM

## 2018-09-04 DIAGNOSIS — H6690 Otitis media, unspecified, unspecified ear: Secondary | ICD-10-CM

## 2018-09-04 DIAGNOSIS — IMO0001 Reserved for inherently not codable concepts without codable children: Secondary | ICD-10-CM

## 2018-09-04 NOTE — Progress Notes (Signed)
MR head with and without contrast scheduled [facility] 09/15/18 @ 3:30, labs given to have done prior to MR. Dr. Maureen Chatters to call with results. All confirmed with Sherren Mocha

## 2018-09-04 NOTE — Progress Notes (Addendum)
Noah Craig was referred by Dr. [name].    Subjective  Chief Complaint: He presents today for hearing loss, tinnitus    HPI: This is a 52 y.o. old male, who was seen at  [facility] for complaints of hearing loss over the past 10 years and intermittent tinnitus left greater than right.  No history of ear disease or ear surgery.  No family history of hearing loss.  No vertigo.  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    Outpatient Prescriptions Marked as Taking for the 09/04/18 encounter (Office Visit) with [name], MD   Medication Sig Dispense Refill    metFORMIN (GLUCOPHAGE-XR) 500 MG 24 hr tablet TAKE 2 TABLETS BY MOUTH TWO TIMES DAILY (AFTER MEALS) -SWALLOW WHOLE, DO NOT CRUSH, BREAK, OR CHEW 120 tablet 5    glipiZIDE (GLUCOTROL) 10 MG tablet TAKE 1 TABLET BY MOUTH TWO TIMES DAILY BEFORE MEALS 60 tablet 5    JARDIANCE 10 MG tablet TAKE 1 TABLET BY MOUTH EVERY MORNING 30 tablet 5    Continuous Blood Gluc Sensor (FREESTYLE LIBRE 14 DAY SENSOR) MISC APPLY SENSOR TO BACK OF UPPER ARM, REPLACE SENSOR EVERY 14 DAYS 2 each 5    insulin glargine (LANTUS) 100 UNIT/ML injection vial Inject into the skin daily      hydrochlorothiazide (HYDRODIURIL) 25 MG tablet Take 1 tablet (25 mg total) by mouth every morning 30 tablet 5    B-D ULTRAFINE III SHORT PEN 31G X 8 MM USE ONCE DAILY AS DIRECTED 100 each 3    continuous blood glucose monitor (FREESTYLE LIBRE) reader Use with Freestyle Libre CGM sensor 1 Device 0    insulin glargine (LANTUS SOLOSTAR) 100 unit/mL injection pen Inject 35 Units into the skin nightly 100 mL 3    naproxen sodium (ANAPROX) 220 MG tablet Take 220 mg by mouth 2 times daily (with meals)   Pt takes as needed         Medication allergies: Lisinopril; Victoza [liraglutide]; and No known latex allergy    Problem List: has Diabetes mellitus; Essential hypertension; Mixed hyperlipidemia; and Strain of Achilles tendon, right, initial encounter on  his problem list.    Medical History:   Past Medical History:   Diagnosis Date    Arthritis     knees, hands    Colon polyp     Complication of anesthesia     slow to awaken    Diabetes mellitus     Hypertension        Surgical History:   Past Surgical History:   Procedure Laterality Date    JOINT REPLACEMENT      KNEE SURGERY Bilateral     reconstruction; several surgeries    TONSILLECTOMY          Family History: family history includes Heart Disease in his father.    Social History:   Social History   Substance Use Topics    Smoking status: Former Smoker    Smokeless tobacco: Former Systems developer     Quit date: 01/01/1992    Alcohol use Yes      Comment:  monthly       ROS: ENT ROS: as above    Objective  Vitals:    09/04/18 0905   BP: 130/72   Weight: 122.5 kg (270 lb)   Height: 1.88 m (6\' 2" )         Exam  Constitution:  Appears well developed and well nourished. No signs  of acute distress present. Patient is cooperative and overall behavior is appropriate.    Head / Face: Atraumatic, normocephalic on inspection.    Eyes:  EOMI in both eyes. Conjunctivae clear. No periorbital edema of the upper and lower lids. Sclerae clear.     Ears: Inspection reveals no lesion of the ears, swelling of the ears, or tenderness of the ears.  No discharge, mass or stenosis in the auditory canals.     Tympanic membranes - moderate retraction of each tympanic membrane with myringostapediopexy.  No perforation.  No fluid.    Nose: External Nose: exhibits no deformity or lesions. Internal Nose: No discharge from the nasal mucosae, no edema, no erythema of the nasal mucosae. Nasal Septum: not significantly deviated or perforated. Nasal turbinates are normal in color and size.    Oral cavity: Lips appear normal and healthy. Dentition is normal for age. Gums appear healthy. Tongue shows a smooth surface and symmetry. Floor of the mouth appears normal. Salivary glands normal in size with no asymmetry. Submandibular and parotid ducts are  patent bilaterally. Oral mucosa moist with no thrush and no mucositis. Hard palate normal in appearance.    Oropharynx: No lesions or masses. Soft palate normal in appearance. Uvula midline and normal in size. Tonsils appear normal. Posterior pharyngeal mucosa appears normal.    Neck: Symmetric. Palpation reveals no swelling or tenderness. No masses appreciated. Trachea is midline and has good landmarks. Thyroid exhibits no palpable enlargement, nodules or tenderness on palpation.    Neurological: Alert and oriented x 3.  Psychological: Mood is normal. Patient's affect is appropriate to mood. Speech is spontaneous with regular rate, rhythm, and volume.    Audiogram (08/28/2018): Right -  mild to moderately severe sensorineural hearing loss.  Left - mild to severe sensorineural hearing loss  Speech testing:   Speech reception: Reported as in agreement with pure tone findings.    Discrimination: Right - 100%.  Left - 96%  Tympanometry:  Right: Type Ad                                 Left: Type Ad       Assessment    ICD-10-CM ICD-9-CM    1. Sensorineural hearing loss (SNHL), bilateral H90.3 389.18 MR head without and with contrast      Creatinine, serum      Urea nitrogen - venous   2. Asymmetrical left sensorineural hearing loss H90.42 389.16 MR head without and with contrast      Creatinine, serum      Urea nitrogen - venous   3. Tinnitus, bilateral H93.13 388.30 MR head without and with contrast      Creatinine, serum      Urea nitrogen - venous   4. Chronic otitis media H66.90 382.9         Plan  Despite some mild chronic middle ear disease, his hearing loss is entirely sensory.  This loss definitely exceeds what would be expected for his age.  Bilateral hearing aids are recommended.  He happens to have a hearing aid benefit through his private insurance, but may also be eligible for the [employer].  He will investigate.  Regarding the asymmetry of the sensorineural loss and asymmetry of the tinnitus,  we discussed that it is very unlikely he has retrocochlear pathology, but an MRI is not unreasonable.  This will be ordered.  We will check kidney function  due to his diabetes.  We will call him with the results.    Tinnitus counseling given.  Discussed the role of hearing aids and lack of efficacy for medications.      Seen by : [name], MD 09/04/2018

## 2018-09-05 ENCOUNTER — Other Ambulatory Visit
Admission: RE | Admit: 2018-09-05 | Discharge: 2018-09-05 | Disposition: A | Discharge status: Home / Self Care | Payer: No Typology Code available for payment source | Source: Ambulatory Visit | Attending: Otolaryngology | Admitting: Otolaryngology

## 2018-09-05 DIAGNOSIS — H9313 Tinnitus, bilateral: Secondary | ICD-10-CM | POA: Insufficient documentation

## 2018-09-05 DIAGNOSIS — H9042 Sensorineural hearing loss, unilateral, left ear, with unrestricted hearing on the contralateral side: Secondary | ICD-10-CM | POA: Insufficient documentation

## 2018-09-05 DIAGNOSIS — H903 Sensorineural hearing loss, bilateral: Secondary | ICD-10-CM | POA: Insufficient documentation

## 2018-09-05 LAB — CREATININE, SERUM
Creatinine: 1.27 mg/dL — ABNORMAL HIGH (ref 0.67–1.17)
GFR,Black: 75 *
GFR,Caucasian: 65 *

## 2018-09-05 LAB — BUN: Lab: 24 mg/dL — ABNORMAL HIGH (ref 6–20)

## 2018-09-15 ENCOUNTER — Ambulatory Visit
Admission: RE | Admit: 2018-09-15 | Discharge: 2018-09-15 | Disposition: A | Discharge status: Home / Self Care | Payer: No Typology Code available for payment source | Source: Ambulatory Visit | Attending: Otolaryngology | Admitting: Otolaryngology

## 2018-09-15 DIAGNOSIS — IMO0001 Reserved for inherently not codable concepts without codable children: Secondary | ICD-10-CM

## 2018-09-15 DIAGNOSIS — H903 Sensorineural hearing loss, bilateral: Secondary | ICD-10-CM

## 2018-09-15 DIAGNOSIS — H9042 Sensorineural hearing loss, unilateral, left ear, with unrestricted hearing on the contralateral side: Secondary | ICD-10-CM | POA: Insufficient documentation

## 2018-09-15 DIAGNOSIS — H9313 Tinnitus, bilateral: Secondary | ICD-10-CM

## 2018-09-15 MED ORDER — GADOTERIDOL 279.3 MG/ML (PROHANCE) IV SOLN *I*
20.0000 mL | Freq: Once | INTRAVENOUS | Status: AC
Start: 2018-09-15 — End: 2018-09-15
  Administered 2018-09-15: 20 mL via INTRAVENOUS

## 2018-09-17 ENCOUNTER — Telehealth: Payer: Self-pay

## 2018-09-17 NOTE — Telephone Encounter (Signed)
MRI was normal.  Follow-up as needed.  No other medical treatment for the hearing loss or tinnitus.

## 2018-09-17 NOTE — Telephone Encounter (Signed)
I spoke to the patient, He voiced understanding of the MRI result, and has no questions at this time.

## 2018-09-17 NOTE — Telephone Encounter (Signed)
Patient had his MRI done, results in the chart.

## 2018-09-24 ENCOUNTER — Ambulatory Visit: Payer: No Typology Code available for payment source | Attending: Primary Care | Admitting: Primary Care

## 2018-09-24 VITALS — BP 112/70 | HR 68 | Ht 74.0 in | Wt 268.2 lb

## 2018-09-24 DIAGNOSIS — I10 Essential (primary) hypertension: Secondary | ICD-10-CM

## 2018-09-24 DIAGNOSIS — E119 Type 2 diabetes mellitus without complications: Secondary | ICD-10-CM

## 2018-09-24 DIAGNOSIS — Z23 Encounter for immunization: Secondary | ICD-10-CM

## 2018-09-24 MED ORDER — LOSARTAN POTASSIUM 25 MG PO TABS *I*
25.0000 mg | ORAL_TABLET | Freq: Every day | ORAL | 1 refills | Status: DC
Start: 2018-09-24 — End: 2019-03-24

## 2018-09-24 NOTE — Progress Notes (Signed)
[facility]  Outpatient Visit Note       Reason For Visit:   Chief Complaint   Patient presents with    Follow-up     DM       Subjective:      Noah Craig is a 52 y.o. year old man with a history of HTN, HLD, DM, presenting for follow up.  Issues discussed below.    DM: Has been keeping track of his BGs at home - are typically in the 120s in the morning and 110-130 range in the evening before dinner.  Continues on metformin, glipizide, jardiance, and long-acting insulin.  Did travel during the summer on vacation and had some dietary issues there and he has been reeling that in since his return.      HTN:   Continues on hctz 25 mg daily.  No lightheadedness, CP, SOB, or LE edema.  He would be amenable to change to ARB therapy in light of his DM.     Home Medications:     Prior to Admission medications    Medication Sig Start Date End Date Taking? Authorizing Provider   metFORMIN (GLUCOPHAGE-XR) 500 MG 24 hr tablet TAKE 2 TABLETS BY MOUTH TWO TIMES DAILY (AFTER MEALS) -SWALLOW WHOLE, DO NOT CRUSH, BREAK, OR CHEW 07/29/18  Yes Krystal Eaton, MD   glipiZIDE (GLUCOTROL) 10 MG tablet TAKE 1 TABLET BY MOUTH TWO TIMES DAILY BEFORE MEALS 07/29/18  Yes Krystal Eaton, MD   JARDIANCE 10 MG tablet TAKE 1 TABLET BY MOUTH EVERY MORNING 06/16/18  Yes Krystal Eaton, MD   Continuous Blood Gluc Sensor (FREESTYLE LIBRE 14 DAY SENSOR) MISC APPLY SENSOR TO BACK OF UPPER ARM, REPLACE SENSOR EVERY 14 DAYS 06/10/18  Yes Krystal Eaton, MD   hydrochlorothiazide (HYDRODIURIL) 25 MG tablet Take 1 tablet (25 mg total) by mouth every morning 04/11/18  Yes Krystal Eaton, MD   B-D ULTRAFINE III SHORT PEN 31G X 8 MM USE ONCE DAILY AS DIRECTED 01/01/18  Yes Krystal Eaton, MD   continuous blood glucose monitor (FREESTYLE LIBRE) reader Use with Freestyle Libre CGM sensor 11/28/17  Yes Krystal Eaton, MD   insulin glargine (LANTUS SOLOSTAR) 100 unit/mL injection pen Inject 35 Units into the skin  nightly  Patient taking differently: Inject 20 Units into the skin nightly    09/18/16  Yes Talmage Nap, MD   naproxen sodium (ANAPROX) 220 MG tablet Take 220 mg by mouth 2 times daily (with meals)   Pt takes as needed   Yes [provider]       Allergies:     Allergies   Allergen Reactions    Lisinopril Cough    Victoza [Liraglutide] Other (See Comments)     GI upset    No Known Latex Allergy      Created by Conversion - 0;      Review of Systems:     As detailed above by problem.     Physical Exam:  Temp Readings from Last 3 Encounters:   06/03/18 36.3 C (97.3 F) (Temporal)   12/18/17 36.3 C (97.3 F) (Temporal)   08/07/17 36.6 C (97.9 F) (Temporal)     BP Readings from Last 3 Encounters:   09/24/18 112/70   09/04/18 130/72   06/03/18 151/86     Pulse Readings from Last 3 Encounters:   09/24/18 68   06/03/18 80   05/23/18 64         Vitals:    09/24/18 1009  BP: 112/70   BP Location: Right arm   Patient Position: Sitting   Cuff Size: large adult   Pulse: 68   Weight: 121.7 kg (268 lb 3.2 oz)   Height: 1.88 m (6\' 2" )     Wt Readings from Last 3 Encounters:   09/24/18 121.7 kg (268 lb 3.2 oz)   09/15/18 122.5 kg (270 lb)   09/04/18 122.5 kg (270 lb)     General: Well-developed male, in no acute distress.  Lungs: Breathing comfortably at rest.  Lungs CTAB without adventitious sounds.  Cardiovascular: RRR no m/r/g.  Extremities warm and dry to touch. No carotid bruits.   Abdomen: Non-distended.  Normoactive bowel sounds.  Soft, non-tender.  Extremities: No edema.  Neurologic: Fully interactive and cooperative with exam.  Able to provide an accurate history.  Speech and mood appropriate.      ASSESSMENT and Plan:     52 y.o. year old male with HTN, HLD, DM, presenting for follow up.  Plan by problem below.    1. Diabetes mellitus   - Continue current doses of long-acting insulin, metformin, jardiance, glipizide.  - Update microalbumin and A1c before next visit in ~3 months since last A1c was not  controlled on labs.   2. Need for prophylactic vaccination and inoculation against influenza - given today in left deltoid.    3. Essential hypertension - Controlled.    - For renal protection in light of DM, stop hctz and start losartan 25 mg daily.  Update microalbumin as above and BMP before next appointment in 2-3 months.  He had cough with ACEi therapy in the past.     Krystal Eaton, MD 09/24/2018 10:17 AM

## 2018-09-24 NOTE — Patient Instructions (Signed)
Stop the hydrochlorothiazide  Start the losartan  Follow up in three months with blood and urine testing done beforehand.

## 2018-11-08 ENCOUNTER — Other Ambulatory Visit: Payer: Self-pay | Admitting: Primary Care

## 2018-11-08 DIAGNOSIS — E119 Type 2 diabetes mellitus without complications: Secondary | ICD-10-CM

## 2018-11-10 NOTE — Telephone Encounter (Signed)
LV   09/24/18     NV  12/22/18

## 2018-11-20 ENCOUNTER — Encounter: Payer: Self-pay | Admitting: Gastroenterology

## 2018-11-20 LAB — HM DIABETES EYE EXAM

## 2018-12-01 ENCOUNTER — Telehealth: Payer: Self-pay | Admitting: Primary Care

## 2018-12-01 NOTE — Telephone Encounter (Signed)
Left message reminding patient of upcoming appointment with Krystal Eaton, MD on 12/22/2018 and to do fasting labs and urine test prior to appt.

## 2018-12-06 ENCOUNTER — Other Ambulatory Visit: Payer: Self-pay | Admitting: Primary Care

## 2018-12-08 NOTE — Telephone Encounter (Signed)
Last seen 09-24-18 and next apt 12-22-18. A1c and CMP 8/19 and A1c ordered.

## 2018-12-17 ENCOUNTER — Other Ambulatory Visit
Admission: RE | Admit: 2018-12-17 | Discharge: 2018-12-17 | Disposition: A | Discharge status: Home / Self Care | Payer: No Typology Code available for payment source | Source: Ambulatory Visit | Attending: Primary Care | Admitting: Primary Care

## 2018-12-17 DIAGNOSIS — E119 Type 2 diabetes mellitus without complications: Secondary | ICD-10-CM | POA: Insufficient documentation

## 2018-12-17 LAB — MICROALBUMIN, URINE, RANDOM
Creatinine,UR: 100 mg/dL (ref 20–300)
Microalb/Creat Ratio: 27.7 mg MA/g CR (ref 0.0–29.9)
Microalbumin,UR: 2.77 mg/dL

## 2018-12-18 LAB — HEMOGLOBIN A1C: Hemoglobin A1C: 6.8 % — ABNORMAL HIGH

## 2018-12-22 ENCOUNTER — Ambulatory Visit: Payer: No Typology Code available for payment source | Attending: Primary Care | Admitting: Primary Care

## 2018-12-22 VITALS — BP 142/84 | HR 68 | Ht 74.0 in | Wt 282.2 lb

## 2018-12-22 DIAGNOSIS — I10 Essential (primary) hypertension: Secondary | ICD-10-CM

## 2018-12-22 DIAGNOSIS — E119 Type 2 diabetes mellitus without complications: Secondary | ICD-10-CM

## 2018-12-22 LAB — HM DIABETES FOOT EXAM

## 2018-12-22 NOTE — Progress Notes (Signed)
[facility]  Outpatient Visit Note       Reason For Visit:   Chief Complaint   Patient presents with    Follow-up     DM       Subjective:      Noah Craig is a 52 y.o. year old man with a history of HTN, HLD, DM, presenting for follow up.  Issues discussed below.    DM:  Controlled at 6.8.  Has been off the glargine for about 2 months.  Otherwise taking metformin 1,000 mg BID, jardiance 10 mg daily, glipizide 10 mg BID.  Denies hypoglycemia events unless he goes a prolonged time without eating which isn't very often.      HTN:   Continues on losartan 25 mg daily.  Takes his dose consistently, usually in the morning time.  No lightheadedness, CP, or LE edema.  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  He does take naproxen 220 mg daily most days of the week but would be willing to reduce how often he is taking it.        Home Medications:     Prior to Admission medications    Medication Sig Start Date End Date Taking? Authorizing Provider   JARDIANCE 10 MG tablet TAKE 1 TABLET BY MOUTH EVERY MORNING 12/08/18  Yes Krystal Eaton, MD   losartan (COZAAR) 25 MG tablet Take 1 tablet (25 mg total) by mouth daily 09/24/18  Yes Krystal Eaton, MD   metFORMIN (GLUCOPHAGE-XR) 500 MG 24 hr tablet TAKE 2 TABLETS BY MOUTH TWO TIMES DAILY (AFTER MEALS) -SWALLOW WHOLE, DO NOT CRUSH, BREAK, OR CHEW 07/29/18  Yes Krystal Eaton, MD   glipiZIDE (GLUCOTROL) 10 MG tablet TAKE 1 TABLET BY MOUTH TWO TIMES DAILY BEFORE MEALS 07/29/18  Yes Krystal Eaton, MD   naproxen sodium (ANAPROX) 220 MG tablet Take 220 mg by mouth 2 times daily (with meals)   Pt takes as needed   Yes [provider]   Continuous Blood Gluc Sensor (FREESTYLE LIBRE 14 DAY SENSOR) MISC APPLY SENSOR TO THE BACK OF UPPER ARM, REPLACE SENSOR EVERY 14 DAYS 11/11/18   Tamera Reason, MD   B-D ULTRAFINE III SHORT PEN 31G X 8 MM USE ONCE DAILY AS DIRECTED 01/01/18   Krystal Eaton, MD   continuous  blood glucose monitor (FREESTYLE LIBRE) reader Use with Freestyle Libre CGM sensor 11/28/17   Krystal Eaton, MD   insulin glargine (LANTUS SOLOSTAR) 100 unit/mL injection pen Inject 35 Units into the skin nightly  Patient not taking: Reported on 12/22/2018 09/18/16   Talmage Nap, MD       Allergies:     Allergies   Allergen Reactions    Lisinopril Cough    Victoza [Liraglutide] Other (See Comments)     GI upset    No Known Latex Allergy      Created by Conversion - 0;      Review of Systems:     As detailed above by problem.     Physical Exam:  Temp Readings from Last 3 Encounters:   06/03/18 36.3 C (97.3 F) (Temporal)   12/18/17 36.3 C (97.3 F) (Temporal)   08/07/17 36.6 C (97.9 F) (Temporal)     BP Readings from Last 3 Encounters:   12/22/18 142/84   09/24/18 112/70   09/04/18 130/72     Pulse Readings from Last 3 Encounters:   12/22/18 68   09/24/18 68  06/03/18 80         Vitals:    12/22/18 1544   BP: 142/84   BP Location: Left arm   Patient Position: Sitting   Cuff Size: adult   Pulse: 68   Weight: 128 kg (282 lb 3.2 oz)   Height: 1.88 m (6\' 2" )     Wt Readings from Last 3 Encounters:   12/22/18 128 kg (282 lb 3.2 oz)   09/24/18 121.7 kg (268 lb 3.2 oz)   09/15/18 122.5 kg (270 lb)     General: Well-developed male, in no acute distress.  Lungs: Breathing comfortably at rest.  Lungs CTAB without adventitious sounds.  Cardiovascular: RRR no m/r/g.  Extremities warm and dry to touch.   Extremities: No edema, no rashes.  Diabetic foot exam: Readily palpable DP and PT pulses bilaterally.  Skin intact without ulceration. 10/10 monofilament sites tested intact bilaterally.     Neurologic: Fully interactive and cooperative with exam.  Able to provide an accurate history.  Speech and mood appropriate.      ASSESSMENT and Plan:     52 y.o. year old male with a history of HTN, HLD, DM, presenting for follow up.  Plan by problem below.    1. Essential hypertension   - Above goal today.    - He would like to  avoid additional agents at this time. So we will have him move his losartan 25 mg to night time to see if that provides better effect, and reduce frequency of naproxen during the week.  BP check in 1-2 months.     2. Type 2 diabetes mellitus   - Controlled.  Continue current doses of metformin, glipizide, jardiance.           Krystal Eaton, MD 12/22/2018 3:48 PM

## 2018-12-22 NOTE — Patient Instructions (Signed)
Ask your insurance to see if there is a significant difference in price of the shingles vaccine (called shingrix) by getting it at the pharmacy versus at our office.  Feel free to call our office to get scheduled for the first dose if your shingles vaccines if you would like to get it here.  The series is two doses, anywhere from 2-6 months between doses.

## 2019-01-25 ENCOUNTER — Other Ambulatory Visit: Payer: Self-pay | Admitting: Primary Care

## 2019-01-26 NOTE — Telephone Encounter (Signed)
Last seen 12-22-18 and next apt 02-24-19. A1c 12/19, CMP 8/19 and both ordered.

## 2019-02-01 ENCOUNTER — Other Ambulatory Visit: Payer: No Typology Code available for payment source | Admitting: Primary Care

## 2019-02-02 ENCOUNTER — Other Ambulatory Visit: Payer: No Typology Code available for payment source | Admitting: Primary Care

## 2019-02-02 DIAGNOSIS — E119 Type 2 diabetes mellitus without complications: Secondary | ICD-10-CM

## 2019-02-02 NOTE — Telephone Encounter (Signed)
Last visit 12/22/18  Next visit 02/24/19  Labs 12/17/18

## 2019-02-02 NOTE — Telephone Encounter (Signed)
Last seen 12-22-18 and next apt 07-06-19. A1c 12/19, CMP 8/19.  Both ordered.

## 2019-02-03 ENCOUNTER — Other Ambulatory Visit: Payer: No Typology Code available for payment source | Admitting: Primary Care

## 2019-02-03 DIAGNOSIS — E119 Type 2 diabetes mellitus without complications: Secondary | ICD-10-CM

## 2019-02-03 NOTE — Telephone Encounter (Signed)
LOV 12/22/18  NOV 07/06/2019  Labs done 12/17/18

## 2019-02-25 ENCOUNTER — Telehealth: Payer: Self-pay | Admitting: Primary Care

## 2019-02-25 ENCOUNTER — Ambulatory Visit: Payer: No Typology Code available for payment source

## 2019-02-25 NOTE — Telephone Encounter (Signed)
That BP looks great - no changes to medications.  Krystal Eaton, MD

## 2019-02-25 NOTE — Telephone Encounter (Signed)
Let patient know bp is good, no med changes at this time

## 2019-02-25 NOTE — Telephone Encounter (Signed)
Patient in for BP check with nurse, was 124/76.

## 2019-03-24 ENCOUNTER — Other Ambulatory Visit: Payer: Self-pay | Admitting: Primary Care

## 2019-03-24 NOTE — Telephone Encounter (Signed)
Last appointment: 12/22/2018   Next appointment: 07/06/19  Labs done 12/17/18

## 2019-05-31 ENCOUNTER — Other Ambulatory Visit: Payer: Self-pay | Admitting: Primary Care

## 2019-06-01 NOTE — Telephone Encounter (Signed)
Last seen 12-22-18 and next apt 07-06-19. A1c 12/19, CMP 8/19, both ordered.

## 2019-06-22 ENCOUNTER — Encounter: Payer: Self-pay | Admitting: Primary Care

## 2019-07-01 ENCOUNTER — Telehealth: Payer: Self-pay | Admitting: Primary Care

## 2019-07-01 NOTE — Telephone Encounter (Signed)
Emailed to [email or web address]   Zoom meeting 07-06-19 at 10:30 AM w/MB    [email or web address]

## 2019-07-02 ENCOUNTER — Other Ambulatory Visit
Admission: RE | Admit: 2019-07-02 | Discharge: 2019-07-02 | Disposition: A | Discharge status: Home / Self Care | Payer: No Typology Code available for payment source | Source: Ambulatory Visit | Attending: Primary Care | Admitting: Primary Care

## 2019-07-02 DIAGNOSIS — E119 Type 2 diabetes mellitus without complications: Secondary | ICD-10-CM

## 2019-07-02 LAB — COMPREHENSIVE METABOLIC PANEL
ALT: 70 U/L — ABNORMAL HIGH (ref 0–50)
AST: 37 U/L (ref 0–50)
Albumin: 4.8 g/dL (ref 3.5–5.2)
Alk Phos: 82 U/L (ref 40–130)
Anion Gap: 11 (ref 7–16)
Bilirubin,Total: 0.7 mg/dL (ref 0.0–1.2)
CO2: 26 mmol/L (ref 20–28)
Calcium: 9.7 mg/dL (ref 8.6–10.2)
Chloride: 105 mmol/L (ref 96–108)
Creatinine: 1.13 mg/dL (ref 0.67–1.17)
GFR,Black: 86 *
GFR,Caucasian: 74 *
Glucose: 179 mg/dL — ABNORMAL HIGH (ref 60–99)
Lab: 18 mg/dL (ref 6–20)
Potassium: 4.8 mmol/L (ref 3.3–5.1)
Sodium: 142 mmol/L (ref 133–145)
Total Protein: 7.1 g/dL (ref 6.3–7.7)

## 2019-07-02 LAB — LIPID PANEL
Chol/HDL Ratio: 2.9
Cholesterol: 102 mg/dL
HDL: 35 mg/dL — ABNORMAL LOW (ref 40–60)
LDL Calculated: 55 mg/dL
Non HDL Cholesterol: 67 mg/dL
Triglycerides: 58 mg/dL

## 2019-07-02 LAB — HEMOGLOBIN A1C: Hemoglobin A1C: 7.4 % — ABNORMAL HIGH

## 2019-07-06 ENCOUNTER — Telehealth: Payer: Self-pay | Admitting: Primary Care

## 2019-07-06 ENCOUNTER — Ambulatory Visit: Payer: No Typology Code available for payment source | Admitting: Primary Care

## 2019-07-06 ENCOUNTER — Encounter: Payer: Self-pay | Admitting: Primary Care

## 2019-07-06 VITALS — Ht 74.0 in | Wt 272.0 lb

## 2019-07-06 DIAGNOSIS — E119 Type 2 diabetes mellitus without complications: Secondary | ICD-10-CM

## 2019-07-06 DIAGNOSIS — I10 Essential (primary) hypertension: Secondary | ICD-10-CM

## 2019-07-06 NOTE — Progress Notes (Signed)
Video Visit     Location of Patient: home    Location of Telemedicine Provider: clinical office    Other participants in telemedicine encounter and roles:  n/a    This is an established patient visit.    Reason for visit: Follow-up (DM - recent labs. )    HPI    DM:  Checking BGs at home, last 24 hours have been in target 79% of the time with avg of ~148.  70% over the past month.  Has been eating lighter breakfasts, which tend to cause much shorter spikes in his number.  Has been more active during the pandemic as well.  Has had a couple of lows in the past 90 days according to his sensor, but he denies any symptoms of such; these were during the daytime.  LDL has been < 70 on most recent labs, without statin therapy.  Had vacation in March and noticing that it is taking some time to get his sugars back down to acceptable range since coming back to the area.      HTN:   Continues on hctz 25 mg daily, losartan 25 mg daily.  No lightheadedness, CP, SOB, or LE edema.  No readings with him today but he has been feeling pretty good overall with regard to his physical activity tolerance and levels.      ROS  As above.    Patient's problem list, allergies, and medications were reviewed and updated as appropriate.  Please see the EHR for full details.    Exam and data reviewed:    Gen: Alert and oriented, NAD.  Pulm: Breathing comfortably at rest without accessory muscle use.  Cardiac: Normal coloration.    Neuro: Interactive, able to provide detailed history, Normal speech and mood.    Assessment & Plan:    1. Diabetes mellitus   - A1c above goal of 7.  He is more interested in continuing his more intensive lifestyle measures since returning from vacation rather than adjusting his medications.  We agreed to repeat A1c with visit in 3 months and if still > 7 (or if BGs aren't continuing to come down on his home BG readings) then next plan is to increase jardiance to 25 mg daily.  Otherwise continue metformin and glipizide  at current doses.  (Removed insulin from medication list as he is not taking that.)   2. Essential hypertension   - Tolerating therapy well.  He will get his BP checked to make sure it is still at goal in his day-to-day activities. Continue losartan and hctz at current doses.      Consent was obtained from the patient to complete this video visit; including the potential for financial liability.    Krystal Eaton, MD

## 2019-07-06 NOTE — Telephone Encounter (Signed)
Pt needs to schedule 3 month follow up visit

## 2019-07-06 NOTE — Patient Instructions (Signed)
Ask your insurance to see if there is a significant difference in price of the shingles vaccine (called shingrix) by getting it at the pharmacy versus at our office.  Feel free to call our office to get scheduled for the first dose of your shingles vaccines if you would like to get it here.  The series is two doses, anywhere from 2-6 months between doses.

## 2019-07-20 ENCOUNTER — Other Ambulatory Visit: Payer: Self-pay | Admitting: Primary Care

## 2019-07-20 NOTE — Telephone Encounter (Signed)
Telemed 07-06-19 and no future apt. A1c, CMP 7/20 and only A1c ordered.

## 2019-07-24 ENCOUNTER — Other Ambulatory Visit: Payer: Self-pay | Admitting: Primary Care

## 2019-07-24 DIAGNOSIS — E119 Type 2 diabetes mellitus without complications: Secondary | ICD-10-CM

## 2019-07-27 NOTE — Telephone Encounter (Signed)
lv    07/06/19     nv   Nothing scheduled

## 2019-08-03 ENCOUNTER — Other Ambulatory Visit: Payer: Self-pay | Admitting: Primary Care

## 2019-08-03 DIAGNOSIS — E119 Type 2 diabetes mellitus without complications: Secondary | ICD-10-CM

## 2019-08-03 NOTE — Telephone Encounter (Signed)
Telemed 07-06-19 and no future apt. Alc, CMP 7/20 and A1c ordered.

## 2019-09-18 ENCOUNTER — Other Ambulatory Visit: Payer: Self-pay | Admitting: Primary Care

## 2019-09-18 NOTE — Telephone Encounter (Signed)
LV    07/06/19     NV   NOTHING SCHEDULED

## 2019-09-24 ENCOUNTER — Other Ambulatory Visit: Payer: Self-pay | Admitting: Primary Care

## 2019-09-24 MED ORDER — GLIPIZIDE 10 MG PO TABS *I*
10.0000 mg | ORAL_TABLET | Freq: Two times a day (BID) | ORAL | 1 refills | Status: DC
Start: 2019-09-24 — End: 2020-03-14

## 2019-09-24 NOTE — Telephone Encounter (Signed)
lv 7.6.20  nv None  Labs done 7.2.20

## 2019-10-01 ENCOUNTER — Encounter: Payer: Self-pay | Admitting: Primary Care

## 2019-10-01 ENCOUNTER — Telehealth: Payer: Self-pay | Admitting: Primary Care

## 2019-10-01 ENCOUNTER — Ambulatory Visit: Payer: No Typology Code available for payment source | Admitting: Primary Care

## 2019-10-01 VITALS — Wt 261.0 lb

## 2019-10-01 DIAGNOSIS — E119 Type 2 diabetes mellitus without complications: Secondary | ICD-10-CM

## 2019-10-01 DIAGNOSIS — R6882 Decreased libido: Secondary | ICD-10-CM

## 2019-10-01 DIAGNOSIS — I10 Essential (primary) hypertension: Secondary | ICD-10-CM

## 2019-10-01 DIAGNOSIS — R5383 Other fatigue: Secondary | ICD-10-CM

## 2019-10-01 MED ORDER — SILDENAFIL CITRATE 20 MG PO TABS *I*
ORAL_TABLET | ORAL | 5 refills | Status: DC
Start: 2019-10-01 — End: 2024-05-24

## 2019-10-01 NOTE — Progress Notes (Signed)
Video Visit     Location of Patient: home    Location of Telemedicine Provider: hospital / clinical location    Other participants in telemedicine encounter and roles:  n/a    This is an established patient visit.  PCP is Dr. [name], but he is out of the office at this time.    Reason for visit: No chief complaint on file.      HPI  Noah Craig has medical history notable for diabetes and hypertension.    His main symptom of complaint today is of decreased interest in things that used to be interesting or pleasurable for him.  Specifically he says that he is always enjoyed trapshooting.  No matter how he is feeling, going to the range would always be enjoyable, but lately he is just not interested in shooting at all.  He also notes that his libido has declined.  Some of it may be due to erectile dysfunction.  He says he has been feeling this decrease of interest and lack of energy for around 2 weeks or so now maybe a little bit more.    His chronic medicines for diabetes are metformin, glipizide and Jardiance.  About 5 or 6 weeks ago he ran out of the glipizide.  Says that he thought he had requested renewal of it, when out of town, it was not ordered he sort of forgot.  So he was off the medicine for 3 or 4 weeks.  But he has been back on it for about 2 weeks now.  While he was off the medicine his blood sugars had gone fairly high, but they are coming back now more into his usual range.  He does not feel like the excursions in blood sugar readings are related to his other symptoms.    He is also additionally noting that his muscles are sore after minimal activity.  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  He has not noticed any decrease in his ability to perform physically with these activities.  No excessive breathlessness or chest pain.    He acknowledges weight  loss.  He is dropped about 10 pounds over the past couple of weeks, not really trying.    Has not noticed any rash or other skin changes.  Denies exertional chest pain or breathlessness.  No abdominal pain, nausea, vomiting, diarrhea.  No change in urination except he did notice that he was urinating more often when his blood sugars were high, as expected.  Regarding sexual function, he says he just does not get erections at all.  Not even nocturnal.  Acknowledges possibility of this being an effective diabetes, also wonders about medication effect.  States that his interest in sexual activity is still there, but may be a little bit impaired because of performance anxiety.  He is more easily fatigued, and tends to fall asleep around suppertime which is also something new for him.  Denies heat or cold intolerance.    Patient's problem list, allergies, and medications were reviewed and updated as appropriate.  Please see the EHR for full details.    Exam and data reviewed:  Awake and alert, appears well on video.    Assessment & Plan:  Symptoms including decreased interest and decreased mood, decreased libido.  Certainly consistent with depression, but symptoms only present for  a few weeks, so cannot really make a diagnosis of major depression disorder at this point.  I do not think antidepressant therapy is indicated just yet.  Given the abrupt onset of the symptoms without obvious precipitant, I think we should be looking at other potential causes which include metabolic, endocrine etc.  Laboratory testing ordered, checking glycemic control, renal function, electrolytes, testosterone, thyroid.  We will look for evidence of myositis with a CK.  With unintended weight loss will also check CBC, SPEP.  We will communicate results to him when they are available, with any further recommended evaluations.    Erectile dysfunction-checking testosterone as noted above.  He would like to try sildenafil, will go with 20 mg  tablets.  He is going to let me know if there is any problems getting these medications.    Invited him to contact the office again for any new concerns or questions about the above.    Consent was obtained from the patient to complete this video visit; including the potential for financial liability.          Penni Bombard, MD

## 2019-10-01 NOTE — Telephone Encounter (Signed)
Emailed to [email or web address] zoom meeting 10-01-19 at 11:15 AM w/CW    [email or web address]

## 2019-10-02 ENCOUNTER — Other Ambulatory Visit
Admission: RE | Admit: 2019-10-02 | Discharge: 2019-10-02 | Disposition: A | Discharge status: Home / Self Care | Payer: No Typology Code available for payment source | Source: Ambulatory Visit | Attending: Clinical Pathology | Admitting: Clinical Pathology

## 2019-10-02 DIAGNOSIS — I10 Essential (primary) hypertension: Secondary | ICD-10-CM | POA: Insufficient documentation

## 2019-10-02 DIAGNOSIS — R6882 Decreased libido: Secondary | ICD-10-CM | POA: Insufficient documentation

## 2019-10-02 DIAGNOSIS — R5383 Other fatigue: Secondary | ICD-10-CM | POA: Insufficient documentation

## 2019-10-02 DIAGNOSIS — E119 Type 2 diabetes mellitus without complications: Secondary | ICD-10-CM

## 2019-10-02 LAB — COMPREHENSIVE METABOLIC PANEL
ALT: 53 U/L — ABNORMAL HIGH (ref 0–50)
AST: 29 U/L (ref 0–50)
Albumin: 4.7 g/dL (ref 3.5–5.2)
Alk Phos: 98 U/L (ref 40–130)
Anion Gap: 14 (ref 7–16)
Bilirubin,Total: 0.5 mg/dL (ref 0.0–1.2)
CO2: 20 mmol/L (ref 20–28)
Calcium: 9.3 mg/dL (ref 8.6–10.2)
Chloride: 107 mmol/L (ref 96–108)
Creatinine: 0.99 mg/dL (ref 0.67–1.17)
GFR,Black: 101 *
GFR,Caucasian: 87 *
Glucose: 209 mg/dL — ABNORMAL HIGH (ref 60–99)
Lab: 15 mg/dL (ref 6–20)
Potassium: 4.5 mmol/L (ref 3.3–5.1)
Sodium: 141 mmol/L (ref 133–145)

## 2019-10-02 LAB — CBC AND DIFFERENTIAL
Baso # K/uL: 0 10*3/uL (ref 0.0–0.1)
Basophil %: 0.3 %
Eos # K/uL: 0.2 10*3/uL (ref 0.0–0.5)
Eosinophil %: 3.1 %
Hematocrit: 50 % (ref 40–51)
Hemoglobin: 17 g/dL (ref 13.7–17.5)
IMM Granulocytes #: 0 10*3/uL (ref 0.0–0.0)
IMM Granulocytes: 0.6 %
Lymph # K/uL: 1.9 10*3/uL (ref 1.3–3.6)
Lymphocyte %: 28.3 %
MCH: 30 pg/cell (ref 26–32)
MCHC: 34 g/dL (ref 32–37)
MCV: 89 fL (ref 79–92)
Mono # K/uL: 0.5 10*3/uL (ref 0.3–0.8)
Monocyte %: 6.9 %
Neut # K/uL: 4.1 10*3/uL (ref 1.8–5.4)
Nucl RBC # K/uL: 0 10*3/uL (ref 0.0–0.0)
Nucl RBC %: 0 /100 WBC (ref 0.0–0.2)
Platelets: 189 10*3/uL (ref 150–330)
RBC: 5.6 MIL/uL (ref 4.6–6.1)
RDW: 12.7 % (ref 11.6–14.4)
Seg Neut %: 60.8 %
WBC: 6.8 10*3/uL (ref 4.2–9.1)

## 2019-10-02 LAB — CK: CK: 55 U/L (ref 39–308)

## 2019-10-02 LAB — SEDIMENTATION RATE, AUTOMATED: Sedimentation Rate: 4 mm/hr (ref 0–20)

## 2019-10-04 LAB — HEMOGLOBIN A1C: Hemoglobin A1C: 7.6 % — ABNORMAL HIGH

## 2019-10-05 ENCOUNTER — Encounter: Payer: Self-pay | Admitting: Primary Care

## 2019-10-05 DIAGNOSIS — Z5321 Procedure and treatment not carried out due to patient leaving prior to being seen by health care provider: Secondary | ICD-10-CM

## 2019-10-05 NOTE — Telephone Encounter (Signed)
(  Waiting for testosterone results)

## 2019-10-06 LAB — PROTEIN ELECTROPHORESIS, SERUM
A/G Ratio: 1.5 (ref 0.9–1.8)
Albumin: 4.2 g/dL (ref 3.5–5.1)
Alpha 1: 0.3 g/dL (ref 0.2–0.4)
Alpha 2: 0.5 g/dL (ref 0.4–0.9)
Beta: 0.8 g/dL (ref 0.5–1.0)
Gamma: 1.2 g/dL (ref 0.7–1.4)
Interp,PE: NORMAL
Total Protein: 7 g/dL (ref 6.3–7.7)

## 2019-10-06 LAB — PE ELECT,REVIEW

## 2019-10-08 ENCOUNTER — Telehealth: Payer: Self-pay | Admitting: Primary Care

## 2019-10-08 DIAGNOSIS — R7989 Other specified abnormal findings of blood chemistry: Secondary | ICD-10-CM

## 2019-10-08 NOTE — Telephone Encounter (Signed)
All of the laboratory testing has now come back in.    Everything is pretty much on target.  His diabetes is pretty well controlled with hemoglobin A1c at 7.6.  It might be nice to have a little bit lower, but that should not be the cause of his symptoms.    His testosterone was in the low end of the normal range.  I cannot say for sure that this is the cause of his symptoms, but it is possible.  We do not make a firm diagnosis of low testosterone based just on one test, however.  My recommendation is that he repeat the testosterone check sometime in the next few weeks, and make an appointment with his PCP to discuss the results.  I will put the order in.  He does not need to fast, but the blood should be drawn early in the morning, between 7 and 8 AM if possible.

## 2019-10-08 NOTE — Telephone Encounter (Signed)
Left message for pt to call the office.

## 2019-10-09 LAB — LC/MS F&T TESTOSTERONE
TESTOSTERONE,FREE: 7.02 ng/dL (ref 4.06–15.6)
Testosterone,LC-MS/MS: 270 ng/dL (ref 240–950)

## 2019-10-09 NOTE — Telephone Encounter (Signed)
All of the laboratory testing has now come back in.    Everything is pretty much on target.  His diabetes is pretty well controlled with hemoglobin A1c at 7.6.  It might be nice to have a little bit lower, but that should not be the cause of his symptoms.    His testosterone was in the low end of the normal range.  I cannot say for sure that this is the cause of his symptoms, but it is possible.  We do not make a firm diagnosis of low testosterone based just on one test, however.  My recommendation is that he repeat the testosterone check sometime in the next few weeks, and make an appointment with his PCP to discuss the results.  I will put the order in.  He does not need to fast, but the blood should be drawn early in the morning, between 7 and 8 AM if possible.    Gave above message to patient on call back, he will get labs done in the next few weeks and call back for an appointment with Dr [name]

## 2019-10-14 NOTE — Telephone Encounter (Signed)
This encounter was created in error - please disregard.

## 2019-10-21 ENCOUNTER — Encounter: Payer: Self-pay | Admitting: Primary Care

## 2019-10-21 ENCOUNTER — Ambulatory Visit: Payer: No Typology Code available for payment source | Admitting: Primary Care

## 2019-10-21 VITALS — BP 140/80 | HR 74 | Temp 97.8°F | Ht 74.0 in | Wt 275.0 lb

## 2019-10-21 DIAGNOSIS — E1100 Type 2 diabetes mellitus with hyperosmolarity without nonketotic hyperglycemic-hyperosmolar coma (NKHHC): Secondary | ICD-10-CM

## 2019-10-21 DIAGNOSIS — S86012A Strain of left Achilles tendon, initial encounter: Secondary | ICD-10-CM

## 2019-10-21 DIAGNOSIS — Z23 Encounter for immunization: Secondary | ICD-10-CM

## 2019-10-21 MED ORDER — FREESTYLE LIBRE READER DEVI *A*
0 refills | Status: DC
Start: 2019-10-21 — End: 2020-12-01

## 2019-10-21 NOTE — Progress Notes (Signed)
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  He did not twist his ankle.  The pain has increased overnight, and affects the lower portion of his Achilles tendon.  Patient has a prior history of right knee replacement.  He is able to support weight on his left foot provided a wrist flap.  He is unable to stand on his tiptoes, using his left foot for support.    Medications:  Current Outpatient Medications   Medication Sig    continuous blood glucose monitor (FREESTYLE LIBRE) reader Use with Freestyle Libre CGM sensor    sildenafil (REVATIO) 20 MG tablet 2-5 tab daily prn, taken 30-60 minutes before sexual activity    glipiZIDE (GLUCOTROL) 10 MG tablet Take 1 tablet (10 mg total) by mouth 2 times daily (before meals)    losartan (COZAAR) 25 MG tablet TAKE 1 TABLET BY MOUTH EVERY DAY    Continuous Blood Gluc Sensor (FREESTYLE LIBRE 14 DAY SENSOR) MISC APPLY SENSOR TO THE BACK OF UPPER ARM, REPLACE SENSOR EVERY 14 DAYS    metFORMIN (GLUCOPHAGE-XR) 500 MG 24 hr tablet TAKE 2 TABLETS BY MOUTH TWO TIMES DAILY (AFTER MEALS), SWALLOW WHOLE, DO NOT CRUSH, BREAK OR CHEW    JARDIANCE 10 MG tablet TAKE 1 TABLET BY MOUTH EVERY MORNING    B-D ULTRAFINE III SHORT PEN 31G X 8 MM USE ONCE DAILY AS DIRECTED    continuous blood glucose monitor (FREESTYLE LIBRE) reader Use with Freestyle Libre CGM sensor    naproxen sodium (ANAPROX) 220 MG tablet Take 220 mg by mouth 2 times daily (with meals)   Pt takes as needed     No current facility-administered medications for this visit.        Physical Exam:  Vitals:    10/21/19 1256   BP: 140/80   BP Location: Right arm   Patient Position: Sitting   Cuff Size: adult   Pulse: 74   Temp: 36.6 C (97.8 F)   TempSrc: Temporal   SpO2: 97%   Weight: 124.7 kg (275 lb)   Height: 1.88 m (6\' 2" )     Body mass index is 35.31 kg/m.    As stated, he is unable to support weight on his left  foot unless it is completely flat on the ground.  He cannot stand on his tiptoes.  There is excruciating pain over the distal Achilles tendon and the posterior part of the calcaneus.    Labs:  No results found for this or any previous visit (from the past 336 hour(s)).       Assessment/Plan:  1. Probable partial tear of the Achilles tendon.  Less likely is a calcaneal bursitis.  The sudden appearance of pain when he stepped out of his truck most likely cause.  2. He was referred to [facility], for an urgent consultation.  In the interim he was advised to practice 0 weightbearing on the left foot, and use crutches for the purpose.  3. Given the flu vaccine today.    Follow up:    No follow-ups on file.

## 2019-10-22 ENCOUNTER — Telehealth: Payer: Self-pay | Admitting: Primary Care

## 2019-10-22 DIAGNOSIS — E1100 Type 2 diabetes mellitus with hyperosmolarity without nonketotic hyperglycemic-hyperosmolar coma (NKHHC): Secondary | ICD-10-CM

## 2019-10-22 DIAGNOSIS — E119 Type 2 diabetes mellitus without complications: Secondary | ICD-10-CM

## 2019-10-22 MED ORDER — FREESTYLE LIBRE READER DEVI *A*
0 refills | Status: DC
Start: 2019-10-22 — End: 2020-10-19

## 2019-10-22 MED ORDER — FREESTYLE LIBRE 14 DAY SENSOR MISC *A*
1.0000 | Freq: Four times a day (QID) | 5 refills | Status: DC
Start: 2019-10-22 — End: 2020-06-27

## 2019-10-22 NOTE — Telephone Encounter (Signed)
Ordered.  Krystal Eaton, MD

## 2019-10-22 NOTE — Telephone Encounter (Signed)
Please resend prescription for Freestyle Libre to [facility] in [place].

## 2019-10-23 ENCOUNTER — Encounter: Payer: Self-pay | Admitting: Orthopedic Surgery

## 2019-10-23 ENCOUNTER — Ambulatory Visit: Payer: No Typology Code available for payment source | Admitting: Orthopedic Surgery

## 2019-10-23 ENCOUNTER — Ambulatory Visit
Admission: RE | Admit: 2019-10-23 | Discharge: 2019-10-23 | Disposition: A | Discharge status: Home / Self Care | Payer: No Typology Code available for payment source | Source: Ambulatory Visit | Attending: Orthopedic Surgery | Admitting: Orthopedic Surgery

## 2019-10-23 VITALS — Ht 74.0 in | Wt 275.0 lb

## 2019-10-23 DIAGNOSIS — M722 Plantar fascial fibromatosis: Secondary | ICD-10-CM | POA: Insufficient documentation

## 2019-10-23 DIAGNOSIS — M7732 Calcaneal spur, left foot: Secondary | ICD-10-CM

## 2019-10-23 DIAGNOSIS — M67972 Unspecified disorder of synovium and tendon, left ankle and foot: Secondary | ICD-10-CM | POA: Insufficient documentation

## 2019-10-23 DIAGNOSIS — M7662 Achilles tendinitis, left leg: Secondary | ICD-10-CM

## 2019-10-23 DIAGNOSIS — M21862 Other specified acquired deformities of left lower leg: Secondary | ICD-10-CM

## 2019-10-23 DIAGNOSIS — M216X2 Other acquired deformities of left foot: Secondary | ICD-10-CM | POA: Insufficient documentation

## 2019-10-23 NOTE — Progress Notes (Signed)
CHIEF COMPLAINT:  Left heel pain    History of present illness:   Noah Craig is a 53 y.o. male, presents today with pain in his left heel, since Tuesday.  He reports that he stepped out of his car and after the first initial stepped he heard a pop in his left lower leg.  He has had pain and tenderness over the distal aspect of his left Achilles since, has been weightbearing with some discomfort.  He reports there was swelling in his left lower.  He has decreased activity, taking Aleve for the pain.  He states that this happened in his right foot previously and was treated nonoperatively with walking boot.  He states that his right lower leg is not causing any pain or discomfort at the time.  he denies fevers, chills, shortness of breath.    Has history being treated for the right Achilles insertional tendinitis in 2019.    Pain    10/23/19 0947   PainSc:   0 - No pain       Vitals:    10/23/19 0947   BP: (P) 143/83   Pulse: (P) 63   Weight: 124.7 kg (275 lb)   Height: 1.88 m (6\' 2" )         REVIEW OF SYSTEMS  Constitutional: No fever, chills, night sweats  HENT: Negative for sore throat.   Musculoskeletal: Left lower leg pain, heel pain  All other systems reviewed and are negative.    PHYSICAL EXAM  Patient is awake alert and fully oriented. Patient is pleasant and cooperative. Patient is well-developed and well-nourished. Patient is in no apparent distress.     Body mass index is 35.31 kg/m.      Musculoskeletal:    Negative Alden squeeze test left.  Mild swelling.  Pain to palpation over insertional Achilles.  Good range of motion.  Gastroc contracture.      IMAGING STUDIES  Radiographs personally reviewed with patient in the room.  No acute findings.  Calcaneus spur present left      ASSESSMENT   1. Achilles tendinitis, left  2. Gastroc contracture, left  3. Plantar fasciitis, left        PLAN  We discussed the above physical exam findings, imaging studies results, assessment and treatment plan with the  patient.    1.  Nonoperative treatment for insertional Achilles tendinitis.  2.  Continue taking Aleve as necessary for decrease pain, inflammation  3.  Heel lifts for shoes, bilateral  4.  Spenco inserts  5.  Activity modification  6.  Educated on tendon Achilles stretching , plantar fascia stretching.  7 follow-up in 6 weeks.   8 encouraged to reach out with any questions complaints concerns.     Skeet Latch, NP        I saw and evaluated the patient.  Images reviewed.  I agree with the resident's/fellow's/PA's findings and plan of care as documented above.    Patient is a 53 year old male who presents with left heel posterior aspect painful swelling which started on October 20, 2019.  He felt the sudden onset of a painful pop on the posterior aspect of the left heel when he stepped out of the car.  On exam erythema swelling is noted on the posterior aspect of the heel with a bony prominence.  Tenderness and hypersensitivity to touch is noted on the painful bony lesion.  Silfverskiold positive sign noted with tenderness on the distal aspect of the Achilles tendon.  Radiograph shows a large bony prominence of the calcaneus tuber with a posterior spur formation.  Signs are most consistent with an acute inflammation of the left heel insertional Achilles tendinitis.  Recommended initial conservative treatment with heel lift, orthotics, and NSAIDs.  May initiate Achilles stretching when symptoms subsides.  Surgical treatment options also discussed.  Given that the symptoms started recently, we will try nonoperative treatment first.    Delia Chimes, MD 12:03 PM 10/24/2019

## 2019-10-29 ENCOUNTER — Other Ambulatory Visit
Admission: RE | Admit: 2019-10-29 | Discharge: 2019-10-29 | Disposition: A | Discharge status: Home / Self Care | Payer: No Typology Code available for payment source | Source: Ambulatory Visit | Attending: Primary Care | Admitting: Primary Care

## 2019-10-29 ENCOUNTER — Telehealth: Payer: Self-pay | Admitting: Primary Care

## 2019-10-29 DIAGNOSIS — N390 Urinary tract infection, site not specified: Secondary | ICD-10-CM

## 2019-10-29 LAB — URINALYSIS REFLEX TO CULTURE
Blood,UA: NEGATIVE
Ketones, UA: NEGATIVE
Leuk Esterase,UA: NEGATIVE
Nitrite,UA: NEGATIVE
Protein,UA: NEGATIVE mg/dL
Specific Gravity,UA: 1.03 (ref 1.002–1.030)
pH,UA: 5.5 (ref 5.0–8.0)

## 2019-10-29 NOTE — Telephone Encounter (Signed)
Let patient know, he will go and leave sample today after 2, uses [place] for prescriptions

## 2019-10-29 NOTE — Telephone Encounter (Signed)
Patient called with possible UTI has pain in his back and frequent urination.      He is willing to take a sample to a [facility] if ordered.  Not sure where to put him, I let him know we would call back if you can place this order.  Please let me know.

## 2019-10-29 NOTE — Telephone Encounter (Signed)
Let's start with a UA first; ordered - if consistent with infection then will order abx.    Krystal Eaton, MD

## 2019-10-30 ENCOUNTER — Encounter: Payer: Self-pay | Admitting: Gastroenterology

## 2019-10-30 ENCOUNTER — Telehealth: Payer: Self-pay | Admitting: Primary Care

## 2019-10-30 NOTE — Telephone Encounter (Signed)
Called and spoke with patient.  He wonders if he had/passed a kidney stone.  He said the pain has dwindled away.  He has had some high numbers but were expected with things he ate - carb related.  He had a six inch sub today and before lunch he was 130 and shot up to 350 after lunch. He said he does seem to be jumping higher when eating carbs compared to before.

## 2019-10-30 NOTE — Telephone Encounter (Signed)
Spoke with patient - reviewed message below.

## 2019-10-30 NOTE — Telephone Encounter (Signed)
-----   Message from Krystal Eaton, MD sent at 10/30/2019  4:12 PM EDT -----  Please let patient know there weren't signs of infection in the urine sample, though there is excess glucose.  Let us know if he's been having excessively high numbers.

## 2019-10-30 NOTE — Telephone Encounter (Signed)
Ok, I would continue to monitor symptoms at this point and let us know if they recur.  Nothing to change at this time except some extra diligence about avoiding processed carbohydrates.     Krystal Eaton, MD

## 2019-12-03 ENCOUNTER — Other Ambulatory Visit: Payer: Self-pay | Admitting: Primary Care

## 2019-12-03 NOTE — Telephone Encounter (Signed)
LV  07/06/19    NV  Nothing scheduled     Labs  07/02/19

## 2019-12-04 ENCOUNTER — Ambulatory Visit: Payer: No Typology Code available for payment source | Admitting: Orthopedic Surgery

## 2019-12-04 ENCOUNTER — Encounter: Payer: Self-pay | Admitting: Orthopedic Surgery

## 2019-12-04 VITALS — BP 160/88 | HR 71 | Ht 74.0 in | Wt 270.0 lb

## 2019-12-04 DIAGNOSIS — M7662 Achilles tendinitis, left leg: Secondary | ICD-10-CM

## 2019-12-04 NOTE — Progress Notes (Signed)
CHIEF COMPLAINT:  Left heel pain    History of present illness:   Noah Craig is a 53 y.o. male, who is returning for follow up evaluation and treatment.    Patient has been treated under impression of     1. Achilles tendinitis, left  2. Gastroc contracture, left  3. Plantar fasciitis, left    Patient tried NSAIDs, Achilles stretching, heel lift, Spenco, and activity modification which initially provided significant relief of pain.  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  The blister healed with activity modification patient denies any pain on the posterior aspect of the heel. However he is concerned about  a potential recurrent pain or blister on the left heel posterior aspect.    Pain    12/04/19 0922   PainSc:   0 - No pain   PainLoc: Foot       Vitals:    12/04/19 0922   BP: 160/88   Pulse: 71   Weight: 122.5 kg (270 lb)   Height: 1.88 m (6\' 2" )         REVIEW OF SYSTEMS  Constitutional: No fever, chills, night sweats  HENT: Negative for sore throat.   Musculoskeletal: Left heel pain  All other systems reviewed and are negative.    PHYSICAL EXAM  Patient is awake alert and fully oriented. Patient is pleasant and cooperative. Patient is well-developed and well-nourished. Patient is in no apparent distress.     Body mass index is 34.67 kg/m.      Musculoskeletal: Left foot and ankle focused  Upon standing shows neutral alignment of bilateral lower extremities.   A bony prominence noted in the posterior aspect of the heel corresponding with the calcaneus tuber.  Mild to of swelling and erythema noted in the adjacent skin and soft tissue.    No significant tenderness noted in the Achilles in the region.  Ankle dorsiflexion motion is limited 0 degree with knee extension, improved to 10 degrees with knee flexion.  Silfverskiold positive sign noted.  Distal neurovascular intact.      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      ASSESSMENT   1.  Achilles tendinitis, left  2.  Gastrocnemius contracture, left  3.  Plantar fasciitis, left      PLAN  We discussed the above physical exam findings, imaging studies results, assessment and treatment plan with the patient.    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  He has no complaint of pain in the posterior aspect of the heel.  However he developed a blister  after wearing sneakers.  The minimal pain, recommended to pursue further conservative management for now.  Discussed about conservative versus surgical treatment options.  Given the minimal pain, recommended to continue with conservative treatment for now.  Invited to contact office in case he wishes to proceed with surgical intervention.     1.  Gel heel sleeve provided in the office. Discussed about Achillotrain option.   2.  Surgical intervention option discussed: If pain persists, will consider proceeding with surgical intervention; Haglund excision and Achilles debridement with possible gastrocnemius recession.

## 2019-12-22 ENCOUNTER — Telehealth: Payer: Self-pay | Admitting: Primary Care

## 2019-12-22 ENCOUNTER — Ambulatory Visit
Admission: AD | Admit: 2019-12-22 | Discharge: 2019-12-22 | Disposition: A | Discharge status: Home / Self Care | Payer: No Typology Code available for payment source | Source: Ambulatory Visit | Attending: Emergency Medicine | Admitting: Emergency Medicine

## 2019-12-22 DIAGNOSIS — U071 COVID-19: Secondary | ICD-10-CM | POA: Insufficient documentation

## 2019-12-22 DIAGNOSIS — B349 Viral infection, unspecified: Secondary | ICD-10-CM

## 2019-12-22 NOTE — ED Triage Notes (Signed)
Patient presenting to Urgent Care for testing only. COVID-19 test ordered by outside provider     Does the patient currently have symptoms concerning for COVID-19?: Yes   Nasopharyngeal  swab obtained and sent for analysis.  Patient was offered a medical evaluation with an Urgent Care provider and has declined.       Triage Note    [place], LPN

## 2019-12-22 NOTE — Telephone Encounter (Signed)
Patient complaining of body aches, shoulders, arms and up.  Headache, no fever today, light cough, no sore throat.  Sinus pain and pressure.      Asking for covid testing.

## 2019-12-22 NOTE — Telephone Encounter (Signed)
Spoke with patient - reviewed message below.

## 2019-12-22 NOTE — Telephone Encounter (Signed)
Reviewed - agree with testing for covid.  Must isolate until results return.    Krystal Eaton, MD

## 2019-12-23 ENCOUNTER — Telehealth: Payer: Self-pay | Admitting: Primary Care

## 2019-12-23 LAB — COVID-19 NAAT (PCR): COVID-19 NAAT (PCR): POSITIVE — AB

## 2019-12-23 LAB — COVID-19 PCR

## 2019-12-23 NOTE — Telephone Encounter (Signed)
I spoke to the patient and advised him of the following Please let patient know that his covid test came back positive. [facility] should be contacting him in the near future. Needs to isolate (see below). If he would like to get plugged in with the get well network that's an option.      Isolation for symptoms related to COVID-19:   You must remain in strict isolation for 10 days from the day of your first symptoms. After a full 10 days of isolation, you may resume usual activities if both of these conditions are also true:   First, you must be free of fever for at least 24 hours and not using medicines to reduce fever. These medications are acetaminophen/Tylenol; ibuprofen/Motrin/Advil; naproxen/Naprosyn.   Also, all of your other symptoms must be at least improving.   Contact our office right away if anything gets worse.     Isolation involves:     Having a separate living space, if available, in the home. Meaning separate bedroom, separate bathroom. Not going out into commonly used spaces of the home unless absolutely necessary. If the person in isolation leaves their isolation living space, then everyone in the household needs to be masked. Thorough cleaning needs to be done after the person goes back to their isolation space. If sharing a bathroom space, toothbrushes and other personal hygiene products should be kept separate. Thorough cleaning needs to be done regularly, and thorough handwashing after leaving the bathroom.     If separate living space is not available, then the person in isolation needs to be kept well away from every other household member, with special attention to cleaning, hygiene and distancing.

## 2019-12-23 NOTE — Telephone Encounter (Signed)
-----   Message from Krystal Eaton, MD sent at 12/23/2019  2:58 PM EST -----  Please let patient know that his covid test came back positive.  [facility] should be contacting him in the near future.  Needs to isolate (see below).  If he would like to get plugged in with the get well network that's an option.      Isolation for symptoms related to COVID-19:  You must remain in strict isolation for 10 days from the day of your first symptoms. After a full 10 days of isolation, you may resume usual activities if both of these conditions are also true:  First, you must be free of fever for at least 24 hours and not using medicines to reduce fever. These medications are acetaminophen/Tylenol; ibuprofen/Motrin/Advil; naproxen/Naprosyn.  Also, all of your other symptoms must be at least improving.  Contact our office right away if anything gets worse.    Isolation involves:    Having a separate living space, if available, in the home. Meaning separate bedroom, separate bathroom. Not going out into commonly used spaces of the home unless absolutely necessary. If the person in isolation leaves their isolation living space, then everyone in the household needs to be masked. Thorough cleaning needs to be done after the person goes back to their isolation space. If sharing a bathroom space, toothbrushes and other personal hygiene products should be kept separate. Thorough cleaning needs to be done regularly, and thorough handwashing after leaving the bathroom.    If separate living space is not available, then the person in isolation needs to be kept well away from every other household member, with special attention to cleaning, hygiene and distancing.

## 2019-12-24 NOTE — Telephone Encounter (Signed)
Pt. Reports body aches, H/A, fever have resolved.  Continues with fatigue only.  Denies any CP, SOB.  He is aware to call with any concerning sx and is agreeable to [facility].      Based on patient symptoms and positive or presumed positive COVID status  please enroll patient in [facility] loop    ALL FOUR QUESTIONS BELOW MUST BE ADDRESSED BY STAFF IN FULL FOR SUCCESSFUL [facility] ENROLLMENT    Click F2 On Your Keyboard to Start Answering the Questions    1. Is patient an employee of [facility]?   No    o If YES - STOP, refer patient to employee health for enrollment    2. A conversation with patient regarding [facility] must happen before referral and enrollment.    o Have you discussed [facility] with patient?    yes    3. Patient can either be enrolled with text or email alerts?   Cellphone: [phone]    Please provide the platient's cell phone number or e-mail address:    Cell Number:Cellphone: [phone]  OR  Email:    4.   What is the practice name (e.g. [facility], [facility], etc) that the patient is being referred from? [facility]     Route this message to P [facility]

## 2019-12-24 NOTE — Telephone Encounter (Signed)
Patient's information was inputted and submitted into the [facility] portal on 12/24/19 by Louana Fontenot A Olie Dibert

## 2019-12-28 ENCOUNTER — Telehealth: Payer: Self-pay | Admitting: Primary Care

## 2019-12-28 NOTE — Telephone Encounter (Signed)
Patient has been diagnosed with Covid 19, his wife bought a oxygen sensor and just sitting it is registering 90 -91 .    He is finding he is very sob the last 2 days which is why they purchased the sensor.     He is exhausted after getting up, showering and just moving around.

## 2019-12-28 NOTE — Telephone Encounter (Signed)
Let patient know guidelines he states he is staying in the 94 level most of the day.  Told him to watch once he is more active and report to [place] or go to ED if 88 or below, especially if not active. He understands and will let us know

## 2019-12-28 NOTE — Telephone Encounter (Signed)
Would have patient see what the oxygen numbers do while getting up and moving around, going up/down stairs for example.  If that drops below 88% then would need emergency evaluation.  Otherwise if the oxygen numbers at rest get below 90% without activity, or symptoms continue to worsen over the next 24-48 hours then also needs to go to ED.      Krystal Eaton, MD

## 2020-01-04 IMAGING — DX DG CHEST 2V
2 series · 2 of 2 positions shown · non-contrast
Comparison: None.

CLINICAL DATA: Chest pain today

EXAM:
CHEST - 2 VIEW

[chest pa]
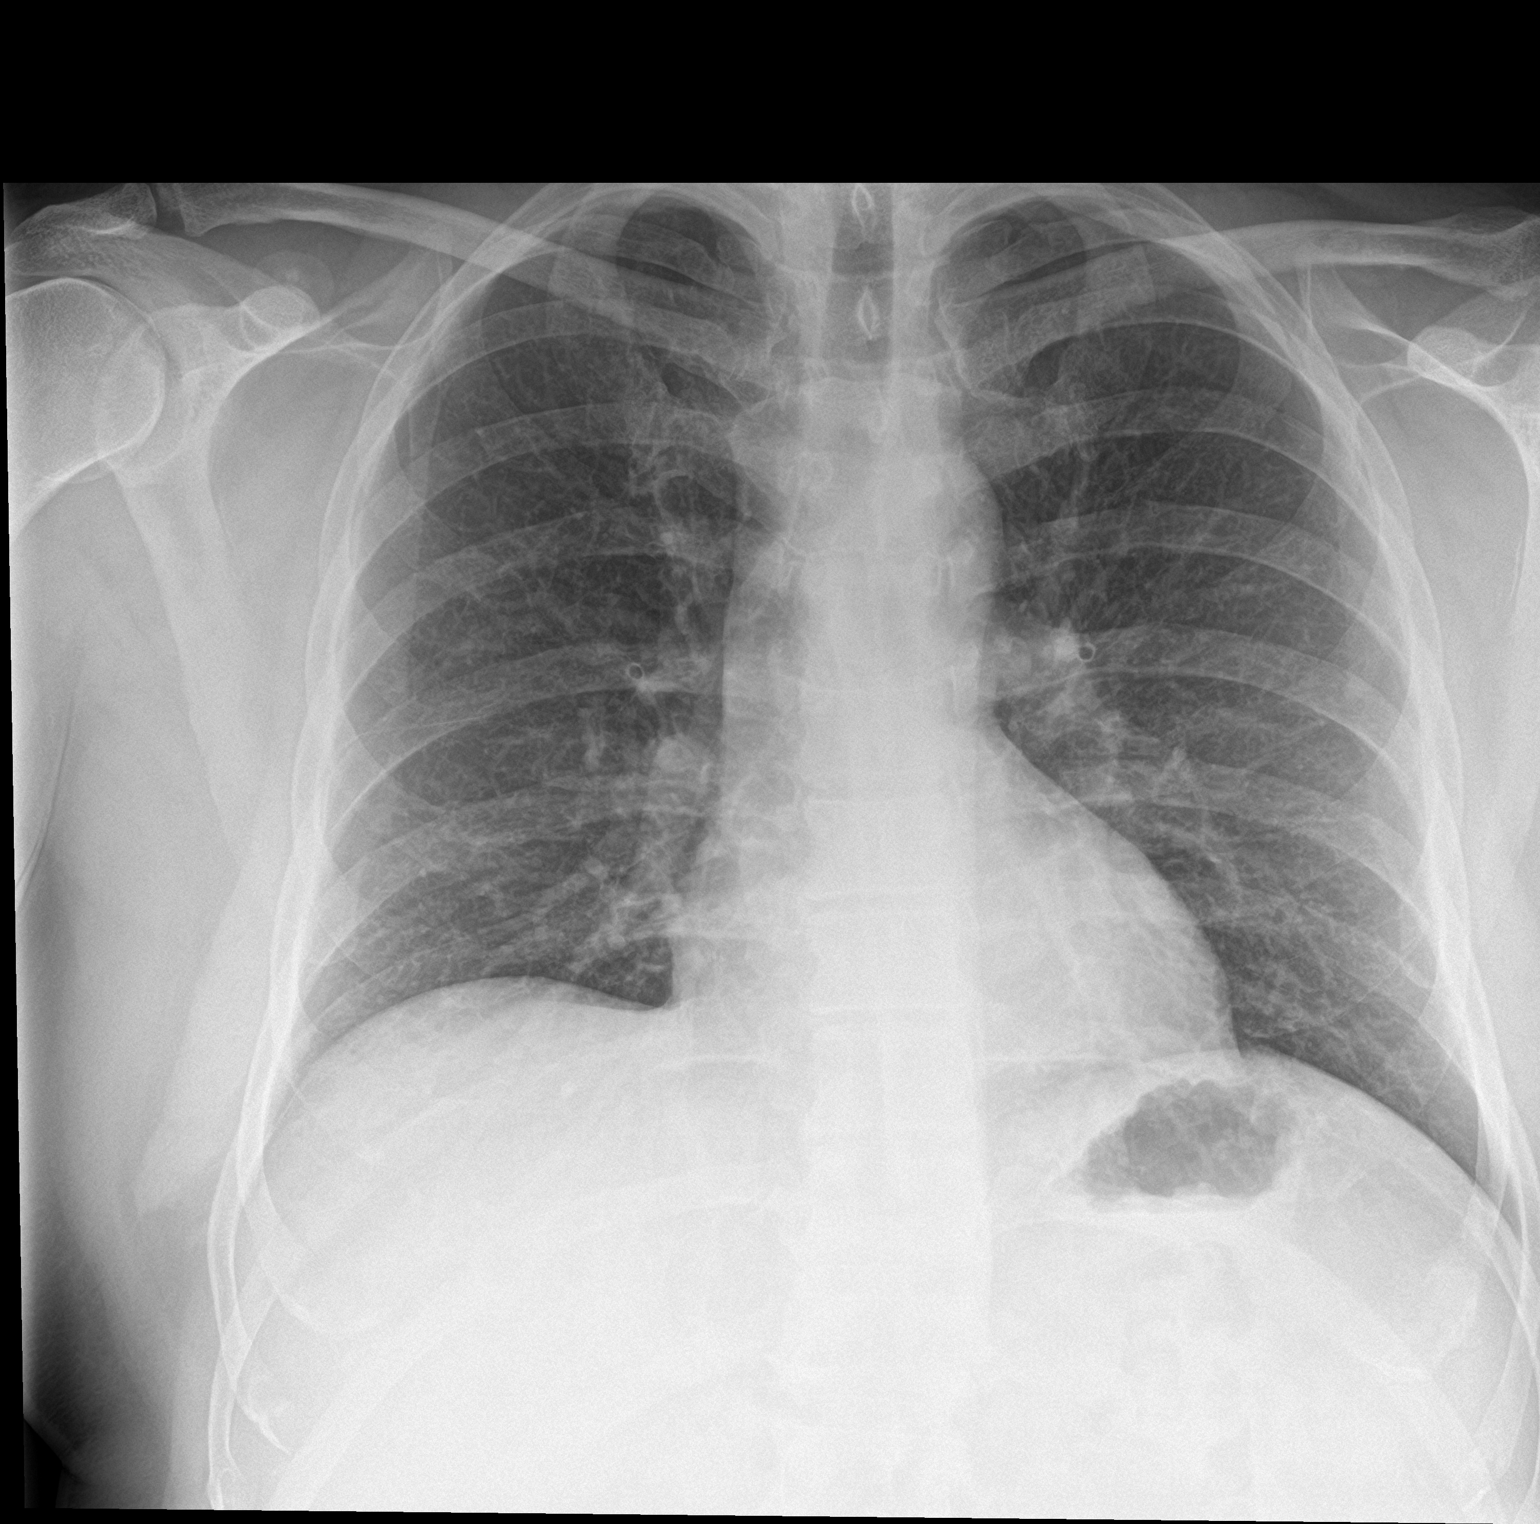

[chest lat]
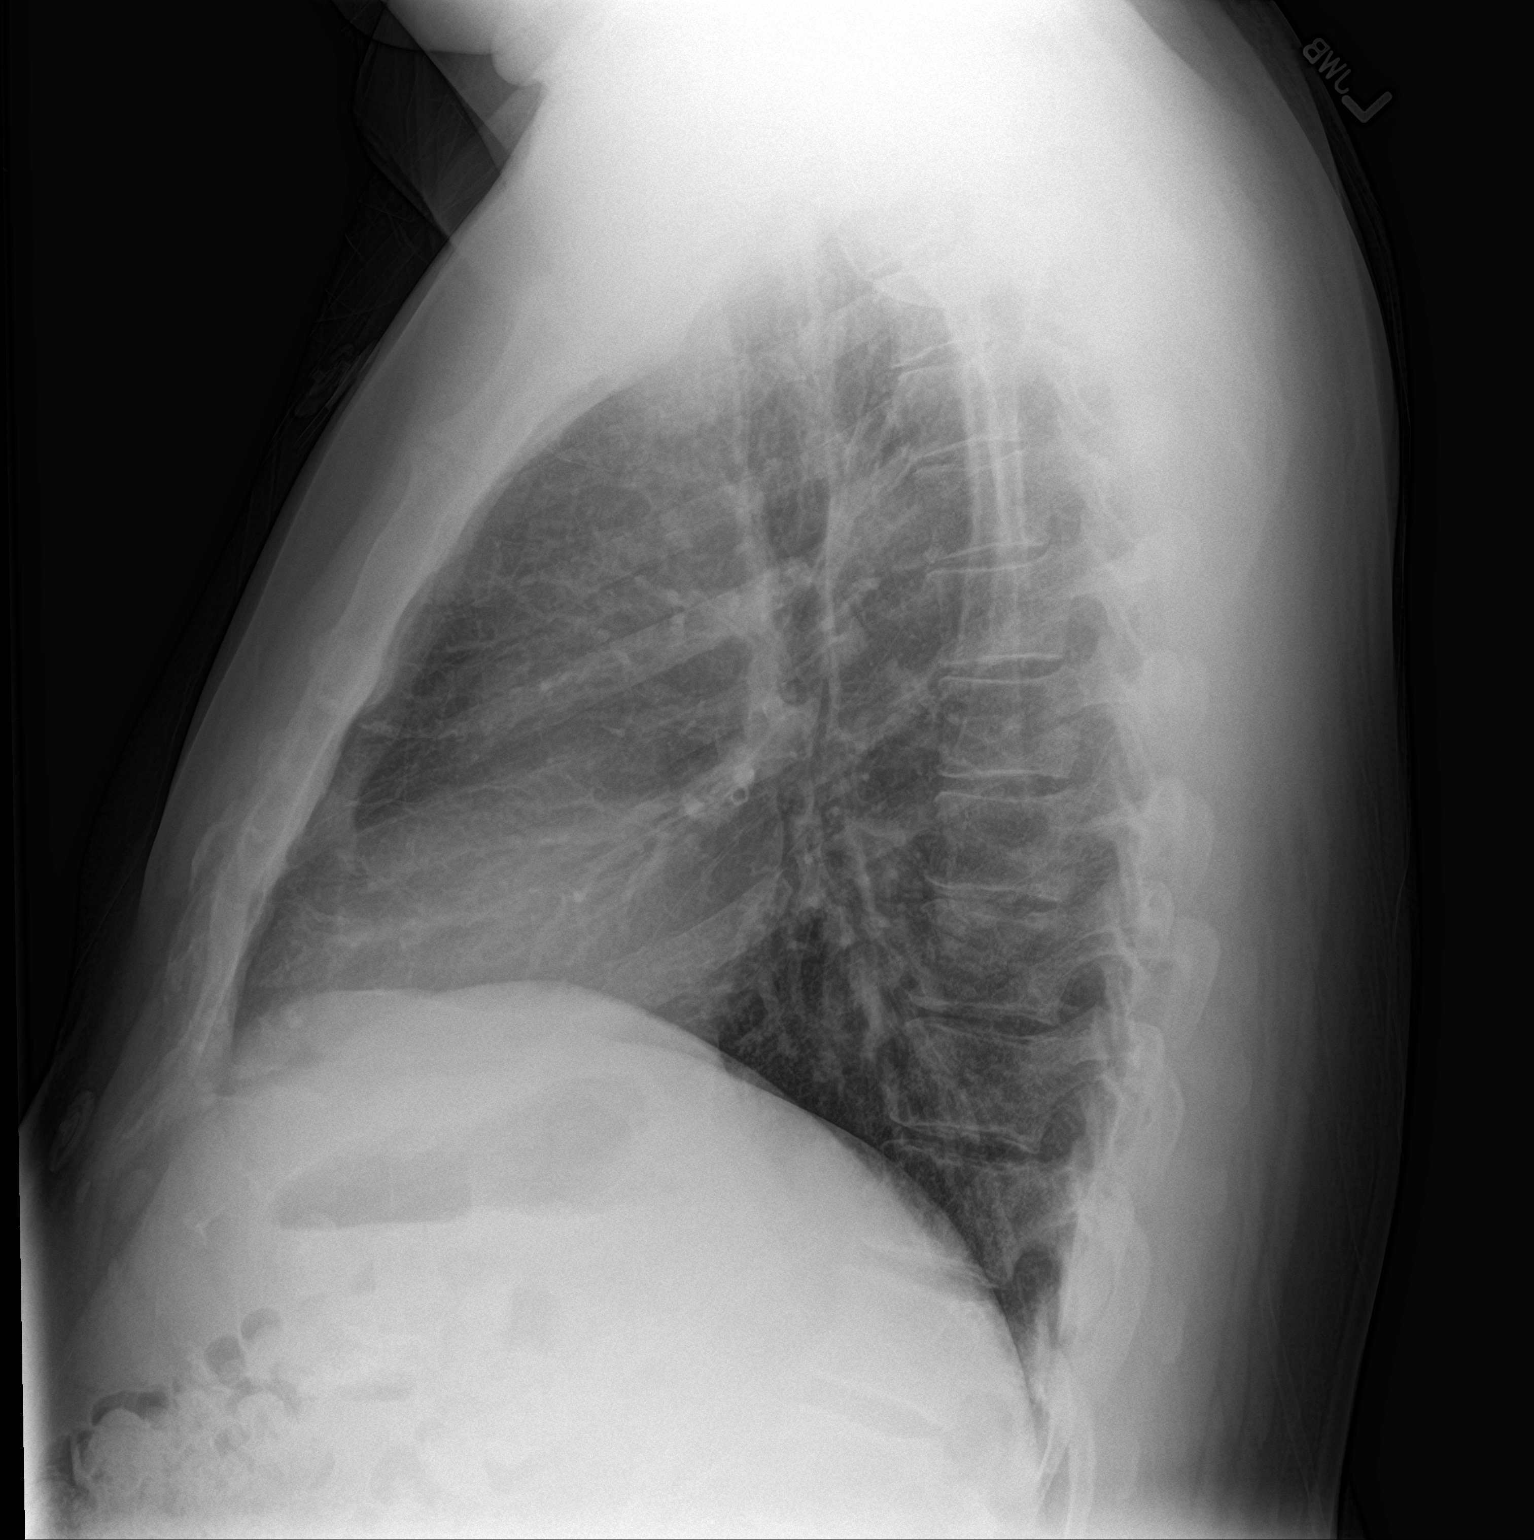

[2 of 2 positions shown; findings below may reference images not displayed]

FINDINGS: Normal heart size. Lungs clear. No pneumothorax. No pleural
effusion.
IMPRESSION: No active cardiopulmonary disease.

## 2020-01-13 ENCOUNTER — Telehealth: Payer: Self-pay | Admitting: Primary Care

## 2020-01-13 NOTE — Telephone Encounter (Signed)
Ok I think that is a reasonable plan.  Thank you.  Krystal Eaton, MD

## 2020-01-13 NOTE — Telephone Encounter (Signed)
FYI:  He was asking about getting the vaccine if he should or not and with having COVID-19 he is immune.    I informed him that I just had this question for another patient and the Dr. Lorna Dibble response was:     "I think even thought he has had covid, he should still get the shot, however, he should wait 90 days from his diagnosis to start his series."    He was good with this and wanted to wait to get the vaccine anyway.

## 2020-01-17 ENCOUNTER — Other Ambulatory Visit: Payer: Self-pay | Admitting: Primary Care

## 2020-01-19 NOTE — Telephone Encounter (Signed)
Telemed 10-01-19 no future apt.  CMP, A1c 10/20 and no future orders.

## 2020-03-09 ENCOUNTER — Telehealth: Payer: Self-pay | Admitting: Primary Care

## 2020-03-09 NOTE — Telephone Encounter (Signed)
Pt needs to be scheduled for htn appointment with laura 40 min  Let Stanislaus Kaltenbach know when scheduled

## 2020-03-14 ENCOUNTER — Other Ambulatory Visit: Payer: Self-pay | Admitting: Primary Care

## 2020-03-14 NOTE — Telephone Encounter (Signed)
Last seen 10-21-19 and no future apt.  CMP, A1c 10/20 and no future orders.

## 2020-03-15 ENCOUNTER — Encounter: Payer: Self-pay | Admitting: Gastroenterology

## 2020-03-15 LAB — HM DIABETES EYE EXAM

## 2020-04-14 ENCOUNTER — Telehealth: Payer: Self-pay | Admitting: Primary Care

## 2020-04-14 DIAGNOSIS — E119 Type 2 diabetes mellitus without complications: Secondary | ICD-10-CM

## 2020-04-14 NOTE — Telephone Encounter (Signed)
DM/HTN Pt last seen via telemed 10/01/19  LOV for dm 10/21/19  Overdue for A1c  No upcoming appt  No future labs ordered    Spoke with patient to schedule dm/htn fuv with LC or MB.  Patient states he is feeling fine and would rather just do a zoom meeting with Dr. [name].  I explained at some point we would prefer to have him come in to the office for bp reading and offered to schedule appt out a bit. Patient declines, stating "why can't they just take my bp when I'm at the lab". I explained bps are not done at the labs, but if he's able to get a reading at home, that would be fine with the zoom mtg.   Patient stated "I don't and I don't need to pay for an office visit when I can just do labs.  I'll only do a zoom meeting".     Pat/Linda-please schedule   Patient was made aware he will receive a call back to schedule the zoom meeting and let him know what labs are ordered.

## 2020-04-15 NOTE — Telephone Encounter (Addendum)
(  Ok to wait for Monday 4/19)    Labs (blood and urine) ordered thanks.    He can do a zoom appointment but he should also set up a BP check with a tech or nurse here at the office.      Krystal Eaton, MD

## 2020-04-15 NOTE — Addendum Note (Signed)
Addended by: Krystal Eaton on: 04/15/2020 04:48 PM     Modules accepted: Orders

## 2020-04-19 NOTE — Telephone Encounter (Addendum)
Called patient - scheduled video visit 04-26-20 with Dr [name] and BP check 05-03-20.  Patient again refused to do in office visit with Dr [name] as he said his schedule is just too busy.

## 2020-04-25 ENCOUNTER — Other Ambulatory Visit
Admission: RE | Admit: 2020-04-25 | Discharge: 2020-04-25 | Disposition: A | Discharge status: Home / Self Care | Payer: No Typology Code available for payment source | Source: Ambulatory Visit | Attending: Primary Care | Admitting: Primary Care

## 2020-04-25 DIAGNOSIS — E119 Type 2 diabetes mellitus without complications: Secondary | ICD-10-CM | POA: Insufficient documentation

## 2020-04-25 DIAGNOSIS — R7989 Other specified abnormal findings of blood chemistry: Secondary | ICD-10-CM | POA: Insufficient documentation

## 2020-04-25 LAB — CBC
Hematocrit: 47 % (ref 40–51)
Hemoglobin: 15.9 g/dL (ref 13.7–17.5)
MCH: 30 pg (ref 26–32)
MCHC: 34 g/dL (ref 32–37)
MCV: 88 fL (ref 79–92)
Platelets: 210 10*3/uL (ref 150–330)
RBC: 5.4 MIL/uL (ref 4.6–6.1)
RDW: 13.3 % (ref 11.6–14.4)
WBC: 7.6 10*3/uL (ref 4.2–9.1)

## 2020-04-25 LAB — LIPID PANEL
Chol/HDL Ratio: 2.6
Cholesterol: 94 mg/dL
HDL: 36 mg/dL — ABNORMAL LOW (ref 40–60)
LDL Calculated: 46 mg/dL
Non HDL Cholesterol: 58 mg/dL
Triglycerides: 58 mg/dL

## 2020-04-25 LAB — COMPREHENSIVE METABOLIC PANEL
ALT: 69 U/L — ABNORMAL HIGH (ref 0–50)
AST: 50 U/L (ref 0–50)
Albumin: 4.6 g/dL (ref 3.5–5.2)
Alk Phos: 116 U/L (ref 40–130)
Anion Gap: 13 (ref 7–16)
Bilirubin,Total: 0.8 mg/dL (ref 0.0–1.2)
CO2: 26 mmol/L (ref 20–28)
Calcium: 9.9 mg/dL (ref 8.6–10.2)
Chloride: 100 mmol/L (ref 96–108)
Creatinine: 1.04 mg/dL (ref 0.67–1.17)
GFR,Black: 94 *
GFR,Caucasian: 82 *
Glucose: 126 mg/dL — ABNORMAL HIGH (ref 60–99)
Lab: 14 mg/dL (ref 6–20)
Potassium: 4.1 mmol/L (ref 3.3–5.1)
Sodium: 139 mmol/L (ref 133–145)
Total Protein: 7.5 g/dL (ref 6.3–7.7)

## 2020-04-25 LAB — MICROALBUMIN, URINE, RANDOM
Creatinine,UR: 22 mg/dL (ref 20–300)
Microalbumin,UR: <1.2 mg/dL

## 2020-04-26 ENCOUNTER — Encounter: Payer: Self-pay | Admitting: Primary Care

## 2020-04-26 ENCOUNTER — Ambulatory Visit: Payer: No Typology Code available for payment source | Admitting: Primary Care

## 2020-04-26 ENCOUNTER — Telehealth: Payer: Self-pay | Admitting: Primary Care

## 2020-04-26 VITALS — Ht 74.0 in | Wt 265.0 lb

## 2020-04-26 DIAGNOSIS — I10 Essential (primary) hypertension: Secondary | ICD-10-CM

## 2020-04-26 DIAGNOSIS — E119 Type 2 diabetes mellitus without complications: Secondary | ICD-10-CM

## 2020-04-26 LAB — HEMOGLOBIN A1C: Hemoglobin A1C: 7.2 % — ABNORMAL HIGH

## 2020-04-26 LAB — PCMH DEPRESSION ASSESSMENT

## 2020-04-26 NOTE — Progress Notes (Signed)
Video Visit     Location of Patient: home    Location of Telemedicine Provider: hospital / clinical location    Other participants in telemedicine encounter and roles:  n/a    This is an established patient visit.    Reason for visit: Follow-up (DM)      HPI    DM:  180 after breakfast, lunch, up "a little higher" after dinner.  Morning sugars were about 110-120.  No hypoglycemia.  No polyuria.  He notices a little more water intake but not excessive.  A1c improved to 7.2.  Current therapy is glipizide 10 mg BID, metformin 1,000 mg BID, and jardiance 10 mg daily.      HTN:  Currently taking losartan 25 mg daily.  No chest pain or shortness of breath (which had been an issue for him previously around the time of changing blood pressure medication therapy).      ROS    Received second dose of shingrix on 4/15.  Scheduled for J&J tomorrow 4/28.  Gave the ok to do that since he is no longer having side effects related to the second shingrix shot (fatigue, malaise, body aches).      Patient's problem list, allergies, and medications were reviewed and updated as appropriate.  Please see the EHR for full details.    Exam and data reviewed:  Gen: Alert and oriented, NAD.  Pulm: Breathing comfortably at rest without accessory muscle use.  Cardiac: Normal coloration.    Neuro: Interactive, able to provide detailed history, Normal speech and mood.    Assessment & Plan:    1. Diabetes mellitus - A1c above goal.  He is planning on resuming more physical activity in the next week, so we will see his A1c again in 3 months time.  If still above 7 then, then would plan to increase Jardiance to 25 mg daily.  For now he will continue Metformin 1000 mg twice a day, glipizide 10 mg twice a day, and Jardiance 10 mg/day.  Victoza caused intolerable GI upset.   2. Essential hypertension -blood pressure check in early May as scheduled.  Otherwise continue losartan 25 mg daily.     Consent was obtained from the patient to complete this video  visit; including the potential for financial liability.      Krystal Eaton, MD

## 2020-04-26 NOTE — Telephone Encounter (Signed)
Reviewed at today's appointment.  Patient does not follow with podiatry.  Explained he is overdue for diabetic exam and appointment will need to be in office.  Patient did have eye exam.  Called the [facility] in [place] and report from March 2021 being sent.  Patient does not have home BP cuff for BP check.  He is scheduled for BP check in office.

## 2020-04-27 ENCOUNTER — Encounter: Payer: Self-pay | Admitting: Primary Care

## 2020-04-29 LAB — LC/MS F&T TESTOSTERONE: TESTOSTERONE,FREE: 8.06 ng/dL (ref 4.06–15.6)

## 2020-04-29 LAB — FREE AND TOTAL TESTOSTERONE, ULTRASENSITIVE (TOTAL 7-2000 NG/ML; FREE 0.13-250 NG/DL), LC-MS SEND-OUT: Testosterone,Total: 424 ng/dL (ref 240–950)

## 2020-05-03 ENCOUNTER — Ambulatory Visit: Payer: No Typology Code available for payment source

## 2020-05-03 ENCOUNTER — Telehealth: Payer: Self-pay | Admitting: Primary Care

## 2020-05-03 NOTE — Telephone Encounter (Signed)
That blood pressure looks great - no changes to medications are recommended then.  Thanks for stopping in to get it checked.  Krystal Eaton, MD

## 2020-05-03 NOTE — Telephone Encounter (Signed)
Patient in for BP check with nurse, was 116/80, HR 66.

## 2020-05-04 NOTE — Telephone Encounter (Signed)
Left voice mail for patient (ok) letting him know:     That blood pressure looks great - no changes to medications are recommended then.  Thanks for stopping in to get it checked

## 2020-06-04 ENCOUNTER — Other Ambulatory Visit: Payer: Self-pay | Admitting: Primary Care

## 2020-06-06 NOTE — Telephone Encounter (Signed)
Telemed 04-26-20 and next apt 07-29-20. A1c, CMP, lipids, urine microalbumin 4/21 and all ordered but urine.

## 2020-06-06 NOTE — Telephone Encounter (Signed)
Please let patient know I've sent in a script for the higher dose of jardiance 25 mg daily since the last A1c was above 7.  Update labs 1-2 weeks before next appointment please.

## 2020-06-13 ENCOUNTER — Telehealth: Payer: Self-pay | Admitting: Primary Care

## 2020-06-13 NOTE — Telephone Encounter (Signed)
Patient is having an issue now with the warmer months that the [facility] sensor is not staying on for the 14 days.  He wonders if you have any thoughts on the Dexcom G 6 if it may stay any better since it is located on the abdomen. I did let patient know there is a large spot bandaid you can purchase on to cover the [employer].  Patient said he will look into that if the [facility] isn't an option.

## 2020-06-15 NOTE — Telephone Encounter (Signed)
Let patient know:     I'm not aware if the other sensor does a better job sticking or not but I imagine any adhesive is going to be less effective in the warmer/moist months of the year.  I would try reinforcing what he has with the covering bandaid and let us know if still running into problems.  The cost/coverage of the Dexcom system may not be worth it - out of pocket is several hundred dollars each for the transmitter, sensor, and receiver    He understands and will let us know how he does

## 2020-06-15 NOTE — Telephone Encounter (Addendum)
I'm not aware if the other sensor does a better job sticking or not but I imagine any adhesive is going to be less effective in the warmer/moist months of the year.  I would try reinforcing what he has with the covering bandaid and let us know if still running into problems.  The cost/coverage of the Dexcom system may not be worth it - out of pocket is several hundred dollars each for the transmitter, sensor, and receiver.      Krystal Eaton, MD

## 2020-06-26 ENCOUNTER — Other Ambulatory Visit: Payer: Self-pay | Admitting: Primary Care

## 2020-06-26 DIAGNOSIS — E119 Type 2 diabetes mellitus without complications: Secondary | ICD-10-CM

## 2020-06-27 NOTE — Telephone Encounter (Signed)
LV  10/21/19    NV  07/29/20     LABS  04/25/20

## 2020-07-15 ENCOUNTER — Telehealth: Payer: Self-pay | Admitting: Primary Care

## 2020-07-15 ENCOUNTER — Emergency Department: Payer: No Typology Code available for payment source

## 2020-07-15 ENCOUNTER — Emergency Department
Admission: EM | Admit: 2020-07-15 | Discharge: 2020-07-15 | Disposition: A | Discharge status: Home / Self Care | Payer: No Typology Code available for payment source | Source: Ambulatory Visit | Attending: Emergency Medicine | Admitting: Emergency Medicine

## 2020-07-15 DIAGNOSIS — K047 Periapical abscess without sinus: Secondary | ICD-10-CM

## 2020-07-15 DIAGNOSIS — Y92310 Basketball court as the place of occurrence of the external cause: Secondary | ICD-10-CM | POA: Insufficient documentation

## 2020-07-15 DIAGNOSIS — R519 Headache, unspecified: Secondary | ICD-10-CM

## 2020-07-15 DIAGNOSIS — Y998 Other external cause status: Secondary | ICD-10-CM | POA: Insufficient documentation

## 2020-07-15 DIAGNOSIS — Y9382 Activity, spectator at an event: Secondary | ICD-10-CM | POA: Insufficient documentation

## 2020-07-15 DIAGNOSIS — S0993XA Unspecified injury of face, initial encounter: Secondary | ICD-10-CM | POA: Insufficient documentation

## 2020-07-15 DIAGNOSIS — W2105XA Struck by basketball, initial encounter: Secondary | ICD-10-CM | POA: Insufficient documentation

## 2020-07-15 MED ORDER — IBUPROFEN 600 MG PO TABS *I*
600.0000 mg | ORAL_TABLET | Freq: Once | ORAL | Status: AC
Start: 2020-07-15 — End: 2020-07-15
  Administered 2020-07-15: 600 mg via ORAL
  Filled 2020-07-15: qty 1

## 2020-07-15 NOTE — Telephone Encounter (Signed)
Care Coordination Outreach  by Data Analyst     Select F2 to Complete Form     Reached: Patient   Topic: DM labs for 07/29/20   Outcome: Spoke with patient. Confirmed date/time of appt and he was reminded to do fasting labs a few days prior to appt.      Is the outreach for a Metric? yes      By: Leonard Downing   Date: 07/15/20
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
## 2020-07-15 NOTE — ED Provider Notes (Signed)
History     Chief Complaint   Patient presents with    Facial Injury     Patient is a 54 yo M with a PMH of DM, HTN and HLD who presents to the Emergency Department (ED) for facial pain. 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 Incident occurred around 1815. No LOC. Patient was immediately taken out of the game. Patient reports he has had bilateral cheek pain and lower forehead pain since that time. Was evaluated by sports trainer who was concerned for a concussion. No analgesia PTA. Denies any fevers, chills, visual changes, neck pain, neck stiffness, chest pain, shortness of breath, abd pain, nausea, vomiting, numbness, tingling or weakness.       History provided by:  Patient  Language interpreter used: No        Medical/Surgical/Family History     Past Medical History:   Diagnosis Date    Arthritis     knees, hands    Colon polyp     Complication of anesthesia     slow to awaken    Diabetes mellitus     Hypertension         Patient Active Problem List   Diagnosis Code    Diabetes mellitus E11.9    Essential hypertension I10    Mixed hyperlipidemia E78.2    Strain of Achilles tendon, right, initial encounter S86.011A            Past Surgical History:   Procedure Laterality Date    JOINT REPLACEMENT      KNEE SURGERY Bilateral     reconstruction; several surgeries    TONSILLECTOMY       Family History   Problem Relation Age of Onset    Heart Disease Father           Social History     Tobacco Use    Smoking status: Former Smoker    Smokeless tobacco: Former Systems developer     Quit date: 01/01/1992   Substance Use Topics    Alcohol use: Yes     Comment:  monthly    Drug use: No     Living Situation     Questions Responses    Patient lives with Significant Other    Homeless No    Caregiver for other family member     External Services     Employment Employed    Domestic Violence Risk                 Review of Systems   Review of Systems    Constitutional: Negative for chills and fever.   HENT: Negative for facial swelling.    Eyes: Negative for photophobia and visual disturbance.   Respiratory: Negative for shortness of breath.    Cardiovascular: Negative for chest pain.   Gastrointestinal: Negative for abdominal pain, nausea and vomiting.   Musculoskeletal: Negative for back pain, neck pain and neck stiffness.   Skin: Negative for wound.   Neurological: Positive for headaches. Negative for dizziness, syncope, weakness, light-headedness and numbness.   Psychiatric/Behavioral: Negative for confusion.       Physical Exam     Triage Vitals  Triage Start: Start, (07/15/20 1948)   First Recorded BP: 162/84, Resp: 17, Temp: 36.3 C (97.3 F), Temp src: TEMPORAL Oxygen Therapy SpO2: 98 %, Oximetry Source: Lt Hand, O2 Device: None (Room air), Heart Rate: 89, (07/15/20 1950)  .  First Pain Reported  0-10 Scale: 4, Pain Location/Orientation: Head, (07/15/20 1950)       Physical Exam  Vitals and nursing note reviewed.   Constitutional:       General: He is not in acute distress.  HENT:      Head: Normocephalic and atraumatic.      Jaw: There is normal jaw occlusion.      Comments: TTP over bilateral maxillary region and lower mid forehead. No overlying swelling, bruising, erythema or ecchymosis. No deformity or step off appreciated.      Nose: Nose normal.      Mouth/Throat:      Mouth: Mucous membranes are moist.      Pharynx: Oropharynx is clear.   Eyes:      Extraocular Movements: Extraocular movements intact.      Conjunctiva/sclera: Conjunctivae normal.      Pupils: Pupils are equal, round, and reactive to light.   Cardiovascular:      Rate and Rhythm: Normal rate and regular rhythm.   Pulmonary:      Effort: Pulmonary effort is normal.      Breath sounds: Normal breath sounds.   Abdominal:      Palpations: Abdomen is soft.      Tenderness: There is no abdominal tenderness. There is no guarding or rebound.   Musculoskeletal:      Cervical back: Normal  range of motion and neck supple. No tenderness or bony tenderness. No spinous process tenderness or muscular tenderness.      Thoracic back: No tenderness or bony tenderness.      Lumbar back: No tenderness or bony tenderness.   Skin:     General: Skin is warm and dry.   Neurological:      General: No focal deficit present.      Mental Status: He is alert and oriented to person, place, and time.      GCS: GCS eye subscore is 4. GCS verbal subscore is 5. GCS motor subscore is 6.      Cranial Nerves: Cranial nerves are intact.   Psychiatric:         Behavior: Behavior normal. Behavior is cooperative.         Medical Decision Making   Patient seen by me on:  07/15/2020    Assessment:  Patient is a 54 yo M with a PMH of DM, HTN and HLD who presents to the Emergency Department (ED) for facial pain after being hit with a foul ball during a baseball game her was umpiring. Was wearing face mask at the time of the incident. No LOC. No focal neurological deficits on exam. Overall well appearing.     Differential diagnosis:  Contusion, fracture, concussion    Plan:  Orders Placed This Encounter      CT maxillofacial without contrast    PO ibuprofen     Independent review of: CT scans, chart/prior records    ED Course and Disposition:  CT showed no acute fracture. Mild soft tissue edema of the upper lip and nose.    Patient and spouse updated on results. Okay for discharge with follow up with PCP. Return precautions discussed.        Mariana Arn, MD          Mariana Arn, MD  07/16/20 [phone]

## 2020-07-15 NOTE — Discharge Instructions (Addendum)
You were evaluated in the Emergency Department (ED) for facial pain after being struck in the face with a baseball. Your imaging and physical exam were reassuring and you were felt safe for discharge. Please follow up with your Primary Care Provider to discuss your recent ED visit. Please return to the ED or seek immediate medical attention if you begin to experience any of the following:    You suddenly feel lightheaded and short of breath.  You have chest pain when you take a deep breath or cough.  You cough up blood.  You suddenly have trouble chewing or swallowing.  You have clear or pinkish fluid draining from your nose or mouth.  You have numbness in your face.  You have worsening pain in your eye or face.  You have double vision or sudden trouble seeing.  You develop any new or concerning symptoms.

## 2020-07-18 ENCOUNTER — Other Ambulatory Visit: Payer: Self-pay | Admitting: Primary Care

## 2020-07-18 ENCOUNTER — Telehealth: Payer: Self-pay | Admitting: Primary Care

## 2020-07-18 NOTE — Telephone Encounter (Signed)
Would follow up on 7/30 as scheduled please.    Krystal Eaton, MD

## 2020-07-18 NOTE — Telephone Encounter (Signed)
Patient was discharged from [facility] ED on 07/15/20 with diagnosis of Facial pain.  Not sure if this might be considered WC? He was umpiring a college baseball game.     You were evaluated in the Emergency Department (ED) for facial pain  after being struck in the face with a baseball. Your imaging and physical  exam were reassuring and you were felt safe for discharge. Please follow up with your Primary Care Provider to discuss your recent ED visit.    Note:  Patient does have a scheduled 3 mo fuv on 07/29/20    Does patient need a follow up visit with you? If so, when does patient need to be seen and what type of visit would you prefer? (ex: in-office, video, telephone).     Please route to [facility] for ED fuv call to be placed and to schedule the appointment.

## 2020-07-18 NOTE — Telephone Encounter (Signed)
Last appointment: 04/26/2020 telemed visit  Next appointment: 07/29/2020  Labs  Done 04/25/2020

## 2020-07-26 ENCOUNTER — Other Ambulatory Visit
Admission: RE | Admit: 2020-07-26 | Discharge: 2020-07-26 | Disposition: A | Discharge status: Home / Self Care | Payer: No Typology Code available for payment source | Source: Ambulatory Visit | Attending: Primary Care | Admitting: Primary Care

## 2020-07-26 DIAGNOSIS — E119 Type 2 diabetes mellitus without complications: Secondary | ICD-10-CM | POA: Insufficient documentation

## 2020-07-26 LAB — COMPREHENSIVE METABOLIC PANEL
ALT: 59 U/L — ABNORMAL HIGH (ref 0–50)
AST: 35 U/L (ref 0–50)
Albumin: 4.4 g/dL (ref 3.5–5.2)
Alk Phos: 92 U/L (ref 40–130)
Anion Gap: 10 (ref 7–16)
Bilirubin,Total: 0.5 mg/dL (ref 0.0–1.2)
CO2: 26 mmol/L (ref 20–28)
Calcium: 9.8 mg/dL (ref 8.6–10.2)
Chloride: 105 mmol/L (ref 96–108)
Creatinine: 1.05 mg/dL (ref 0.67–1.17)
GFR,Black: 93 *
GFR,Caucasian: 81 *
Glucose: 178 mg/dL — ABNORMAL HIGH (ref 60–99)
Lab: 23 mg/dL — ABNORMAL HIGH (ref 6–20)
Potassium: 5 mmol/L (ref 3.3–5.1)
Sodium: 141 mmol/L (ref 133–145)
Total Protein: 6.9 g/dL (ref 6.3–7.7)

## 2020-07-26 LAB — LIPID PANEL
Chol/HDL Ratio: 2.6
Cholesterol: 103 mg/dL
HDL: 40 mg/dL (ref 40–60)
LDL Calculated: 52 mg/dL
Non HDL Cholesterol: 63 mg/dL
Triglycerides: 53 mg/dL

## 2020-07-26 LAB — HEMOGLOBIN A1C: Hemoglobin A1C: 7.7 % — ABNORMAL HIGH

## 2020-07-29 ENCOUNTER — Ambulatory Visit: Payer: No Typology Code available for payment source | Admitting: Primary Care

## 2020-07-29 ENCOUNTER — Encounter: Payer: Self-pay | Admitting: Primary Care

## 2020-07-29 VITALS — BP 128/70 | HR 76 | Wt 265.0 lb

## 2020-07-29 DIAGNOSIS — E119 Type 2 diabetes mellitus without complications: Secondary | ICD-10-CM

## 2020-07-29 DIAGNOSIS — G44309 Post-traumatic headache, unspecified, not intractable: Secondary | ICD-10-CM

## 2020-07-29 DIAGNOSIS — I10 Essential (primary) hypertension: Secondary | ICD-10-CM

## 2020-07-29 MED ORDER — DEXCOM G6 SENSOR MISC *A*
2 refills | Status: DC
Start: 2020-07-29 — End: 2020-10-19

## 2020-07-29 MED ORDER — DEXCOM G6 TRANSMITTER MISC *A*
1 refills | Status: DC
Start: 2020-07-29 — End: 2020-10-19

## 2020-07-29 MED ORDER — DEXCOM G6 RECEIVER DEVI *A*
0 refills | Status: DC
Start: 2020-07-29 — End: 2020-10-19

## 2020-07-29 NOTE — Progress Notes (Addendum)
[facility]  Outpatient Visit Note       Reason For Visit: Follow-up    Chief Complaint   Patient presents with    Diabetes    Hypertension    - concussion    Subjective:      Noah Craig is a 54 y.o. male with DM and essential HTN here for a follow-up visit.  Issues discussed below.    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 Patient experienced a sudden onset headache. Was unable to track finger with eyes on exam by baseball team's physical therapist. Also memory surrounding event wasn't entirely clear. Patient went to local ED, where a CT head was negative. Patient felt "crummy" and had a headache for 1 day. Patient says headaches only return when he's shooting skeet, which he refrained for 5 days post-concussion. Patient says that wearing a cryogenic helmet (ie helmet with packed with ice) has helped with headaches while shooting.     Patient denies N/V/D, vision changes, or excessive sleepiness.     Diabetes  - patient has been taking metformin 1000 mg BID, empagliflozin 25 mg daily, and glipizide 10 mg daily as directed  - patient has been checking blood glucose at random times during day (every day). BG have ranged from 130 to 150.  - patient says eating a healthy diet has been challenging given the irregular hours of his work, requiring him to eat out frequently.   - Denies numbness, changes in sensation, polydipsia, polyuria.     HTN:   - patient has been taking losartan 26 mg as directed.  - does not check BP at home. Office BP today was 128/70  - denies lightheadedness, falls, chest pain, SOB, vision changes, or new headaches (prior to concussion 2 weeks ago)      Home Medications:     Prior to Admission medications    Medication Sig Start Date End Date Taking? Authorizing Provider   continuous blood glucose monitor (FREESTYLE LIBRE 14 DAY) sensor TEST FOUR TIMES DAILY WITH  MEALS AND AT NIGHT, APPLY SENSOR TO THE BACK OR UPPER ARM, REPLACE SENSOR EVERY 14 DAYS 06/27/20   Krystal Eaton, MD   metFORMIN (GLUCOPHAGE-XR) 500 MG 24 hr tablet TAKE 2 TABLETS BY MOUTH TWO TIMES DAILY (AFTER MEALS), SWALLOW WHOLE, DO NOT CRUSH, BREAK OR CHEW 07/18/20   Krystal Eaton, MD   empagliflozin (JARDIANCE) 25 mg tablet Take 1 tablet (25 mg total) by mouth every morning 06/06/20   Krystal Eaton, MD   losartan (COZAAR) 25 MG tablet Take 1 tablet (25 mg total) by mouth daily Pt needs apt. 03/14/20   Krystal Eaton, MD   glipiZIDE (GLUCOTROL) 10 MG tablet Take 1 tablet (10 mg total) by mouth 2 times daily (before meals) Pt needs appt 03/14/20   Krystal Eaton, MD   continuous blood glucose monitor (FREESTYLE LIBRE) reader Use with Freestyle Libre CGM sensor 10/22/19   Krystal Eaton, MD   continuous blood glucose monitor (FREESTYLE LIBRE) reader Use with Freestyle Libre CGM sensor 10/21/19   Leonie Douglas, MD   sildenafil (REVATIO) 20 MG tablet 2-5 tab daily prn, taken 30-60 minutes before sexual activity 10/01/19   Penni Bombard, MD   B-D ULTRAFINE III SHORT PEN 31G X 8 MM USE ONCE DAILY AS DIRECTED 01/01/18   Krystal Eaton, MD   naproxen sodium (ANAPROX) 220 MG  tablet Take 220 mg by mouth 2 times daily (with meals)   Pt takes as needed    [provider]       Allergies:     Allergies   Allergen Reactions    Lisinopril Cough    Victoza [Liraglutide] Other (See Comments)     GI upset    No Known Latex Allergy      Created by Conversion - 0;      Review of Systems:     As detailed above by problem.     Physical Exam:  Temp Readings from Last 3 Encounters:   07/15/20 36.4 C (97.5 F) (Temporal)   10/21/19 36.6 C (97.8 F) (Temporal)   06/03/18 36.3 C (97.3 F) (Temporal)     BP Readings from Last 3 Encounters:   07/15/20 (P) 138/76   05/03/20 116/80   12/04/19 160/88     Pulse Readings from Last 3 Encounters:   07/29/20 76   07/15/20 89   05/03/20 66         Vitals:     07/29/20 1422   BP: 128/70   BP Location: Right arm   Patient Position: Sitting   Cuff Size: large adult   Pulse: 76   SpO2: 98%   Weight: 120.2 kg (265 lb)     Wt Readings from Last 3 Encounters:   07/29/20 120.2 kg (265 lb)   07/15/20 118.8 kg (262 lb)   04/26/20 120.2 kg (265 lb)     General: Well-developed male, in no acute distress.  Lungs: Breathing comfortably at rest.  Lungs CTAB without adventitious sounds.  Cardiovascular: RRR no m/r/g.  Extremities warm and dry to touch.   Extremities: No edema, no rashes. Diabetic foot exam: Readily palpable DP and PT pulses bilaterally.  Skin intact without ulceration. 10/10 monofilament sites tested intact bilaterally.     Neurologic: Fully interactive and cooperative with exam.  Able to provide an accurate history.  Speech and mood appropriate.      ASSESSMENT and Plan:     Noah Craig is a 54 y.o. male with DM and essential HTN here for a follow-up visit. Plan by problem below.    Post concussive headache  - symptoms appear to be resolving  - PRN OTC NSAIDs for headaches    DM:  - Last A1C  = 7.7 on 7/27, above goal of < 7.0.  - LFTs, creatinine wnl on 7/27  - HDL, LDL acceptable on 7/27. No current need for statin or ASA.  - Patient will investigate prices of different BG monitoring systems  - will continue current meds (metformin, empagliflozin, and glipizide) for now and considering adding agents at next visit.  Was intolerant of victoza.  May need to consider pioglitazone vs starting long-acting insulin.      Essential HTN  - at goal  - c/w losartan. Serum K wnl on 7/27    Follow-up: ~ 6 months    Plan discussed with Dr. [name].     Freeman Caldron. Gretta Cool, MD  [facility] Internal Medicine PGY-1  07/29/20    Attending Physician Addendum:    I personally interviewed, examined and discussed the care of the patient with Dr. Gretta Cool at the time of the visit including HPI, PMH, home medications, family history, social history, and ROS, as well as physical examination, labs,  assessment, and plans. I agree with the assessment and plan of care as documented, which has been reviewed and edited as needed to reflect  our visit and plan for today.      Krystal Eaton, MD  [facility]  07/31/20 @ 10:05 PM

## 2020-07-31 ENCOUNTER — Encounter: Payer: Self-pay | Admitting: Primary Care

## 2020-08-02 ENCOUNTER — Telehealth: Payer: Self-pay | Admitting: Primary Care

## 2020-08-02 NOTE — Telephone Encounter (Signed)
I spoke to patient gave message back from prior auth team that the Dexcom system is under a DME (durable medical equipment) part of his insurance and he should call his insurance company to see where he can get this.      Ref:  Noah Craig  to Me    LL   08/02/20 2:49 PM  These are DME and we do not get auths for them)    He verbalized understanding.

## 2020-08-02 NOTE — Telephone Encounter (Signed)
Pharmacy:  [facility]  Drug:  Dexcom G6 Sensor    Key:    Roxanne Gates  Drug:  Dexcom G6 Receiver Device  Key:    [id number]  Drug:  Dexcom G6 Transmitter  Key:    [id number]    Please responded to [employer].

## 2020-08-04 NOTE — Telephone Encounter (Signed)
Prior authorizations forms completed for receiver, sensor and transmitter.  On Dr Winfield Cunas desk for signature.

## 2020-08-05 NOTE — Telephone Encounter (Signed)
Call from [employer] not covered as patient is not on insulin.   Called and left message on patient's voice mail.

## 2020-08-05 NOTE — Telephone Encounter (Signed)
Prior authorizations faxed.

## 2020-09-07 ENCOUNTER — Other Ambulatory Visit: Payer: Self-pay | Admitting: Primary Care

## 2020-09-07 NOTE — Telephone Encounter (Signed)
lv   07/29/20     nv   12/01/20     Labs  727//21

## 2020-09-26 ENCOUNTER — Telehealth: Payer: Self-pay | Admitting: Primary Care

## 2020-09-26 NOTE — Telephone Encounter (Signed)
Raquel Sarna calling from [employer] calling with a denial for the DEX COM.  If you would like to appeal you can call [phone], case #[id number].

## 2020-10-11 ENCOUNTER — Encounter: Payer: Self-pay | Admitting: Primary Care

## 2020-10-11 NOTE — Telephone Encounter (Signed)
Opened in error

## 2020-10-14 ENCOUNTER — Other Ambulatory Visit
Admission: RE | Admit: 2020-10-14 | Discharge: 2020-10-14 | Disposition: A | Discharge status: Home / Self Care | Payer: No Typology Code available for payment source | Source: Ambulatory Visit | Attending: Primary Care | Admitting: Primary Care

## 2020-10-14 DIAGNOSIS — E119 Type 2 diabetes mellitus without complications: Secondary | ICD-10-CM | POA: Insufficient documentation

## 2020-10-14 LAB — COMPREHENSIVE METABOLIC PANEL
ALT: 64 U/L — ABNORMAL HIGH (ref 0–50)
AST: 38 U/L (ref 0–50)
Albumin: 4.7 g/dL (ref 3.5–5.2)
Alk Phos: 106 U/L (ref 40–130)
Anion Gap: 14 (ref 7–16)
Bilirubin,Total: 0.6 mg/dL (ref 0.0–1.2)
CO2: 22 mmol/L (ref 20–28)
Calcium: 9.4 mg/dL (ref 8.6–10.2)
Chloride: 103 mmol/L (ref 96–108)
Creatinine: 1.18 mg/dL — ABNORMAL HIGH (ref 0.67–1.17)
GFR,Black: 81 *
GFR,Caucasian: 70 *
Glucose: 194 mg/dL — ABNORMAL HIGH (ref 60–99)
Lab: 18 mg/dL (ref 6–20)
Potassium: 4.5 mmol/L (ref 3.3–5.1)
Sodium: 139 mmol/L (ref 133–145)
Total Protein: 7.2 g/dL (ref 6.3–7.7)

## 2020-10-14 LAB — LIPID PANEL
Chol/HDL Ratio: 3.1
Cholesterol: 106 mg/dL
HDL: 34 mg/dL — ABNORMAL LOW (ref 40–60)
LDL Calculated: 43 mg/dL
Non HDL Cholesterol: 72 mg/dL
Triglycerides: 144 mg/dL

## 2020-10-14 LAB — HEMOGLOBIN A1C: Hemoglobin A1C: 8 % — ABNORMAL HIGH

## 2020-10-19 ENCOUNTER — Ambulatory Visit: Payer: No Typology Code available for payment source | Admitting: Primary Care

## 2020-10-19 ENCOUNTER — Telehealth: Payer: Self-pay | Admitting: Primary Care

## 2020-10-19 ENCOUNTER — Encounter: Payer: Self-pay | Admitting: Primary Care

## 2020-10-19 VITALS — BP 134/74 | HR 67 | Temp 97.6°F | Ht 74.0 in | Wt 271.8 lb

## 2020-10-19 DIAGNOSIS — E119 Type 2 diabetes mellitus without complications: Secondary | ICD-10-CM

## 2020-10-19 MED ORDER — INSULIN LISPRO (HUMAN) 100 UNIT/ML SC SOPN *I*
PEN_INJECTOR | SUBCUTANEOUS | 1 refills | Status: DC
Start: 2020-10-19 — End: 2020-10-19

## 2020-10-19 MED ORDER — GLIPIZIDE 10 MG PO TABS *I*
10.0000 mg | ORAL_TABLET | Freq: Every day | ORAL | 1 refills | Status: DC
Start: 2020-10-19 — End: 2021-02-27

## 2020-10-19 MED ORDER — DEXCOM G6 RECEIVER DEVI *A*
0 refills | Status: DC
Start: 2020-10-19 — End: 2022-02-16

## 2020-10-19 MED ORDER — CLINDAMYCIN PHOSPHATE 1 % EX LOTN *I*
TOPICAL_LOTION | Freq: Two times a day (BID) | CUTANEOUS | 0 refills | Status: DC
Start: 2020-10-19 — End: 2024-05-24

## 2020-10-19 MED ORDER — PEN NEEDLES 31G X 6 MM MISC *A*
1 refills | Status: DC
Start: 2020-10-19 — End: 2021-02-14

## 2020-10-19 MED ORDER — DEXCOM G6 TRANSMITTER MISC *A*
1 refills | Status: DC
Start: 2020-10-19 — End: 2021-04-13

## 2020-10-19 MED ORDER — DEXCOM G6 SENSOR MISC *A*
2 refills | Status: DC
Start: 2020-10-19 — End: 2021-04-17

## 2020-10-19 MED ORDER — INSULIN LISPRO (HUMAN) 100 UNIT/ML SC SOPN *I*
PEN_INJECTOR | SUBCUTANEOUS | 1 refills | Status: DC
Start: 2020-10-19 — End: 2020-10-27

## 2020-10-19 NOTE — Telephone Encounter (Signed)
Please see about getting a prior auth for this patients Dexcom G6 Receiver device, Transmitter and sensor.  Patient has recently been prescribed insulin at 10/19/20 appt.      KEY   [id number]     Please let us know the status on this.

## 2020-10-19 NOTE — Telephone Encounter (Signed)
Approval from [employer] for this Dexcom G6     Effective 09/19/20 - 10/19/21      Case # [id number]

## 2020-10-19 NOTE — Progress Notes (Signed)
[facility]  Outpatient Visit Note       Reason For Visit:   Chief Complaint   Patient presents with    Elevated Blood Sugars    Allergies       Subjective:      Noah Craig is a 54 y.o. man with a history of HLD, HTN, DM, presenting for follow up.  Issues discussed below.    DM:  Seeing elevated sugar readings prompting him to make this visit.  Progressively increasing, often seeing 200-400 range now; has seen the 400s over the past few weeks.  Over the last week has seen as low as 130, high as 200.  He otherwise notes significant nasal stuffiness and congestion which he attributes to allergies.  Thinks he's allergic to the cat in the house.  Notices that the congestion got significant better after being away / travelling for a short period of time.  Denies polydipsia, polyuria.  He'd like to be able to get the dexcom system but was unable to get it by insurance.  Has previously been on glargine 10 units/day but was able to come off it.    Home Medications:     Prior to Admission medications    Medication Sig Start Date End Date Taking? Authorizing Provider   losartan (COZAAR) 25 MG tablet TAKE 1 TABLET BY MOUTH EVERY DAY **MUST CALL MD FOR APPOINTMENT** 09/07/20  Yes Krystal Eaton, MD   glipiZIDE (GLUCOTROL) 10 MG tablet TAKE 1 TABLET BY MOUTH TWO TIMES DAILY BEFORE MEALS PATIENT NEEDS TO MAKE APPOINTMENT 09/07/20  Yes Krystal Eaton, MD   metFORMIN (GLUCOPHAGE-XR) 500 MG 24 hr tablet TAKE 2 TABLETS BY MOUTH TWO TIMES DAILY (AFTER MEALS), SWALLOW WHOLE, DO NOT CRUSH, BREAK OR CHEW 07/18/20  Yes Krystal Eaton, MD   continuous blood glucose monitor (FREESTYLE LIBRE 14 DAY) sensor TEST FOUR TIMES DAILY WITH MEALS AND AT NIGHT, APPLY SENSOR TO THE BACK OR UPPER ARM, REPLACE SENSOR EVERY 14 DAYS 06/27/20  Yes Krystal Eaton, MD   empagliflozin (JARDIANCE) 25 mg tablet Take 1 tablet (25 mg total) by mouth every morning 06/06/20  Yes Krystal Eaton, MD   continuous blood glucose monitor  (FREESTYLE LIBRE) reader Use with Freestyle Libre CGM sensor 10/21/19  Yes Leonie Douglas, MD   naproxen sodium (ANAPROX) 220 MG tablet Take 220 mg by mouth 2 times daily (with meals)   Pt takes as needed   Yes [provider]   sildenafil (REVATIO) 20 MG tablet 2-5 tab daily prn, taken 30-60 minutes before sexual activity 10/01/19   Penni Bombard, MD       Allergies:     Allergies   Allergen Reactions    Lisinopril Cough    Victoza [Liraglutide] Other (See Comments)     GI upset    Environmental Allergies Other (See Comments)     Sneezing      No Known Latex Allergy      Created by Conversion - 0;      Review of Systems:     Patient also has a rash over the back of the neck and bilateral occipital areas that has been there for at least a couple of weeks. He notices that it flares up after shaving the back of the head. He remembers scratching the area and then having his hand come back with fluid on it. Exam shows likely folliculitis in this region, less consistent with shingles given duration of symptoms and bilateral nature. Prescribed clindamycin lotion  to use as needed, encouraged ongoing sun protection.    Physical Exam:  Temp Readings from Last 3 Encounters:   10/19/20 36.4 C (97.6 F) (Temporal)   07/15/20 36.4 C (97.5 F) (Temporal)   10/21/19 36.6 C (97.8 F) (Temporal)     BP Readings from Last 3 Encounters:   10/19/20 134/74   07/29/20 128/70   07/15/20 (P) 138/76     Pulse Readings from Last 3 Encounters:   10/19/20 67   07/29/20 76   07/15/20 89         Vitals:    10/19/20 0833   BP: 134/74   Pulse: 67   Temp: 36.4 C (97.6 F)   TempSrc: Temporal   SpO2: 99%   Weight: 123.3 kg (271 lb 12.8 oz)   Height: 1.88 m (6\' 2" )     Wt Readings from Last 3 Encounters:   10/19/20 123.3 kg (271 lb 12.8 oz)   07/29/20 120.2 kg (265 lb)   07/15/20 118.8 kg (262 lb)     General: Well-developed male, in no acute distress.  Lungs: Breathing comfortably at rest.  Lungs CTAB without adventitious  sounds.  Cardiovascular: RRR no m/r/g.  Extremities warm and dry to touch.   Abdomen: Non-distended.  Normoactive bowel sounds.  Soft, non-tender.  Extremities: No edema, no rashes.  Neurologic: Fully interactive and cooperative with exam.  Able to provide an accurate history.  Speech and mood appropriate.      ASSESSMENT and Plan:     54 y.o. male with a history of HLD, HTN, DM, presenting for follow up.  Plan by problem below.      1. Diabetes mellitus   - Above goal - Reviewed options with patient at this point and he'd like to try meal-time insulin to help with his spikes during the day.  Keep current doses of metformin and jardiance - lower glipizide to one pill per day since we are starting insulin up.  Follow up in December as scheduled with an updated A1c.  Will route note to our clinical pharmacist as well to help with titration of insulin.  Also wrote new orders for dexcom since he will be starting insulin and at higher risk for lows in the setting of current hyperglycemia.         Krystal Eaton, MD 10/19/2020 8:37 AM

## 2020-10-19 NOTE — Telephone Encounter (Signed)
Contacted pharmacy, they will run the prior auth on this,     They do need a MDD on the insulin prescription sent today.  Please let us know or send to [facility]

## 2020-10-19 NOTE — Telephone Encounter (Signed)
Resent with max daily dose, thanks.  Krystal Eaton, MD a

## 2020-10-23 NOTE — Telephone Encounter (Signed)
(  recently started on insulin and re-submitted order for dexcom)  Krystal Eaton, MD

## 2020-10-27 ENCOUNTER — Encounter: Payer: Self-pay | Admitting: Ambulatory Care

## 2020-10-27 DIAGNOSIS — E119 Type 2 diabetes mellitus without complications: Secondary | ICD-10-CM

## 2020-10-27 MED ORDER — INSULIN LISPRO (HUMAN) 100 UNIT/ML SC SOPN *I*
PEN_INJECTOR | SUBCUTANEOUS | 1 refills | Status: DC
Start: 2020-10-27 — End: 2020-11-03

## 2020-10-27 NOTE — Telephone Encounter (Signed)
Clinical Pharmacy Note:    Called patient to introduce pharmacy services and review BG's since he started mealtime insulin at last appointment. Patient confirmed that he is using Humalog 5 units + sliding scale (1 unit for every 40 mg/dL that his BG is > 140 mg/dL) with each meal and that he was able to get the Dexcom CGM as well. This has been very helpful for him to track his BG's. No hypoglycemia reported.    Typically only needs to give himself 5-7 units, but has needed as much as 10 units with dinner. He reports that his BG's after lunch and dinner are quite elevated (checked while we were on the phone and his reading was 336 mg/dL), but he is seeing the numbers start to come down from where they were before he started the Humalog. Reviewed that we started at a low dose and weekly titrations will be needed until we get him to the correct dose. Also discussed that insulin needs vary throughout the day and depending on what types of foods he eats, so it's not uncommon to need more insulin with lunch or dinner than he does with breakfast.    Given that lunch and dinner post-prandial BG's are elevated, instructed patient to increase his nutritional baseline to 8 units with lunch and dinner, and continue with 5 units at breakfast and 1:40 sliding scale with all meals - updated medication list to reflect dose change. Patient will continue monitoring BG's throughout the day using CGM.    Pharmacist will follow-up with patient in 1 week to review BG's and continue titrating insulin doses PRN.    Thank you for allowing me to participate in the care of this patient.     Verda Cumins, PharmD, Para [place], BC-ADM  Clinical Pharmacy Specialist - [facility]  [phone]

## 2020-10-27 NOTE — Addendum Note (Signed)
Addended by: Verda Cumins on: 10/27/2020 04:50 PM     Modules accepted: Orders

## 2020-11-03 ENCOUNTER — Telehealth: Payer: Self-pay | Admitting: Ambulatory Care

## 2020-11-03 MED ORDER — INSULIN LISPRO (HUMAN) 100 UNIT/ML SC SOPN *I*
PEN_INJECTOR | SUBCUTANEOUS | 1 refills | Status: DC
Start: 2020-11-03 — End: 2020-12-01

## 2020-11-03 NOTE — Telephone Encounter (Signed)
Clinical Pharmacy Note:    Called patient to introduce pharmacy services and review BG's since he started mealtime insulin at last appointment. Patient confirmed that he is using Humalog 8 units + sliding scale (1 unit for every 40 mg/dL that his BG is > 140 mg/dL) - was instructed to only use 5 units NB with breakfast, but he just got into a routine of doing 8 units with every meal. No hypoglycemia reported - lowest BG = 94 mg/dL. His 7-day glucose average for the past week = 172 mg/dL and he has been within target range 60% of the time.     Typically only needs to give himself 8 units, but reports that he adds 2 extra units if he is eating a high-carb meal such as pizza or pasta. He states that when he has high-carb snacks at bedtime, his BG's rise quite a bit overnight. Explained the rationale behind this and encouraged him to have protein containing snacks at bedtime instead of carbs.     Given that majority of BG's are now within target range, did not make any adjustments to insulin dose at this time. Updated medication list to reflect that he is using 8 units of Humalog TID. Patient will continue monitoring BG's throughout the day using CGM.    FUV scheduled with PCP in 1 month - Pharmacist will see patient during this appointment and plan for additional follow-up PRN.    Thank you for allowing me to participate in the care of this patient.     Verda Cumins, PharmD, Para March, BC-ADM  Clinical Pharmacy Specialist - [facility]  [phone]

## 2020-12-01 ENCOUNTER — Ambulatory Visit: Payer: No Typology Code available for payment source | Admitting: Primary Care

## 2020-12-01 ENCOUNTER — Encounter: Payer: Self-pay | Admitting: Primary Care

## 2020-12-01 VITALS — Ht 74.0 in | Wt 270.0 lb

## 2020-12-01 DIAGNOSIS — E119 Type 2 diabetes mellitus without complications: Secondary | ICD-10-CM

## 2020-12-01 MED ORDER — AMOXICILLIN-POT CLAVULANATE 875-125 MG PO TABS *I*
1.0000 | ORAL_TABLET | Freq: Two times a day (BID) | ORAL | 0 refills | Status: DC
Start: 2020-12-01 — End: 2021-01-20

## 2020-12-01 MED ORDER — INSULIN LISPRO (HUMAN) 100 UNIT/ML SC SOPN *I*
PEN_INJECTOR | SUBCUTANEOUS | 1 refills | Status: DC
Start: 2020-12-01 — End: 2021-01-03

## 2020-12-01 NOTE — Progress Notes (Signed)
Video Visit     Location of Patient: home    Location of Telemedicine Provider: hospital / clinical location    Other participants in telemedicine encounter and roles:  n/a    This is an established patient visit.    Reason for visit: Follow-up (DM, HTN)    HPI  DM:  Staying in range over the last 30 days - avg 163, last week avg 158.  Estimates about 7.2 over the last 30 days.  He does get infrequent hypoglycemia if he doesn't eat lunch quickly enough or loses track of time.  He does recall seeing some 90s and 100s.  No thirst or polyuria.  Now taking 6 NB with breakfast, 15 NB with lunch and dinner.  He did notice when taking 4 glucose tabs for a low sugar that it took all day for the BG to recover back down to normal.  He has also really cut down on junk food and is starting to prefer fruits to get the "sweet" craving satisfied, and even with those is moderating his intake so that a bag of grapes lasts much longer in his house now.        ROS    Patient also reports several months worth of chronic sinus congestion and opaque drainage.  He tried Flonase in the past without any benefit.  Agreeable to starting a course of Augmentin for treatment of possible bacterial sinusitis.  Rx sent in for that.    Patient's problem list, allergies, and medications were reviewed and updated as appropriate.  Please see the EHR for full details.    Exam and data reviewed:  Gen: Alert and oriented, NAD.  Pulm: Breathing comfortably at rest without accessory muscle use.  Cardiac: Normal coloration.    Neuro: Interactive, able to provide detailed history, Normal speech and mood.    Assessment & Plan:    1. Diabetes mellitus   -Improving control.  He will continue with his current regimen of glipizide 10 mg daily, Metformin 1000 mg twice a day, and lispro 6 units in be with breakfast, 15 units in be with lunch and dinner.  He can try taking just 2 glucose tabs instead of 4 at a time for as needed use for recovering from hypoglycemia  to avoid rebound hyperglycemia.       Consent was obtained from the patient to complete this video visit; including the potential for financial liability.    Krystal Eaton, MD

## 2020-12-08 ENCOUNTER — Other Ambulatory Visit: Payer: Self-pay | Admitting: Primary Care

## 2020-12-08 NOTE — Telephone Encounter (Signed)
Telemed 12-01-20 and next apt 03-02-21.  CMP, A1c, lipids 10/21 and all ordered.  Last urine microalbumin 4/21 and no order.

## 2021-01-03 ENCOUNTER — Telehealth: Payer: Self-pay

## 2021-01-03 ENCOUNTER — Other Ambulatory Visit: Payer: Self-pay

## 2021-01-03 MED ORDER — INSULIN LISPRO (HUMAN) 100 UNIT/ML SC SOPN *I*
PEN_INJECTOR | SUBCUTANEOUS | 1 refills | Status: DC
Start: 2021-01-03 — End: 2021-01-04

## 2021-01-03 NOTE — Telephone Encounter (Addendum)
Clinical Pharmacy Note:    Called patient to review BG's. Patient confirmed that he is using Humalog 5 units NB with breakfast, 15 units NB with lunch and dinner + sliding scale (1 unit for every 40 mg/dL that his BG is > 140 mg/dL) - per last telemedicine visit patient was using 6 units NB with breakfast, but decreased to 5 units as he noticed his numbers were improving. Typically uses 5-6 units with breakfast and 15-16 units with lunch and dinner. No hypoglycemia reported. His 7-day glucose average for the past week = 184 mg/dL and he has been within target range 50% of the time. However, patient reports he has not been feeling well and reports the following symptoms started 1 week ago: fever for 2 days, cough, congestion, and headaches. Patient reports his cough has resolved and he is only experiencing congestion now. Patient did not get tested for COVID as his symptoms began to resolve. Advised patient to get tested for COVID for safety and exposure purposes. Patient reports prior to his illness, he has been within target range 70-80% of the time. His 14-day glucose average = 177 mg/dL and has been within target range 55% of the time.    Discussed potential alternative options to meal time insulin including long-acting insulin and once-weekly GLP-1 agonist, Trulicity. Reviewed these options will minimize the number of injections and may require less dose adjustments to achieve control of BGs. In the past patient experienced GI upset with Victoza, but discussed Trulicity is often more tolerable as it is dosed once weekly and has the additional weight loss and cardiac benefit. Ran a test prescription for Trulicity which will be covered and cost $0. For a 90-day supply, Tyler Aas will cost $75. At this time, patient would like to continue with Humalog since he reports he was well-controlled prior to his illness and may be open to discussing other options at his next appointment with PCP in March.    Given that  patient is overcoming an illness and he reports his BG's have been in target range prior, did not make any adjustments to insulin dose at this time. Updated medication list to reflect that he is using 5 units NB with breakfast, 15 units NB with lunch and dinner of Humalog TID. Patient will continue monitoring BG's throughout the day using CGM.    Pharmacist will follow up with patient in 1 month to assess BGs and adjust insulin as needed.    Thank you for allowing me to participate in the care of this patient.     Oris Drone, PharmD  PGY-1 [facility] Pharmacy Resident

## 2021-01-04 ENCOUNTER — Other Ambulatory Visit: Payer: Self-pay | Admitting: Primary Care

## 2021-01-04 NOTE — Telephone Encounter (Signed)
Reviewed and agree with recommendations as outlined.  Thank you.  Krystal Eaton, MD

## 2021-01-04 NOTE — Telephone Encounter (Signed)
Last appointment: 10/19/2020  Next appointment: 03/02/2021  Labs: 10/14/2020

## 2021-01-11 ENCOUNTER — Other Ambulatory Visit: Payer: Self-pay | Admitting: Primary Care

## 2021-01-11 NOTE — Telephone Encounter (Signed)
Telemed 12-01-20 and next apt 03-02-21. A1c, CMP, lipids 10/21 and all ordered. Urine microalbumin 4/21.

## 2021-01-11 NOTE — Telephone Encounter (Signed)
Telemed 12-01-20 and next apt 03-02-21.  CMP, A1c, lipids 10/212 and ordered. Urine microalbumin 4/21 and no future order.

## 2021-01-19 ENCOUNTER — Telehealth: Payer: Self-pay | Admitting: Primary Care

## 2021-01-19 NOTE — Telephone Encounter (Signed)
Patient called today, he is a Conservation officer, historic buildings for high school basketball.  This past week he had issues catching his breath when he was running up and down the court.  His wife thinks he might have exercise induced asthma.  He is not having any breathing difficulty now.  He is scheduled to come in tomm afternoon.  He had a cough a few weeks ago but is not having any at this time.  Front or back?

## 2021-01-20 ENCOUNTER — Ambulatory Visit: Payer: No Typology Code available for payment source | Admitting: Internal Medicine

## 2021-01-20 VITALS — BP 140/80 | HR 73 | Temp 97.0°F

## 2021-01-20 DIAGNOSIS — R059 Cough, unspecified: Secondary | ICD-10-CM

## 2021-01-20 DIAGNOSIS — J019 Acute sinusitis, unspecified: Secondary | ICD-10-CM

## 2021-01-20 DIAGNOSIS — R000 Tachycardia, unspecified: Secondary | ICD-10-CM

## 2021-01-20 DIAGNOSIS — R0609 Other forms of dyspnea: Secondary | ICD-10-CM

## 2021-01-20 DIAGNOSIS — R0600 Dyspnea, unspecified: Secondary | ICD-10-CM

## 2021-01-20 MED ORDER — ALBUTEROL SULFATE HFA 108 (90 BASE) MCG/ACT IN AERS *I*
1.0000 | INHALATION_SPRAY | Freq: Four times a day (QID) | RESPIRATORY_TRACT | 0 refills | Status: AC | PRN
Start: 2021-01-20 — End: 2021-07-19

## 2021-01-20 MED ORDER — AMOXICILLIN-POT CLAVULANATE 875-125 MG PO TABS *I*
1.0000 | ORAL_TABLET | Freq: Two times a day (BID) | ORAL | 0 refills | Status: AC
Start: 2021-01-20 — End: 2021-01-30

## 2021-01-20 NOTE — Telephone Encounter (Signed)
Back door please

## 2021-01-20 NOTE — Telephone Encounter (Signed)
lmtcb and left detailed message

## 2021-01-20 NOTE — Progress Notes (Signed)
[facility]  Progress Note    No chief complaint on file.      HPI:  Noah Craig is a 55 y.o. male who presents today for dyspnea on exertion, heart racing.    Right after xmas - didn't feel well, cough. Fever that day. Symptoms were very short lived, only lasted 1 day. He didn't get COVID tested. Hasn't had a fever since, only minor cough when he exerts himself. Was feeling fine.     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 First half was fine but by 2nd half, lungs burning, heart racing. Took a little while to settle down but by the next day he felt fine so he didn't give it much though. thought he was out of shape from not doing it x several weeks.    Out in the cold last weekend, breathing seemed fine with that.     Then Wednesday 1/19 same thing. First half was fine, but by 2nd half burning in lungs, felt hard to get air into the lungs, heart racing, fatigued. Even an hour after the game, HR was still 102. All day yesterday felt like there was fire in his lungs, burning discomfort. Sensation taht he needs to take a good deep breath.     No recent travel or immobility. No leg pain or swelling.     No fevers chills congestion, etc. Minimal cough.     Dad has heart disease, has had clots in his legs but doing well recently     He did have COVID in 2020 and is vaccinated, booster was 1/4.     Wonders if BP is implicated. 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 Wonders if HTN is playing a role in these symptoms     Still with lingering sinus congestion This ongoing since sinusitis back in early December. Augmentin helped a lot but not resolved. Still blocked "annoying" feeling in the sinuses. Thick green discharge, worse in the AM Has been using flonase which does help but not resolving.     BGs have been good - they were high when he was ill after Christmas         MEDICAL PROBLEMS:  Patient Active Problem List   Diagnosis Code     Diabetes mellitus E11.9    Essential hypertension I10    Mixed hyperlipidemia E78.2    Strain of Achilles tendon, right, initial encounter S86.011A        CURRENT MEDICATIONS:    Current Outpatient Medications:     metFORMIN (GLUCOPHAGE-XR) 500 mg 24 hr tablet, TAKE 2 TABLETS BY MOUTH TWO TIMES DAILY (AFTER MEALS), SWALLOW WHOLE, DO NOT CRUSH, BREAK OR CHEW, Disp: 360 tablet, Rfl: 1    insulin lispro (INSULIN LISPRO, 1 UNIT DIAL,) 100 UNIT/ML injection pen, INJECT 5 UNITS SUBCUTANEOUSLY (UNDER THE SKIN) BASELINE WITH MEALS, INJECT 1 ADDITIONAL UNIT PER 40 MG/DL ABOVE 140 MG/DL, DISCARD PEN 28 DAYS AFTER FIRST USE, MAXIMUM DAILY DOSE OF 30 UNITS, Disp: 27 mL, Rfl: 1    JARDIANCE 25 MG tablet, TAKE 1 TABLET BY MOUTH EVERY MORNING, Disp: 90 tablet, Rfl: 1    Insulin Pen Needle (PEN NEEDLES) 31G X 6 MM, Use 3 times a day as instructed., Disp: 200 each, Rfl: 1    Continuous Blood Gluc Receiver (DEXCOM G6 RECEIVER), Use as directed to monitor blood glucose, Disp: 1 each, Rfl: 0    Continuous Blood Gluc Sensor (DEXCOM G6  SENSOR), Apply sensor as directed. Change every 10 days., Disp: 3 each, Rfl: 2    Continuous Blood Gluc Transmit (DEXCOM G6 TRANSMITTER), Attach to sensor. Replace every 3 months., Disp: 1 each, Rfl: 1    glipiZIDE (GLUCOTROL) 10 mg tablet, Take 1 tablet (10 mg total) by mouth daily (with breakfast), Disp: 90 tablet, Rfl: 1    losartan (COZAAR) 25 MG tablet, TAKE 1 TABLET BY MOUTH EVERY DAY **MUST CALL MD FOR APPOINTMENT**, Disp: 90 tablet, Rfl: 1    clindamycin (CLEOCIN T) 1 % lotion, Apply topically 2 times daily  to the following areas: back of neck.  Use twice a day as needed for flares of folliculitis., Disp: 60 mL, Rfl: 0    sildenafil (REVATIO) 20 MG tablet, 2-5 tab daily prn, taken 30-60 minutes before sexual activity, Disp: 40 tablet, Rfl: 5    naproxen sodium (ANAPROX) 220 MG tablet, Take 220 mg by mouth 2 times daily (with meals)   Pt takes as needed, Disp: , Rfl:   Medications  reviewed with patient, reconciled and no changes made today.    ALLERGIES:  Lisinopril, Victoza [liraglutide], Environmental allergies, and No known latex allergy   Allergies reviewed with patient and confirmed today.    EXAMINATION:  Vitals:    01/20/21 1320   BP: 140/80   BP Location: Right arm   Patient Position: Sitting   Cuff Size: large adult   Pulse: 73   Temp: 36.1 C (97 F)   TempSrc: Temporal   SpO2: 99%                                 There is no height or weight on file to calculate BMI.  BP Readings from Last 4 Encounters:   01/20/21 140/80   10/19/20 134/74   07/29/20 128/70   07/15/20 (P) 138/76      Wt Readings from Last 4 Encounters:   12/01/20 122.5 kg (270 lb)   10/19/20 123.3 kg (271 lb 12.8 oz)   07/29/20 120.2 kg (265 lb)   07/15/20 118.8 kg (262 lb)         Physical examination:  General: Well-appearing and well-nourished, nontoxic, no acute distress.  HEENT: Normocephalic, Atraumatic. Normal Conjunctiva. Heart: Regular rate and rhythm no murmurs, rubs or gallops.  Lungs: Breathing is unlabored.  No respiratory distress, respiratory rate normal.  Lungs are clear to auscultation, no wheezes rales or rhonchi.  Neurological: Alert and oriented x3 no focal neurological deficits noted.      ASSESSMENT/PLAN:  1. DOE (dyspnea on exertion)  2. Tachycardia  3. Cough  EKG NSR at 75 beats/minute. No ST-T changes or arrhythmias.  Differential includes reactive airway disease after viral illness, the viral illness could potentially have been COVID but testing not indicated at this time given time since onset of symptoms.  Do think we also need to consider the possibility of PE and reviewed this with him.  I ordered a chest x-ray and laboratory studies including a D-dimer to rule this out but patient declined, stating he did not wish to pursue further work-up based on how good he is feeling otherwise. I cancelled the orders. He does have another game tonight and would like to see how that goes.  I did give  him an albuterol inhaler to have on hand to use if needed.  If he does get relief from that then symptoms are likely due to  reactive airways.  Plan to follow-up with him early next week to see how he did and if symptoms are persisting then he states he will be agreeable to pursuing further work-up.  Of asked him to call or seek care over the weekend for any new or worsening symptoms.  Patient agreeable.    4. Acute sinusitis  Given persistent symptoms, will treat with another course of Augmentin, prescription issued and use and adverse effects reviewed.  He will let us know if symptoms are persistent.      Follow up in March with Dr. [name] as scheduled, sooner should concerns arise.     Collier Bullock, PA on 01/20/2021 1:35 PM

## 2021-01-23 ENCOUNTER — Telehealth: Payer: Self-pay | Admitting: Internal Medicine

## 2021-01-23 NOTE — Telephone Encounter (Signed)
Great news. We'll hold off on further workup then. Let me know if anything changes.

## 2021-01-23 NOTE — Telephone Encounter (Signed)
Pt had shortness of breath at the end of first half he used inhaler and felt great the rest of the game

## 2021-01-23 NOTE — Telephone Encounter (Signed)
Please call patient - how was his breathing with the game on Friday?     If he's still having the same symptoms, we should check labs, x-ray, check a d-dimer test (blood test) for clot

## 2021-01-23 NOTE — Telephone Encounter (Signed)
Pt states he has a game tonight if he has any issues he will let us know

## 2021-02-13 ENCOUNTER — Ambulatory Visit: Payer: No Typology Code available for payment source | Admitting: Internal Medicine

## 2021-02-13 ENCOUNTER — Other Ambulatory Visit
Admission: RE | Admit: 2021-02-13 | Discharge: 2021-02-13 | Disposition: A | Discharge status: Home / Self Care | Payer: No Typology Code available for payment source | Source: Ambulatory Visit | Attending: Internal Medicine | Admitting: Internal Medicine

## 2021-02-13 ENCOUNTER — Encounter: Payer: Self-pay | Admitting: Internal Medicine

## 2021-02-13 ENCOUNTER — Ambulatory Visit
Admission: RE | Admit: 2021-02-13 | Discharge: 2021-02-13 | Disposition: A | Discharge status: Home / Self Care | Payer: No Typology Code available for payment source | Source: Ambulatory Visit | Attending: Internal Medicine | Admitting: Internal Medicine

## 2021-02-13 VITALS — BP 140/76 | HR 65 | Temp 97.5°F

## 2021-02-13 DIAGNOSIS — E119 Type 2 diabetes mellitus without complications: Secondary | ICD-10-CM | POA: Insufficient documentation

## 2021-02-13 DIAGNOSIS — R059 Cough, unspecified: Secondary | ICD-10-CM

## 2021-02-13 DIAGNOSIS — R079 Chest pain, unspecified: Secondary | ICD-10-CM | POA: Insufficient documentation

## 2021-02-13 DIAGNOSIS — R0609 Other forms of dyspnea: Secondary | ICD-10-CM

## 2021-02-13 DIAGNOSIS — R0600 Dyspnea, unspecified: Secondary | ICD-10-CM | POA: Insufficient documentation

## 2021-02-13 LAB — CBC AND DIFFERENTIAL
Baso # K/uL: 0 10*3/uL (ref 0.0–0.1)
Basophil %: 0.3 %
Eos # K/uL: 0.2 10*3/uL (ref 0.0–0.5)
Eosinophil %: 2.2 %
Hematocrit: 45 % (ref 40–51)
Hemoglobin: 15.4 g/dL (ref 13.7–17.5)
IMM Granulocytes #: 0 10*3/uL (ref 0.0–0.0)
IMM Granulocytes: 0.6 %
Lymph # K/uL: 1.8 10*3/uL (ref 1.3–3.6)
Lymphocyte %: 25.4 %
MCH: 30 pg (ref 26–32)
MCHC: 34 g/dL (ref 32–37)
MCV: 88 fL (ref 79–92)
Mono # K/uL: 0.5 10*3/uL (ref 0.3–0.8)
Monocyte %: 6.9 %
Neut # K/uL: 4.5 10*3/uL (ref 1.8–5.4)
Nucl RBC # K/uL: 0 10*3/uL (ref 0.0–0.0)
Nucl RBC %: 0 /100 WBC (ref 0.0–0.2)
Platelets: 166 10*3/uL (ref 150–330)
RBC: 5.2 MIL/uL (ref 4.6–6.1)
RDW: 12.5 % (ref 11.6–14.4)
Seg Neut %: 64.6 %
WBC: 6.9 10*3/uL (ref 4.2–9.1)

## 2021-02-13 LAB — COMPREHENSIVE METABOLIC PANEL
ALT: 41 U/L (ref 0–50)
AST: 26 U/L (ref 0–50)
Albumin: 4.3 g/dL (ref 3.5–5.2)
Alk Phos: 90 U/L (ref 40–130)
Anion Gap: 11 (ref 7–16)
Bilirubin,Total: 0.3 mg/dL (ref 0.0–1.2)
CO2: 24 mmol/L (ref 20–28)
Calcium: 9.6 mg/dL (ref 8.6–10.2)
Chloride: 106 mmol/L (ref 96–108)
Creatinine: 1.2 mg/dL — ABNORMAL HIGH (ref 0.67–1.17)
GFR,Black: 79 *
GFR,Caucasian: 68 *
Glucose: 183 mg/dL — ABNORMAL HIGH (ref 60–99)
Lab: 21 mg/dL — ABNORMAL HIGH (ref 6–20)
Potassium: 4.7 mmol/L (ref 3.3–5.1)
Sodium: 141 mmol/L (ref 133–145)
Total Protein: 6.7 g/dL (ref 6.3–7.7)

## 2021-02-13 LAB — LIPID PANEL
Chol/HDL Ratio: 2.6
Cholesterol: 109 mg/dL
HDL: 42 mg/dL (ref 40–60)
LDL Calculated: 39 mg/dL
Non HDL Cholesterol: 67 mg/dL
Triglycerides: 141 mg/dL

## 2021-02-13 LAB — MULTIPLE ORDERING DOCS

## 2021-02-13 LAB — D-DIMER, QUANTITATIVE: D-Dimer: 0.43 ug/mL FEU (ref 0.00–0.50)

## 2021-02-13 LAB — TSH: TSH: 2.41 u[IU]/mL (ref 0.27–4.20)

## 2021-02-13 NOTE — Progress Notes (Signed)
[facility]  Progress Note    Chief Complaint   Patient presents with    Shortness of Breath    Chest Pain    Numbness     Left fingertips       HPI:  Noah Craig is a 55 y.o. male who presents today for continued chest pain, shortness of breath, progressively worsening.    I saw him for this last month - started after a URI (potentially COVID, was never tested) then COVID booster. Refereed a game and noticed he became short of breath. This pattern continued with each game, progressively worsening.    I saw him 1/21. EKG was unremarkable. I wanted to pursue further workup including labs, D-dimer and CXR but he declined at that time, wanted to try an inhaler. Initially, he reported good results with inhaler but comes back in today as things have continued to worsen.     Friday night 2/11 had to use the inhaler just 5 minutes into the game. Did help temporarily, but then had to use it again in the 2nd half.     Over the weekend, started to feel "wiped out" - out of breath with minimal exertion both yesterday and today. This morning was driving and really felt completed wiped out. Had chest pain, shortness of breath, tingling in finger tips on left hand.     He denies any leg pain or swelling, nausea, diaphoresis, lightheadedness, jaw pain, arm pain, etc.     His supportive wife is here today and notes his father did have DVTs. No heart disease they are aware of.    He notes "light" chest pains on the left side - he can even feel that right now still. Not tender to touch.     BGs have been fine - 150s to 170s over the weekend.     No cough at rest but notes he does get coughing when he exerts himself.    Notes this is the exact same way as he felt for months after COVID in 2020.     Does sometimes feel his heart racing and notes it takes a long time for his heart rate to recover after he exerts himself. An hour after the game was over, HR was still in the 100s which is unusual for him.     Sinus  symptoms all cleared up.           MEDICAL PROBLEMS:  Patient Active Problem List   Diagnosis Code    Diabetes mellitus E11.9    Essential hypertension I10    Mixed hyperlipidemia E78.2    Strain of Achilles tendon, right, initial encounter S86.011A        CURRENT MEDICATIONS:    Current Outpatient Medications:     metFORMIN (GLUCOPHAGE-XR) 500 mg 24 hr tablet, TAKE 2 TABLETS BY MOUTH TWO TIMES DAILY (AFTER MEALS), SWALLOW WHOLE, DO NOT CRUSH, BREAK OR CHEW, Disp: 360 tablet, Rfl: 1    insulin lispro (INSULIN LISPRO, 1 UNIT DIAL,) 100 UNIT/ML injection pen, INJECT 5 UNITS SUBCUTANEOUSLY (UNDER THE SKIN) BASELINE WITH MEALS, INJECT 1 ADDITIONAL UNIT PER 40 MG/DL ABOVE 140 MG/DL, DISCARD PEN 28 DAYS AFTER FIRST USE, MAXIMUM DAILY DOSE OF 30 UNITS, Disp: 27 mL, Rfl: 1    JARDIANCE 25 MG tablet, TAKE 1 TABLET BY MOUTH EVERY MORNING, Disp: 90 tablet, Rfl: 1    Insulin Pen Needle (PEN NEEDLES) 31G X 6 MM, Use 3 times a day as instructed., Disp: 200 each,  Rfl: 1    Continuous Blood Gluc Receiver (DEXCOM G6 RECEIVER), Use as directed to monitor blood glucose, Disp: 1 each, Rfl: 0    Continuous Blood Gluc Sensor (DEXCOM G6 SENSOR), Apply sensor as directed. Change every 10 days., Disp: 3 each, Rfl: 2    Continuous Blood Gluc Transmit (DEXCOM G6 TRANSMITTER), Attach to sensor. Replace every 3 months., Disp: 1 each, Rfl: 1    glipiZIDE (GLUCOTROL) 10 mg tablet, Take 1 tablet (10 mg total) by mouth daily (with breakfast), Disp: 90 tablet, Rfl: 1    losartan (COZAAR) 25 MG tablet, TAKE 1 TABLET BY MOUTH EVERY DAY **MUST CALL MD FOR APPOINTMENT**, Disp: 90 tablet, Rfl: 1    naproxen sodium (ANAPROX) 220 MG tablet, Take 220 mg by mouth 2 times daily (with meals)   Pt takes as needed, Disp: , Rfl:     albuterol HFA (PROVENTIL, VENTOLIN, PROAIR HFA) 108 (90 Base) MCG/ACT inhaler, Inhale 1-2 puffs into the lungs every 6 hours as needed for Wheezing  Shake well before each use., Disp: 1 each, Rfl: 0    clindamycin  (CLEOCIN T) 1 % lotion, Apply topically 2 times daily  to the following areas: back of neck.  Use twice a day as needed for flares of folliculitis., Disp: 60 mL, Rfl: 0    sildenafil (REVATIO) 20 MG tablet, 2-5 tab daily prn, taken 30-60 minutes before sexual activity, Disp: 40 tablet, Rfl: 5  Medications reviewed with patient, reconciled and no changes made today.    ALLERGIES:  Lisinopril, Victoza [liraglutide], Environmental allergies, and No known latex allergy   Allergies reviewed with patient and confirmed today.    EXAMINATION:  Vitals:    02/13/21 1424   BP: 140/76   Pulse: 65   Temp: 36.4 C (97.5 F)   TempSrc: Temporal   SpO2: 98%                                 There is no height or weight on file to calculate BMI.  BP Readings from Last 4 Encounters:   02/13/21 140/76   01/20/21 140/80   10/19/20 134/74   07/29/20 128/70      Wt Readings from Last 4 Encounters:   12/01/20 122.5 kg (270 lb)   10/19/20 123.3 kg (271 lb 12.8 oz)   07/29/20 120.2 kg (265 lb)   07/15/20 118.8 kg (262 lb)         Physical examination:  General: Well-appearing and well-nourished, nontoxic, no acute distress.  HEENT: Normocephalic, Atraumatic. Normal Conjunctiva.   Heart: Regular rate and rhythm no murmurs, rubs or gallops.  Lungs: Breathing is unlabored.  No respiratory distress, respiratory rate normal.  Lungs are clear to auscultation, no wheezes rales or rhonchi.  Chest wall without any swelling or tenderness  Neurological: Alert and oriented x3 no focal neurological deficits noted.      ASSESSMENT/PLAN:  1. DOE (dyspnea on exertion)  2. Chest pain  3. Cough  EKG is again reassuring today, normal sinus rhythm at 59 bpm without any ST/T changes or arrhythmias.  It is unchanged from his previous EKG.  He is now agreeable to moving forward with further work-up.  I have ordered chest x-ray and labs as below.  Given his symptoms of been progressively worsening over the past 48 hours, I did advise that he will need to go to ED  if D-dimer is positive.  He and  his wife are aware of this and are in agreement.  If labs and chest x-ray all look okay then we will plan to move forward with a stress test.  If that looks okay then I suspect that this is just related to recent viral illness that was likely Covid and we will need to continue with supportive measures.  Reactive airways is also in the differential though I think less likely given his symptoms over the past 48 hours.- CBC and differential; Future  - Comprehensive metabolic panel; Future  - TSH; Future  - D-dimer, quantitative; Future  - *Chest standard frontal and lateral views; Future      Follow up in March with Dr. [name] as scheduled, sooner should concerns arise.     Collier Bullock, PA on 02/13/2021 2:38 PM

## 2021-02-13 NOTE — Patient Instructions (Signed)
Your EKG looked okay today.   I want to get a chest x-ray and blood work.  One of the tests is a d-dimer test. If that come back abnormal (elevated) then we should have you go to emergency for a CT scan of the lungs.  If that looks okay, then we'll plan to get a stress test done ASAP.     Rest and take it easy while we're waiting on all of this. No exercise.      Nearby UR Lab Locations:  [facility]  [address], [place]  [address] ([address] [place] [address]  Mon-Fri: 7:30 a.m. - 4:30 p.m.    [facility]  [address]  [place], [place]  [address]  M-F: 8:00 a.m. - 5:00 p.m.  Closed for Lunch: 12:00 p.m. - 1:00 p.m.  Sat: 8:00 a.m. - 12:00 p.m.

## 2021-02-14 ENCOUNTER — Other Ambulatory Visit: Payer: Self-pay | Admitting: Primary Care

## 2021-02-14 ENCOUNTER — Ambulatory Visit
Admission: RE | Admit: 2021-02-14 | Discharge: 2021-02-14 | Disposition: A | Discharge status: Home / Self Care | Payer: No Typology Code available for payment source | Source: Ambulatory Visit | Attending: Cardiology | Admitting: Cardiology

## 2021-02-14 ENCOUNTER — Telehealth: Payer: Self-pay | Admitting: Internal Medicine

## 2021-02-14 ENCOUNTER — Telehealth: Payer: Self-pay

## 2021-02-14 DIAGNOSIS — E119 Type 2 diabetes mellitus without complications: Secondary | ICD-10-CM

## 2021-02-14 DIAGNOSIS — R0609 Other forms of dyspnea: Secondary | ICD-10-CM

## 2021-02-14 DIAGNOSIS — R0600 Dyspnea, unspecified: Secondary | ICD-10-CM

## 2021-02-14 DIAGNOSIS — R079 Chest pain, unspecified: Secondary | ICD-10-CM

## 2021-02-14 LAB — EXERCISE STRESS ECHO COMPLETE
Aortic Diameter (mid tubular): 3 cm
Aortic Diameter (sinus of Valsalva): 3.3 cm
BMI: 34.7 kg/m2
BP Diastolic: 78 mmHg
BP Systolic: 168 mmHg
BSA: 2.53 m2
Deceleration Time - MV: 182 ms
E/A ratio: 1.01
ECG PR interval: 180 ms
ECG QRS interval: 80 ms
Estimated workload: 10 METS
Heart Rate: 64 {beats}/min
Height: 74 in
LA Systolic Diameter: 4.12 cm
LA Systolic Vol BSA Index: 22.1 mL/m2
LA Systolic Vol Height Index: 29.8 mL/m
LA Systolic Volume: 56 mL
LV ASE Mass BSA Index: 72.4 gm/m2
LV ASE Mass Height 2.7 Index: 33.3 gm/m2.7
LV ASE Mass Height Index: 97.5 gm/m
LV ASE Mass: 183.2 gm
LV CO BSA Index: 2.38 L/min/m2
LV Cardiac Output: 6.02 L/min
LV Diastolic Volume Index: 56.9 mL/m2
LV Posterior Wall Thickness: 1.01 cm
LV SV - LVOT SV Diff: 21.65 mL
LV SV - LVOT SV Diff: 23.03 %
LV SV BSA Index: 37.2 mL/m2
LV SV Height Index: 50 mL/m
LV Septal Thickness: 1.06 cm
LV Stroke Volume: 94 mL
LV Systolic Volume Index: 19.8 mL/m2
LV wall/cavity ratio: 0.42
LVED Diameter BSA Index: 1.93 cm/m2
LVED Diameter Height Index: 2.6 cm/m
LVED Diameter: 4.88 cm
LVED Volume BSA Index: 56.9 mL/m2
LVED Volume BSA Index: 57 ml/m2
LVED Volume Height Index: 76.6 mL/m
LVED Volume: 144 mL
LVEF (Volume): 65 %
LVES Volume BSA Index: 19.8 mL/m2
LVES Volume BSA Index: 20 ml/m2
LVES Volume Height Index: 26.6 mL/m
LVES Volume: 50 mL
LVOT Area (calculated): 3.46 cm2
LVOT Cardiac Index: 1.83 L/min/m2
LVOT Cardiac Output: 4.63 L/min
LVOT Diameter: 2.1 cm
LVOT PWD VTI: 20.9 cm
LVOT PWD Velocity (mean): 69.4 cm/s
LVOT PWD Velocity (peak): 96.6 cm/s
LVOT SV BSA Index: 28.6 mL/m2
LVOT SV Height Index: 38.5 mL/m
LVOT Stroke Rate (mean): 240.3 mL/s
LVOT Stroke Rate (peak): 334.4 mL/s
LVOT Stroke Volume: 72.35 cc
MPHR: 167 {beats}/min
MR Regurgitant Fraction (LV SV Mtd): 0.23
MR Regurgitant Volume (LV SV Mtd): 21.6 mL
MV Peak A Velocity: 82.8 cm/s
MV Peak E Velocity: 84 cm/s
Mitral Annular E/Ea Vel Ratio: 13.55
Mitral Annular Ea Velocity: 6.2 cm/s
Peak DBP: 80 mmHg
Peak HR: 144 {beats}/min
Peak SBP: 206 mmHg
Percent MPHR: 86.2 %
RA Volume BSA Index: 13.8 mL/m2
RA Volume Height Index: 18.6 mL/m
RA Volume: 35 mL
RPP: 29664 BPM x mmHG
RR Interval: 937.5 ms
Stress Peak Stage: 3.01
Stress duration (min): 9 min
Stress duration (sec): 2 s
Weight (lbs): 270 [lb_av]
Weight: 4320 oz

## 2021-02-14 LAB — HEMOGLOBIN A1C: Hemoglobin A1C: 6.2 % — ABNORMAL HIGH

## 2021-02-14 MED ORDER — PERFLUTREN PROTEIN A MICROSPH (OPTISON) IV SUSP *I*
1.5000 mL | INTRAVENOUS | Status: AC | PRN
Start: 2021-02-14 — End: 2021-02-14
  Administered 2021-02-14 (×2): 1.5 mL via INTRAVENOUS
  Filled 2021-02-14: qty 3

## 2021-02-14 NOTE — Discharge Instructions (Signed)
Pt is here for an exercise stress echocardiogram. Instructed to follow up with ordering provider. Pt verbalized understanding.

## 2021-02-14 NOTE — Telephone Encounter (Signed)
Patient is scheduled today at 2:30 for a stress echo. Does this need auth?

## 2021-02-14 NOTE — Telephone Encounter (Signed)
[facility]    02/13/21     [facility]  03/02/21     [facility]   02/13/21

## 2021-02-14 NOTE — Telephone Encounter (Signed)
Please advise patient great news - the stress test looked okay. I suspect his symptoms are due to [place] in late December and will likely slowly resolve over the next few months. The next step would be to have him go for pulmonary function tests to look for any evidence of asthma or other lung disease. How is he feeling? Does he want to give this some more time or would he like to move forward with the pulmonary function tests?

## 2021-02-14 NOTE — Telephone Encounter (Signed)
Patient given information, had questions about what to wear for the testing, advised to call cardiology and they can up date him

## 2021-02-14 NOTE — Telephone Encounter (Signed)
Please advise pt he is scheduled for a stress test today 2:30pm [address] [place] [phone]    This was ordered stat

## 2021-02-14 NOTE — Telephone Encounter (Signed)
I am ordering a stress test for this patient - I ordered it STAT - he lives in [place] so let's see if we can get him in down there sooner rather than later. He is aware we are going to be ordering this.

## 2021-02-14 NOTE — Telephone Encounter (Signed)
BC-NO AUTH NEEDED

## 2021-02-15 MED ORDER — PEN NEEDLES 31G X 6 MM MISC *A*
1 refills | Status: DC
Start: 2021-02-15 — End: 2021-06-20

## 2021-02-15 NOTE — Telephone Encounter (Signed)
Pt. Made aware and reports feeling good today.  He would like to wait and give this more time.  He is aware to call office with persistent/worsening sx.

## 2021-02-16 NOTE — Telephone Encounter (Signed)
Noted  

## 2021-02-25 ENCOUNTER — Other Ambulatory Visit: Payer: Self-pay | Admitting: Primary Care

## 2021-02-27 NOTE — Telephone Encounter (Signed)
Telemed 12-01-20 and next apt 03-02-21.  CMP, A1c, lipids 2/22.

## 2021-03-02 ENCOUNTER — Ambulatory Visit: Payer: No Typology Code available for payment source | Admitting: Primary Care

## 2021-03-02 ENCOUNTER — Telehealth: Payer: Self-pay | Admitting: Primary Care

## 2021-03-02 ENCOUNTER — Encounter: Payer: Self-pay | Admitting: Primary Care

## 2021-03-02 VITALS — Ht 74.0 in | Wt 278.0 lb

## 2021-03-02 DIAGNOSIS — E119 Type 2 diabetes mellitus without complications: Secondary | ICD-10-CM

## 2021-03-02 LAB — PCMH DEPRESSION ASSESSMENT

## 2021-03-02 MED ORDER — DULAGLUTIDE 0.75 MG/0.5ML SC SOAJ *I*
0.7500 mg | SUBCUTANEOUS | 0 refills | Status: DC
Start: 2021-03-02 — End: 2021-03-15

## 2021-03-02 NOTE — Progress Notes (Signed)
Video Visit     Location of Patient: home    Location of Telemedicine Provider: hospital / clinical location    Other participants in telemedicine encounter and roles:  n/a    This is an established patient visit.    Reason for visit: Follow-up (DM)      HPI    DM:  A1c came back great at 6.2 though he "doesn't know if he believes it or not."  His reader at home indicates he's at 7.1 over 90 days (dexcom) which takes his readings ever 3-5 minutes.  He does notice some weight gain (>10 lb) since starting insulin.  He takes glipizide 10 mg once a day, metformin 1,000 mg BID, jardiance 25 mg daily, and lispro 5 units with breakfast, 15 units with lunch and dinner, with 1:40>140 scale beyond that.  No morning hyperglycemia.  He is learning how much insulin to expect his meals to require.  In range 74% of the time.      ROS    Still having persistent dyspnea and fatigue after activity.  Suspects he had covid.      Patient's problem list, allergies, and medications were reviewed and updated as appropriate.  Please see the EHR for full details.    Exam and data reviewed:  Gen: Alert and oriented, NAD.  Pulm: Breathing comfortably at rest without accessory muscle use.  Cardiac: Normal coloration.    Neuro: Interactive, able to provide detailed history, Normal speech and mood.      Assessment & Plan:    1. Diabetes mellitus   - A1c on labs looks great where it is though he would like to see about getting his weight down with regard to the insulin.  Starting dose of trulicity sent in.  Advised to monitor for GI upset which he had with victoza.  Ok to stop glipizide, reduce nutritional insulin to 5 units with every meal, keep sliding scale the same.  Continue the jardiance and the metformin at current doses.  Will ask our pharmacist to touch base with patient in a couple of weeks to touch base with him as well.       Follow up end of May.      Consent was obtained from the patient to complete this video visit; including the  potential for financial liability.    Krystal Eaton, MD

## 2021-03-02 NOTE — Telephone Encounter (Signed)
Patient due for diabetic eye exam this month - he will get it scheduled.

## 2021-03-15 ENCOUNTER — Other Ambulatory Visit: Payer: Self-pay | Admitting: Primary Care

## 2021-03-15 NOTE — Telephone Encounter (Signed)
LOV - 03-02-21  NOV - 05-25-21  LABS - 02-13-21

## 2021-03-20 ENCOUNTER — Encounter: Payer: Self-pay | Admitting: Ambulatory Care

## 2021-03-20 NOTE — Telephone Encounter (Signed)
Clinical Pharmacy Note:    Contacted patient via MyChart message to assess tolerability of Trulicity/plan for dose titration and review BG's.     Thank you for allowing me to participate in the care of this patient.     Osie Bond, PharmD, BCACP, BC-ADM  Clinical Pharmacy Specialist  [facility] / [facility]  [phone]

## 2021-03-28 MED ORDER — DULAGLUTIDE 1.5 MG/0.5ML SC SOAJ *I*
1.5000 mg | SUBCUTANEOUS | 1 refills | Status: DC
Start: 2021-03-28 — End: 2021-05-11

## 2021-03-28 NOTE — Addendum Note (Signed)
Addended by: Verda Cumins on: 03/28/2021 03:06 PM     Modules accepted: Orders

## 2021-03-28 NOTE — Telephone Encounter (Signed)
Clinical Pharmacy Note:    Called patient to review BG's and assess tolerability of Trulicity. Patient reports he is doing well, has now taken 4 doses of Trulicity 8.25 mg weekly and is tolerating well, denies any side effects or concerns.    Reports BG's have been good since starting the Trulicity and stopping the glipizide. Due for increase to maintenance dose of Trulicity - pended new order for PCP signature.    Patient has FUV with PCP on 05/25/21 - pharmacist will see patient during this visit and plan for additional follow-up PRN.    Thank you for allowing me to participate in the care of this patient.     Osie Bond, PharmD, BCACP, BC-ADM  Clinical Pharmacy Specialist  [facility]  [phone]

## 2021-03-28 NOTE — Telephone Encounter (Signed)
Reviewed and agree.  New rx sent for 1.5 mg dose of trulicity.  Krystal Eaton, MD

## 2021-04-13 ENCOUNTER — Other Ambulatory Visit: Payer: Self-pay | Admitting: Primary Care

## 2021-04-13 DIAGNOSIS — E119 Type 2 diabetes mellitus without complications: Secondary | ICD-10-CM

## 2021-04-13 NOTE — Telephone Encounter (Signed)
LOV - 03-02-21  NOV - 05-25-21  LABS - 02-13-21

## 2021-04-17 ENCOUNTER — Other Ambulatory Visit: Payer: Self-pay | Admitting: Primary Care

## 2021-04-17 DIAGNOSIS — E119 Type 2 diabetes mellitus without complications: Secondary | ICD-10-CM

## 2021-04-17 NOTE — Telephone Encounter (Signed)
lov 03/02/21  fuv 05/25/21  Labs 02/14/21, future ordered

## 2021-04-25 ENCOUNTER — Telehealth: Payer: Self-pay | Admitting: Primary Care

## 2021-04-25 NOTE — Telephone Encounter (Signed)
Patient due for diabetic eye exam.  MyChart reminder sent.

## 2021-05-02 ENCOUNTER — Encounter: Payer: Self-pay | Admitting: Gastroenterology

## 2021-05-11 ENCOUNTER — Other Ambulatory Visit: Payer: Self-pay | Admitting: Primary Care

## 2021-05-11 NOTE — Telephone Encounter (Signed)
Telemed 03-02-21 and next apt 05-25-21.   A1c, lipids, CMP 2/22.  BMP, A1c, urine microalbumin ordered.

## 2021-05-23 ENCOUNTER — Other Ambulatory Visit: Payer: Self-pay | Admitting: Primary Care

## 2021-05-23 NOTE — Telephone Encounter (Signed)
Telemed 03-02-21 and next apt 05-25-21.  CMP, lipids A1c 2/22.  A1c BMP and urine microalbumin ordered.

## 2021-05-25 ENCOUNTER — Ambulatory Visit: Payer: No Typology Code available for payment source | Admitting: Primary Care

## 2021-05-25 VITALS — BP 108/64 | HR 80 | Temp 97.2°F | Ht 74.0 in | Wt 267.2 lb

## 2021-05-25 DIAGNOSIS — E119 Type 2 diabetes mellitus without complications: Secondary | ICD-10-CM

## 2021-05-25 DIAGNOSIS — I10 Essential (primary) hypertension: Secondary | ICD-10-CM

## 2021-05-25 LAB — PCMH DEPRESSION ASSESSMENT

## 2021-05-25 NOTE — Progress Notes (Signed)
[facility]  Outpatient Visit Note       Reason For Visit:   Chief Complaint   Patient presents with    Follow-up     DM, HTN       Subjective:      Noah Craig is a 55 y.o. man with diabetes, hypertension, hyperlipidemia, presenting for follow-up.  Issues discussed below.    DM:  Blood sugars have been under excellent control.  Over the past 90 days 84% have been in range.  Over the last month 87% have been in range.  He takes Jardiance 25 mg daily, Trulicity 1.5 mg weekly, metformin 1000 mg twice a day, and lispro with meals.  His lispro doses are typically 5 units to 10 units, and 10 units, and less he is taking something like pasta or pizza, in which case he takes 12 units with meals.  His weight is decreased and he remains very active with baseball and competitive shooting.    HTN:  Pressure today is excellent at 108/64.  He takes a low-dose of losartan 25 mg/day.  He will be due to update lab work to monitor his renal function and electrolytes.    Home Medications:     Prior to Admission medications    Medication Sig Start Date End Date Taking? Authorizing Provider   JARDIANCE 25 MG tablet TAKE 1 TABLET BY MOUTH EVERY MORNING 05/23/21  Yes Krystal Eaton, MD   TRULICITY 1.5 MW/4.1LK pen INJECT 1.5MG  SUBCUTANEOUSLY (UNDER THE SKIN) EVERY 7 DAYS 05/11/21  Yes Krystal Eaton, MD   Continuous Blood Gluc Sensor (DEXCOM G6 SENSOR) APPLY SENSOR AS DIRECTED - CHANGE EVERY 10 DAYS 04/17/21  Yes Collier Bullock, PA   Continuous Blood Gluc Transmit (DEXCOM G6 TRANSMITTER) ATTACH TO SENSOR. REPLACE EVERY 3 MONTHS 04/13/21  Yes Krystal Eaton, MD   losartan (COZAAR) 25 mg tablet Take 1 tablet (25 mg total) by mouth daily 02/27/21  Yes Krystal Eaton, MD   Insulin Pen Needle (PEN NEEDLES) 31G X 6 MM Use 3 times a day as instructed. 02/15/21  Yes Krystal Eaton, MD   albuterol HFA (PROVENTIL, VENTOLIN, PROAIR HFA) 108 (90 Base) MCG/ACT inhaler Inhale 1-2 puffs into the lungs every 6 hours as  needed for Wheezing  Shake well before each use. 01/20/21 07/19/21 Yes Philis Nettle, Beverely Risen, PA   metFORMIN (GLUCOPHAGE-XR) 500 mg 24 hr tablet TAKE 2 TABLETS BY MOUTH TWO TIMES DAILY (AFTER MEALS), SWALLOW WHOLE, DO NOT CRUSH, BREAK OR CHEW 01/11/21  Yes Krystal Eaton, MD   insulin lispro (INSULIN LISPRO, 1 UNIT DIAL,) 100 UNIT/ML injection pen INJECT 5 UNITS SUBCUTANEOUSLY (UNDER THE SKIN) BASELINE WITH MEALS, INJECT 1 ADDITIONAL UNIT PER 40 MG/DL ABOVE 140 MG/DL, DISCARD PEN 28 DAYS AFTER FIRST USE, MAXIMUM DAILY DOSE OF 30 UNITS 01/04/21  Yes Krystal Eaton, MD   Continuous Blood Gluc Receiver (DEXCOM G6 RECEIVER) Use as directed to monitor blood glucose 10/19/20  Yes Krystal Eaton, MD   clindamycin (CLEOCIN T) 1 % lotion Apply topically 2 times daily  to the following areas: back of neck.  Use twice a day as needed for flares of folliculitis. 10/19/20  Yes Krystal Eaton, MD   sildenafil (REVATIO) 20 MG tablet 2-5 tab daily prn, taken 30-60 minutes before sexual activity 10/01/19  Yes Penni Bombard, MD   naproxen sodium (ANAPROX) 220 MG tablet Take 220 mg by mouth 2 times daily (with meals)   Pt takes as needed   Yes [provider]  Allergies:     Allergies   Allergen Reactions    Lisinopril Cough    Victoza [Liraglutide] Other (See Comments)     GI upset    Environmental Allergies Other (See Comments)     Sneezing      No Known Latex Allergy      Created by Conversion - 0;      Review of Systems:     As detailed above by problem.     Physical Exam:  Temp Readings from Last 3 Encounters:   05/25/21 36.2 C (97.2 F) (Temporal)   02/13/21 36.4 C (97.5 F) (Temporal)   01/20/21 36.1 C (97 F) (Temporal)     BP Readings from Last 3 Encounters:   05/25/21 108/64   02/14/21 168/78   02/13/21 140/76     Pulse Readings from Last 3 Encounters:   05/25/21 80   02/14/21 64   02/13/21 65         Vitals:    05/25/21 1514   BP: 108/64   BP Location: Right arm   Patient Position: Sitting   Cuff  Size: large adult   Pulse: 80   Temp: 36.2 C (97.2 F)   TempSrc: Temporal   SpO2: 98%   Weight: 121.2 kg (267 lb 3.2 oz)   Height: 1.88 m (6\' 2" )     Wt Readings from Last 3 Encounters:   05/25/21 121.2 kg (267 lb 3.2 oz)   03/02/21 126.1 kg (278 lb)   02/14/21 122.5 kg (270 lb)     General: Well-developed male, in no acute distress.  Lungs: Breathing comfortably at rest.  Lungs CTAB without adventitious sounds.  Cardiovascular: RRR no m/r/g.  Extremities warm and dry to touch.   Extremities: Diabetic foot exam: Readily palpable DP and PT pulses bilaterally.  Skin intact without ulceration. 10/10 monofilament sites tested intact bilaterally.  Neurologic: Fully interactive and cooperative with exam.  Able to provide an accurate history.  Speech and mood appropriate.      ASSESSMENT and Plan:     55 y.o. male with diabetes, hypertension, hyperlipidemia, presenting for follow-up.  Plan by problem below.    1. Primary hypertension   -At goal.  Continue losartan 25 mg daily.   2. Type 2 diabetes mellitus   -Expecting next A1c to be at goal.  Continue current medications as is for now.  Depending on how low the A1c is, would prefer to reduce his metformin first, and then if needed can reduce the insulin to 5, 7, 7 units with meals.     Follow-up again in 6 months, labs ordered for them as well.    Krystal Eaton, MD 05/25/2021 3:31 PM

## 2021-05-26 ENCOUNTER — Other Ambulatory Visit
Admission: RE | Admit: 2021-05-26 | Discharge: 2021-05-26 | Disposition: A | Discharge status: Home / Self Care | Payer: No Typology Code available for payment source | Source: Ambulatory Visit | Attending: Primary Care | Admitting: Primary Care

## 2021-05-26 DIAGNOSIS — E119 Type 2 diabetes mellitus without complications: Secondary | ICD-10-CM | POA: Insufficient documentation

## 2021-05-26 LAB — BASIC METABOLIC PANEL
Anion Gap: 14 (ref 7–16)
CO2: 22 mmol/L (ref 20–28)
Calcium: 9.8 mg/dL (ref 8.6–10.2)
Chloride: 104 mmol/L (ref 96–108)
Creatinine: 1.16 mg/dL (ref 0.67–1.17)
Glucose: 169 mg/dL — ABNORMAL HIGH (ref 60–99)
Lab: 18 mg/dL (ref 6–20)
Potassium: 4.6 mmol/L (ref 3.3–5.1)
Sodium: 140 mmol/L (ref 133–145)
eGFR BY CREAT: 75 *

## 2021-05-26 LAB — MICROALBUMIN, URINE, RANDOM
Creatinine,UR: 114 mg/dL (ref 20–300)
Microalbumin,UR: <1.2 mg/dL

## 2021-05-26 LAB — HEMOGLOBIN A1C: Hemoglobin A1C: 6.2 % — ABNORMAL HIGH

## 2021-06-04 ENCOUNTER — Other Ambulatory Visit: Payer: Self-pay | Admitting: Primary Care

## 2021-06-04 MED ORDER — METFORMIN HCL 500 MG PO TB24 *I*
ORAL_TABLET | ORAL | 1 refills | Status: DC
Start: 2021-06-04 — End: 2021-06-05

## 2021-06-05 ENCOUNTER — Telehealth: Payer: Self-pay | Admitting: Primary Care

## 2021-06-05 MED ORDER — METFORMIN HCL 500 MG PO TB24 *I*
ORAL_TABLET | ORAL | 1 refills | Status: DC
Start: 2021-06-05 — End: 2021-07-04

## 2021-06-05 NOTE — Telephone Encounter (Signed)
I called and spoke with patient - reviewed Dr Winfield Cunas message below with patient.    Patient did go to one in the morning and one in the evening.  He needed to bump his insulin a little.  He will start the new dosing.

## 2021-06-05 NOTE — Telephone Encounter (Signed)
Okay thank you for confirming.  Krystal Eaton, MD

## 2021-06-05 NOTE — Telephone Encounter (Signed)
-----   Message from Krystal Eaton, MD sent at 06/04/2021 11:17 AM EDT -----  Please let patient know I recommend reducing the metformin to 2 tabs in the AM and 1 tab in the PM based on his recent A1c.  The urine test came back normal.  Thanks for getting these checked.

## 2021-06-20 ENCOUNTER — Other Ambulatory Visit: Payer: Self-pay | Admitting: Primary Care

## 2021-06-20 DIAGNOSIS — E119 Type 2 diabetes mellitus without complications: Secondary | ICD-10-CM

## 2021-06-20 MED ORDER — PEN NEEDLES 31G X 6 MM MISC *A*
1 refills | Status: DC
Start: 2021-06-20 — End: 2021-06-20

## 2021-06-20 MED ORDER — PEN NEEDLES 31G X 6 MM MISC *A*
1 refills | Status: DC
Start: 2021-06-20 — End: 2021-10-16

## 2021-06-20 NOTE — Telephone Encounter (Signed)
LV   05/25/21    NV    11/28/21     LABS 05/26/21

## 2021-06-20 NOTE — Telephone Encounter (Signed)
Last seen 05-25-21 and next apt 11-28-21. A1c, BMP, urine microalbumin 5/22. Lipids, A1c, BMP ordered.

## 2021-07-01 ENCOUNTER — Other Ambulatory Visit: Payer: Self-pay | Admitting: Primary Care

## 2021-07-04 NOTE — Telephone Encounter (Signed)
Last seen 05-25-21 and next apt 11-28-21.  BMP, A1c, urine microalbumin 5/22.  A1c, lipids and BMP ordered.

## 2021-07-08 ENCOUNTER — Other Ambulatory Visit: Payer: Self-pay | Admitting: Internal Medicine

## 2021-07-08 DIAGNOSIS — E119 Type 2 diabetes mellitus without complications: Secondary | ICD-10-CM

## 2021-07-10 NOTE — Telephone Encounter (Signed)
Last seen 05-25-21 and next apt 11-28-21.   A1c, BMP and Urine microalbumin 5/22.  BMP, A1c and lipids ordered.

## 2021-07-18 LAB — HM DIABETES EYE EXAM

## 2021-07-19 ENCOUNTER — Encounter: Payer: Self-pay | Admitting: Primary Care

## 2021-07-25 ENCOUNTER — Other Ambulatory Visit: Payer: Self-pay | Admitting: Primary Care

## 2021-07-25 NOTE — Telephone Encounter (Signed)
Last seen 05-25-21 and next apt 11-28-21.  A1c, BMP, urine microalbumin 5/22.  All ordered except urine.

## 2021-08-22 ENCOUNTER — Other Ambulatory Visit: Payer: Self-pay | Admitting: Primary Care

## 2021-08-22 NOTE — Telephone Encounter (Signed)
LOV  5.26.22  NOV  11.29.22  LABS  5.27.22

## 2021-08-27 ENCOUNTER — Other Ambulatory Visit: Payer: Self-pay | Admitting: Primary Care

## 2021-08-28 NOTE — Telephone Encounter (Signed)
Last seen 05-25-21 and next apt 11-28-21.  BMP, A1c urine microalbumin 5/22.  Lipids, BMP and A1c ordered.

## 2021-09-30 ENCOUNTER — Other Ambulatory Visit: Payer: Self-pay | Admitting: Primary Care

## 2021-09-30 DIAGNOSIS — E119 Type 2 diabetes mellitus without complications: Secondary | ICD-10-CM

## 2021-10-02 NOTE — Telephone Encounter (Signed)
Last seen 05-25-21 and next apt 11-28-21.   BMP, A1c, urine microalbumin 5/22.  Lipids, A1c, BMP ordered.

## 2021-10-06 ENCOUNTER — Other Ambulatory Visit: Payer: Self-pay | Admitting: Primary Care

## 2021-10-06 DIAGNOSIS — E119 Type 2 diabetes mellitus without complications: Secondary | ICD-10-CM

## 2021-10-06 NOTE — Telephone Encounter (Signed)
Last seen 05-25-21 and next apt 11-28-21.   BMP, A1c, urine microalbumin 5/22.  Lipids, A1c, BMP ordered.

## 2021-10-15 ENCOUNTER — Other Ambulatory Visit: Payer: Self-pay | Admitting: Primary Care

## 2021-10-15 DIAGNOSIS — E119 Type 2 diabetes mellitus without complications: Secondary | ICD-10-CM

## 2021-10-16 ENCOUNTER — Other Ambulatory Visit: Payer: Self-pay | Admitting: Primary Care

## 2021-10-16 NOTE — Telephone Encounter (Signed)
Last seen 05-25-21 and next apt 11-28-21.   BMP, A1c, urine microalbumin 5/22.  Lipids, BMP and A1c ordered.

## 2021-10-20 ENCOUNTER — Other Ambulatory Visit: Payer: Self-pay | Admitting: Primary Care

## 2021-10-20 MED ORDER — INSULIN LISPRO (1 UNIT DIAL) 100 UNIT/ML SC SOPN
PEN_INJECTOR | SUBCUTANEOUS | 1 refills | Status: DC
Start: 2021-10-20 — End: 2021-11-20

## 2021-10-20 NOTE — Telephone Encounter (Signed)
Last seen 05-25-21 and next apt 11-28-21.   BMP, A1c, urine microalbumin 5/22.  Lipids, BMP and A1c ordered.

## 2021-11-19 ENCOUNTER — Other Ambulatory Visit: Payer: Self-pay | Admitting: Primary Care

## 2021-11-20 ENCOUNTER — Other Ambulatory Visit: Payer: Self-pay | Admitting: Primary Care

## 2021-11-20 MED ORDER — INSULIN LISPRO (1 UNIT DIAL) 100 UNIT/ML SC SOPN
PEN_INJECTOR | SUBCUTANEOUS | 1 refills | Status: DC
Start: 2021-11-20 — End: 2021-11-21

## 2021-11-20 NOTE — Telephone Encounter (Signed)
Last seen 05-25-21 and next apt 11-28-21. A1c, BMP, urine microalbumin 5/22. BMP, A1c, lipids ordered.

## 2021-11-21 ENCOUNTER — Telehealth: Payer: Self-pay | Admitting: Primary Care

## 2021-11-21 MED ORDER — INSULIN LISPRO (1 UNIT DIAL) 100 UNIT/ML SC SOPN
PEN_INJECTOR | SUBCUTANEOUS | 1 refills | Status: DC
Start: 2021-11-21 — End: 2021-11-28

## 2021-11-21 MED ORDER — DULAGLUTIDE 3 MG/0.5ML SC SOAJ *I*
3.0000 mg | SUBCUTANEOUS | 2 refills | Status: DC
Start: 2021-11-21 — End: 2022-01-26

## 2021-11-21 NOTE — Telephone Encounter (Signed)
Ok I would try to maximize the trulicity dose first, since it does come in a 3 mg dose once a week.      I'll send in an updated script to reflect the amount of insulin he has been taking too.      If still not getting improvement after 2-3 weeks of the higher dose of trulicity then let us know.  Thanks.      Krystal Eaton, MD

## 2021-11-21 NOTE — Telephone Encounter (Signed)
Patient calling to report that he is unable to get his sugar under control he states he has made adjustments since Sept.  He states he has been at this time doing:   10 units in the am   15 units at noon   15 units at supper.     This started in Sept when his wife was sick and he thinks he may have been under the weather.      He verified he is taking 3 days metformin daily.      The pharmacy has told him he needs to get a new prescription.  He is going to run out before they can fill the current prescription that was just sent on the 21st.      Please let us know what to do. He is scheduled to come in next Tuesday the 29th

## 2021-11-22 NOTE — Telephone Encounter (Signed)
I called and spoke with patient - reviewed Dr Brockway's message below with patient.

## 2021-11-27 ENCOUNTER — Other Ambulatory Visit
Admission: RE | Admit: 2021-11-27 | Discharge: 2021-11-27 | Disposition: A | Discharge status: Home / Self Care | Payer: No Typology Code available for payment source | Source: Ambulatory Visit | Attending: Primary Care | Admitting: Primary Care

## 2021-11-27 DIAGNOSIS — E119 Type 2 diabetes mellitus without complications: Secondary | ICD-10-CM

## 2021-11-27 LAB — BASIC METABOLIC PANEL
Anion Gap: 13 (ref 7–16)
CO2: 26 mmol/L (ref 20–28)
Calcium: 9.7 mg/dL (ref 8.6–10.2)
Chloride: 101 mmol/L (ref 96–108)
Creatinine: 1.22 mg/dL — ABNORMAL HIGH (ref 0.67–1.17)
Glucose: 199 mg/dL — ABNORMAL HIGH (ref 60–99)
Lab: 21 mg/dL — ABNORMAL HIGH (ref 6–20)
Potassium: 4.4 mmol/L (ref 3.3–5.1)
Sodium: 140 mmol/L (ref 133–145)
eGFR BY CREAT: 70 *

## 2021-11-27 LAB — LIPID PANEL
Chol/HDL Ratio: 3.1
Cholesterol: 133 mg/dL
HDL: 43 mg/dL (ref 40–60)
LDL Calculated: 67 mg/dL
Non HDL Cholesterol: 90 mg/dL
Triglycerides: 116 mg/dL

## 2021-11-28 ENCOUNTER — Encounter: Payer: Self-pay | Admitting: Primary Care

## 2021-11-28 ENCOUNTER — Other Ambulatory Visit: Payer: Self-pay

## 2021-11-28 ENCOUNTER — Ambulatory Visit: Payer: No Typology Code available for payment source | Admitting: Primary Care

## 2021-11-28 VITALS — BP 134/88 | HR 76 | Temp 97.8°F | Ht 74.0 in | Wt 280.0 lb

## 2021-11-28 DIAGNOSIS — Z794 Long term (current) use of insulin: Secondary | ICD-10-CM

## 2021-11-28 DIAGNOSIS — I10 Essential (primary) hypertension: Secondary | ICD-10-CM

## 2021-11-28 DIAGNOSIS — E119 Type 2 diabetes mellitus without complications: Secondary | ICD-10-CM

## 2021-11-28 LAB — PCMH DEPRESSION ASSESSMENT

## 2021-11-28 LAB — HEMOGLOBIN A1C: Hemoglobin A1C: 6.3 % — ABNORMAL HIGH

## 2021-11-28 MED ORDER — INSULIN LISPRO (1 UNIT DIAL) 100 UNIT/ML SC SOPN
PEN_INJECTOR | SUBCUTANEOUS | 1 refills | Status: DC
Start: 2021-11-28 — End: 2022-01-27

## 2021-11-28 NOTE — Progress Notes (Addendum)
[facility]  Outpatient Visit Note       Reason For Visit:   Chief Complaint   Patient presents with    Follow-up       Subjective:      Noah Craig is a 55 y.o. man with a history of HLD, HTN, DM, presenting for follow up.  Issues discussed below.    DM:  Last 90 days average on his dexcom was 171.  This is in contrast to the A1c which is 6.3.  Currently taking lispro 10/14/14, jardiance 25 mg daily, metformin 5621 mg BID, and trulicity 3 mg weekly.  No hypoglycemia symptoms.  Has the option to increase his lispro for periods when his sugars are more elevated.      HTN:  Currently taking losartan 25 mg daily.  No lightheadedness, chest pain, or shortness of breath.  Renal function and electrolytes on his labs have been overall stable.    Home Medications:     Prior to Admission medications    Medication Sig Start Date End Date Taking? Authorizing Provider   dulaglutide (TRULICITY) 3 HY/8.6VH pen Inject 0.5 mLs (3 mg total) into the skin every 7 days 11/21/21  Yes Krystal Eaton, MD   insulin lispro (INSULIN LISPRO, 1 UNIT DIAL,) 100 UNIT/ML injection pen INJECT 10 units with breakfast, 15 units with lunch and 15 units with dinner.  DISCARD PEN 28 DAYS AFTER FIRST USE. MAXIMUM DAILY DOSE OF 30 UNITS 11/21/21  Yes Krystal Eaton, MD   JARDIANCE 25 MG tablet TAKE 1 TABLET BY MOUTH EVERY MORNING 11/20/21  Yes Krystal Eaton, MD   insulin pen needle [facility] UNIFINE PENTIPS PLUS) 31G X 6 MM USE THREE TIMES DAILY AS DIRECTED 10/16/21  Yes Krystal Eaton, MD   Continuous Blood Gluc Sensor (DEXCOM G6 SENSOR) USE AS DIRECTED CHANGE EVERY 10 DAYS 10/06/21  Yes Krystal Eaton, MD   Continuous Blood Gluc Transmit (DEXCOM G6 TRANSMITTER) ATTACH TO SENSOR; REPLACE EVERY 3 MONTHS 10/02/21  Yes Krystal Eaton, MD   losartan (COZAAR) 25 mg tablet TAKE 1 TABLET BY MOUTH EVERY DAY 08/22/21  Yes Tamera Reason, MD   metFORMIN (GLUCOPHAGE-XR) 500 mg 24 hr tablet Take 2 tablets in the AM and one in  the PM 07/04/21  Yes Krystal Eaton, MD   Continuous Blood Gluc Receiver (DEXCOM G6 RECEIVER) Use as directed to monitor blood glucose 10/19/20  Yes Krystal Eaton, MD   clindamycin (CLEOCIN T) 1 % lotion Apply topically 2 times daily  to the following areas: back of neck.  Use twice a day as needed for flares of folliculitis. 10/19/20  Yes Krystal Eaton, MD   sildenafil (REVATIO) 20 MG tablet 2-5 tab daily prn, taken 30-60 minutes before sexual activity 10/01/19  Yes Penni Bombard, MD   naproxen sodium (ANAPROX) 220 MG tablet Take 220 mg by mouth 2 times daily (with meals)   Pt takes as needed   Yes [provider]       Allergies:     Allergies   Allergen Reactions    Lisinopril Cough    Victoza [Liraglutide] Other (See Comments)     GI upset    Environmental Allergies Other (See Comments)     Sneezing      No Known Latex Allergy      Created by Conversion - 0;      Review of Systems:     He does report that 1-2 times per year he will have seafood including shellfish.  This will typically make him get a watery nose and itchy eyes shortly after eating it, however has no oropharyngeal involvement, cough, hives, or shortness of breath.  Did advise that this type of reaction is unlikely to progress to anything more severe, and would leave to his discretion about whether or not he would want to totally avoid shellfish altogether.    Physical Exam:  Temp Readings from Last 3 Encounters:   11/28/21 36.6 C (97.8 F) (Temporal)   05/25/21 36.2 C (97.2 F) (Temporal)   02/13/21 36.4 C (97.5 F) (Temporal)     BP Readings from Last 3 Encounters:   11/28/21 134/88   05/25/21 108/64   02/14/21 168/78     Pulse Readings from Last 3 Encounters:   11/28/21 76   05/25/21 80   02/14/21 64         Vitals:    11/28/21 1505   BP: 134/88   BP Location: Left arm   Patient Position: Sitting   Cuff Size: large adult   Pulse: 76   Temp: 36.6 C (97.8 F)   TempSrc: Temporal   SpO2: 97%   Weight: 127 kg (280 lb)    Height: 1.88 m (6\' 2" )     Wt Readings from Last 3 Encounters:   11/28/21 127 kg (280 lb)   05/25/21 121.2 kg (267 lb 3.2 oz)   03/02/21 126.1 kg (278 lb)     General: Well-developed male, in no acute distress.  HEENT: Mild sinus mucosal fullness with clear rhinorrhea on the right.  Lungs: Breathing comfortably at rest.  Lungs CTAB without adventitious sounds.  Cardiovascular: RRR no m/r/g.  Extremities warm and dry to touch.   Extremities: No edema, no rashes.  Neurologic: Fully interactive and cooperative with exam.  Able to provide an accurate history.  Speech and mood appropriate.      ASSESSMENT and Plan:     55 y.o. male with a history of HLD, HTN, DM, presenting for follow up.  Plan by problem below.    1. Type 2 diabetes mellitus without complication, with long-term current use of insulin   -A1c is at goal.  He does not have any known hemoglobinopathy or suspicion for blood loss or other type of anemia that would falsely lower his hemoglobin A1c value.  Recommended continuing on the Trulicity at the slightly higher dose of 3 mg/week and continue his other medications unchanged.  Ideally, the added Trulicity will lower his insulin need over the coming weeks.  He will keep his home readings in mind when trying to interpret them so that he does not end up overtreating himself with the short acting insulin.   2. Essential hypertension   -Repeat blood pressure check today did come down a little bit, to goal.  No changes to losartan at current dose.     Krystal Eaton, MD 11/28/2021 3:20 PM

## 2021-11-30 LAB — FRUCTOSAMINE: Fructosamine: 292 umol/L — ABNORMAL HIGH (ref 205–285)

## 2021-12-17 ENCOUNTER — Other Ambulatory Visit: Payer: Self-pay | Admitting: Primary Care

## 2021-12-18 NOTE — Telephone Encounter (Signed)
Last seen 11-28-21 and next apt 05-02-22.  BMP, A1c, lipids 11/22. A1c, CMP, lipids and urine microalbumin ordered.

## 2021-12-27 ENCOUNTER — Other Ambulatory Visit: Payer: Self-pay | Admitting: Primary Care

## 2021-12-27 DIAGNOSIS — E119 Type 2 diabetes mellitus without complications: Secondary | ICD-10-CM

## 2021-12-27 NOTE — Telephone Encounter (Signed)
Last seen 11-28-21, sched 05-02-22  Labs 11-27-21, has future labs

## 2022-01-26 ENCOUNTER — Other Ambulatory Visit: Payer: Self-pay | Admitting: Primary Care

## 2022-01-26 NOTE — Telephone Encounter (Signed)
Last seen 11-28-21 and next apt 05-02-22. BMP, A1c, lipids 11/22.  Lipids, CMP, A1c, urine microalbumin ordered.

## 2022-01-27 ENCOUNTER — Other Ambulatory Visit: Payer: Self-pay | Admitting: Primary Care

## 2022-01-29 MED ORDER — INSULIN LISPRO (1 UNIT DIAL) 100 UNIT/ML SC SOPN
PEN_INJECTOR | SUBCUTANEOUS | 1 refills | Status: DC
Start: 2022-01-29 — End: 2022-05-31

## 2022-01-29 NOTE — Telephone Encounter (Signed)
Last seen 11-28-21 and next apt 05-02-22.  BMP, A1c,lipids 11/22.  CMP, A1c,lipids and urine microalbumin ordered.

## 2022-02-13 ENCOUNTER — Ambulatory Visit: Payer: No Typology Code available for payment source | Admitting: Internal Medicine

## 2022-02-13 VITALS — BP 130/80 | HR 75 | Temp 98.2°F | Ht 74.0 in | Wt 277.0 lb

## 2022-02-13 DIAGNOSIS — J019 Acute sinusitis, unspecified: Secondary | ICD-10-CM

## 2022-02-13 MED ORDER — AMOXICILLIN-POT CLAVULANATE 875-125 MG PO TABS *I*
1.0000 | ORAL_TABLET | Freq: Two times a day (BID) | ORAL | 0 refills | Status: AC
Start: 2022-02-13 — End: 2022-02-23

## 2022-02-13 NOTE — Progress Notes (Signed)
[facility]  Progress Note  Chief Complaint   Patient presents with    Sinusitis       Noah Craig is a 56 y.o. male who presents today for suspected sinusitis    Ongoing x 1 month. Just won't go away. when he blows nose in the AM it bleeds. Congested, discolored mucus. No fevers or chills.pressure in the maxillary sinuses. Last night even his glasses resting on the area was uncomfortable Using flonase to help keep things open. Does have allergies, thinks that may have triggered it. Some days are better than others but overall just very persistent.           MEDICAL PROBLEMS:  Patient Active Problem List   Diagnosis Code    Diabetes mellitus E11.9    Essential hypertension I10    Mixed hyperlipidemia E78.2    Strain of Achilles tendon, right, initial encounter S86.011A        CURRENT MEDICATIONS:    Current Outpatient Medications:     insulin lispro (INSULIN LISPRO, 1 UNIT DIAL,) 100 UNIT/ML injection pen, INJECT 10 units with breakfast, 15 units with lunch and 15 units with dinner.  DISCARD PEN 28 DAYS AFTER FIRST USE. MAXIMUM DAILY DOSE OF 40 UNITS, Disp: 46 mL, Rfl: 1    TRULICITY 3 GL/8.7FI pen, INJECT 0.5 MLS SUBCUTANEOUSLY (UNDER THE SKIN) EVERY 7 DAYS, Disp: 2 mL, Rfl: 2    Continuous Blood Gluc Sensor (DEXCOM G6 SENSOR), USE AS DIRECTED CHANGE EVERY 10 DAYS, Disp: 3 each, Rfl: 2    metFORMIN (GLUCOPHAGE-XR) 500 mg 24 hr tablet, TAKE 2 TABLETS BY MOUTH EVERY MORNING AND TAKE 1 TABLET BY MOUTH EVERY EVENING, Disp: 270 tablet, Rfl: 1    JARDIANCE 25 MG tablet, TAKE 1 TABLET BY MOUTH EVERY MORNING, Disp: 90 tablet, Rfl: 1    insulin pen needle (WEGMANS UNIFINE PENTIPS PLUS) 31G X 6 MM, USE THREE TIMES DAILY AS DIRECTED, Disp: 200 each, Rfl: 1    Continuous Blood Gluc Transmit (DEXCOM G6 TRANSMITTER), ATTACH TO SENSOR; REPLACE EVERY 3 MONTHS, Disp: 1 each, Rfl: 1    losartan (COZAAR) 25 mg tablet, TAKE 1 TABLET BY MOUTH EVERY DAY, Disp: 90 tablet, Rfl: 1    Continuous Blood Gluc Receiver  (DEXCOM G6 RECEIVER), Use as directed to monitor blood glucose, Disp: 1 each, Rfl: 0    clindamycin (CLEOCIN T) 1 % lotion, Apply topically 2 times daily  to the following areas: back of neck.  Use twice a day as needed for flares of folliculitis., Disp: 60 mL, Rfl: 0    sildenafil (REVATIO) 20 MG tablet, 2-5 tab daily prn, taken 30-60 minutes before sexual activity, Disp: 40 tablet, Rfl: 5    naproxen sodium (ANAPROX) 220 MG tablet, Take 220 mg by mouth 2 times daily (with meals)   Pt takes as needed, Disp: , Rfl:     amoxicillin-clavulanate (AUGMENTIN) 875-125 mg tablet, Take 1 tablet by mouth 2 times daily for 10 days  for Sinus Irritation and Congestion, Disp: 20 tablet, Rfl: 0  Medications reviewed with patient, reconciled and no changes made today.    ALLERGIES:  Lisinopril, Victoza [liraglutide], Environmental allergies, and No known latex allergy   Allergies reviewed with patient and confirmed today.    EXAM:  Vitals:    02/13/22 1325   BP: 130/80   Pulse: 75   Temp: 36.8 C (98.2 F)   SpO2: 98%   Weight: 125.6 kg (277 lb)   Height: 1.88 m (6\' 2" )  Body mass index is 35.56 kg/m.  BP Readings from Last 4 Encounters:   02/13/22 130/80   11/28/21 134/88   05/25/21 108/64   02/14/21 168/78      Wt Readings from Last 4 Encounters:   02/13/22 125.6 kg (277 lb)   11/28/21 127 kg (280 lb)   05/25/21 121.2 kg (267 lb 3.2 oz)   03/02/21 126.1 kg (278 lb)         Physical examination:  General: Well-appearing and well-nourished, nontoxic, no acute distress.  HEENT: Normocephalic, Atraumatic. Normal Conjunctiva. Nose with congestion and erythema. Mucus membranes are very dry. Frontal and maxillary sinuses are tender.  Mucous membranes are moist.  The uvula is mid line, the pharynx is without swelling erythema exudate.  There is no evidence of PTA.  Bilateral TMs are clear, there is no external canal swelling, pinna and mastoids are normal.  Neck: Supple, trachea midline.  No cervical  or supraclavicular lymphadenopathy.  Heart: Regular rate and rhythm no murmurs, rubs or gallops.  Lungs: Breathing is unlabored.  No respiratory distress, respiratory rate normal.  Lungs are clear to auscultation, no wheezes rales or rhonchi.  Neurological: Alert and oriented x3 no focal neurological deficits noted.    ASSESSMENT/PLAN:  1. Acute sinusitis  Given quality and duration of symptoms, do believe that antibiotics are warranted.  I will start the patient on augmentin, use and adverse effects reviewed.  Counseled on supportive measures and symptoms that should prompt a call. Patient agreeable with this plan.       Collier Bullock, PA on 02/13/2022 1:40 PM

## 2022-02-16 ENCOUNTER — Other Ambulatory Visit: Payer: Self-pay | Admitting: Primary Care

## 2022-02-16 ENCOUNTER — Telehealth: Payer: Self-pay | Admitting: Primary Care

## 2022-02-16 DIAGNOSIS — E119 Type 2 diabetes mellitus without complications: Secondary | ICD-10-CM

## 2022-02-16 NOTE — Telephone Encounter (Signed)
Patient would like to get the new Dexcom G7 transmitter and sensor.  He has spoken with [facility] and His insurance and this would be very helpful for him as it is attached to the arm rather than his abdomen.  Can you make the changes on his rx and let me know and I can have this approved by either Mickel Baas or [place].

## 2022-02-16 NOTE — Telephone Encounter (Signed)
Let patient know this will be addressed next week, work order in to IT to get this added to the system

## 2022-02-16 NOTE — Telephone Encounter (Signed)
Last seen 11-28-21 and next apt 05-02-22.   BMP 11/22 and CMP ordered.

## 2022-02-19 MED ORDER — DEXCOM G7 SENSOR MISC *A*
1.0000 | 5 refills | Status: DC
Start: 2022-02-19 — End: 2022-03-30

## 2022-02-19 MED ORDER — DEXCOM G7 RECEIVER DEVI *A*
1.0000 | 5 refills | Status: AC
Start: 2022-02-19 — End: ?

## 2022-02-19 NOTE — Telephone Encounter (Signed)
I may have sent this? Not sure if orders are right. I'm sure we'll hear from pharmacy if any issue.

## 2022-02-26 ENCOUNTER — Other Ambulatory Visit: Payer: Self-pay | Admitting: Primary Care

## 2022-02-26 DIAGNOSIS — E119 Type 2 diabetes mellitus without complications: Secondary | ICD-10-CM

## 2022-02-26 NOTE — Telephone Encounter (Signed)
LOV  2.14.23  NOV  5.3.23  LABS  11.28.22

## 2022-03-01 ENCOUNTER — Telehealth: Payer: Self-pay | Admitting: Primary Care

## 2022-03-01 MED ORDER — AMOXICILLIN-POT CLAVULANATE 875-125 MG PO TABS *I*
1.0000 | ORAL_TABLET | Freq: Two times a day (BID) | ORAL | 0 refills | Status: AC
Start: 2022-03-01 — End: 2022-03-11

## 2022-03-01 NOTE — Telephone Encounter (Signed)
Draining down his throat, still congested and his sinus are full.  He was given antibiotics on 02/13/22 but the symptoms had gone away but have returned in the last two days.  Patient states it was the same last year and took 2 rounds of antibiotics to kick this.     He uses [facility] in [place] for his pharmacy Please let him know if you are able to do this.

## 2022-03-01 NOTE — Telephone Encounter (Signed)
Sent in another rx. If this doesn't kick it we'll need to see him back

## 2022-03-01 NOTE — Telephone Encounter (Signed)
Patient notified and picked up RX already

## 2022-03-29 ENCOUNTER — Telehealth: Payer: Self-pay | Admitting: Primary Care

## 2022-03-29 NOTE — Telephone Encounter (Signed)
Need prior auth for Dexcom G7      Key: [id number]

## 2022-03-30 ENCOUNTER — Other Ambulatory Visit: Payer: Self-pay | Admitting: Internal Medicine

## 2022-03-30 DIAGNOSIS — E119 Type 2 diabetes mellitus without complications: Secondary | ICD-10-CM

## 2022-03-30 NOTE — Telephone Encounter (Signed)
Message from insurance   Explanation:Case not required.Provider indicated dosing, "By 1 each no specified route continuously - Does not apply" does not exceed plan limits. Member last obtained on 02/19/2022 Dexcom [place]. Per [facility] user guide, additional receivers should not be required. Please note [place] is covered at 3 kits per 30days or 9kits for 90days, DEXCOM G7 TRANSMITTER is covered at 1 kit per 90days; DEXCOM G7 RECEIVER is covered at 1 kit per 365 day period (but billed at maximum 90days per claim, no refill within 342 day period).;

## 2022-03-30 NOTE — Telephone Encounter (Signed)
LOV - 02-13-22  NOV - 05-02-22  LABS - 11-27-21

## 2022-04-15 ENCOUNTER — Other Ambulatory Visit: Payer: Self-pay | Admitting: Primary Care

## 2022-04-16 NOTE — Telephone Encounter (Signed)
lv 2.14.23  nv 5.3.23  Labs 11.28.22

## 2022-05-01 ENCOUNTER — Telehealth: Payer: Self-pay | Admitting: Primary Care

## 2022-05-01 NOTE — Telephone Encounter (Signed)
Ok thank you for letting me know.  Janetta Vandoren, MD

## 2022-05-01 NOTE — Telephone Encounter (Signed)
FYI-  He has rescheduled his appointment with you he had tomorrow out to May 31, 2022.

## 2022-05-02 ENCOUNTER — Ambulatory Visit: Payer: No Typology Code available for payment source | Admitting: Primary Care

## 2022-05-03 ENCOUNTER — Encounter: Payer: Self-pay | Admitting: Gastroenterology

## 2022-05-04 ENCOUNTER — Encounter: Payer: Self-pay | Admitting: Gastroenterology

## 2022-05-06 ENCOUNTER — Other Ambulatory Visit: Payer: Self-pay | Admitting: Internal Medicine

## 2022-05-06 DIAGNOSIS — E119 Type 2 diabetes mellitus without complications: Secondary | ICD-10-CM

## 2022-05-07 ENCOUNTER — Encounter: Payer: Self-pay | Admitting: Gastroenterology

## 2022-05-07 NOTE — Telephone Encounter (Signed)
lv 5.26.22  nv 6.1.23  Labs 11.28.22

## 2022-05-11 ENCOUNTER — Other Ambulatory Visit: Payer: Self-pay | Admitting: Primary Care

## 2022-05-11 NOTE — Telephone Encounter (Signed)
Last seen (LC) 02-13-22 and next apt 05-31-22.  BMP, A1c,lipids 11/22.  CMP, A1c, lipids and urine microalbumin ordered.

## 2022-05-15 ENCOUNTER — Telehealth: Payer: Self-pay | Admitting: Primary Care

## 2022-05-15 NOTE — Telephone Encounter (Signed)
Patient called to update his pharmacy - no longer using [facility].  Changed to Noah Craig.  Advised of auto refills.

## 2022-05-30 ENCOUNTER — Other Ambulatory Visit: Payer: Self-pay | Admitting: Primary Care

## 2022-05-30 DIAGNOSIS — E119 Type 2 diabetes mellitus without complications: Secondary | ICD-10-CM

## 2022-05-30 NOTE — Telephone Encounter (Signed)
Last OV 11/28/21  Next OV 05/31/22  Last labs 11/27/21

## 2022-05-31 ENCOUNTER — Ambulatory Visit: Payer: No Typology Code available for payment source | Admitting: Primary Care

## 2022-05-31 ENCOUNTER — Other Ambulatory Visit: Payer: Self-pay

## 2022-05-31 ENCOUNTER — Encounter: Payer: Self-pay | Admitting: Primary Care

## 2022-05-31 VITALS — BP 132/80 | HR 73 | Temp 98.0°F | Wt 278.8 lb

## 2022-05-31 DIAGNOSIS — E119 Type 2 diabetes mellitus without complications: Secondary | ICD-10-CM

## 2022-05-31 DIAGNOSIS — Z125 Encounter for screening for malignant neoplasm of prostate: Secondary | ICD-10-CM

## 2022-05-31 DIAGNOSIS — I10 Essential (primary) hypertension: Secondary | ICD-10-CM

## 2022-05-31 DIAGNOSIS — Z794 Long term (current) use of insulin: Secondary | ICD-10-CM

## 2022-05-31 MED ORDER — TRULICITY 3 MG/0.5ML SC SOAJ
SUBCUTANEOUS | 5 refills | Status: DC
Start: 2022-05-31 — End: 2022-10-10

## 2022-05-31 MED ORDER — DEXCOM G7 SENSOR MISC *A*
5 refills | Status: DC
Start: 2022-05-31 — End: 2022-06-06

## 2022-05-31 MED ORDER — INSULIN LISPRO (1 UNIT DIAL) 100 UNIT/ML SC SOPN
PEN_INJECTOR | SUBCUTANEOUS | 5 refills | Status: DC
Start: 2022-05-31 — End: 2023-10-07

## 2022-05-31 NOTE — Progress Notes (Signed)
[facility]  Outpatient Visit Note       Reason For Visit:   Chief Complaint   Patient presents with   . Hypertension   . Diabetes       Subjective:      Noah Craig is a 56 y.o. man with a history of diabetes, hypertension, hyperlipidemia, presenting for follow-up.  Issues discussed below.    DM:  Sensors lasting maybe a quarter of the time.  No hypoglycemic symptoms, or at worst less than 1% of the total time on his sensor.  He is taking Trulicity 3 mg weekly, Jardiance 25 mg daily, lispro 10 units, 15 units, and 15 units with breakfast, lunch, and dinner, respectively, and metformin 1000 mg a.m. and 500 mg p.m.  He is due to update his A1c and microalbumin levels.  Foot exam is also due today.  He declines Prevnar 20 today.  LDL already consistently under 70.      HTN:  Blood pressure on repeat check came down nicely to the low 130s over 80.  He takes losartan 25 mg daily and is tolerating that well.  Due to update renal function.      Home Medications:     Prior to Admission medications    Medication Sig Start Date End Date Taking? Authorizing Provider   JARDIANCE 25 MG tablet TAKE 1 TABLET BY MOUTH EVERY MORNING 05/11/22   Marjory Sneddon, MD   Continuous Blood Gluc Sensor (DEXCOM G7 SENSOR) USE AS DIRECTED CONTINUOUSLY 05/07/22   Marjory Sneddon, MD   TRULICITY 3 MG/0.5ML pen INJECT 3MG  SUBCUTANEOUSLY (UNDER THE SKIN) EVERY 7 DAYS 04/16/22   Marjory Sneddon, MD   insulin pen needle [facility] UNIFINE PENTIPS PLUS) 31G X 6 MM USE THREE TIMES A DAY AS DIRECTED 02/26/22   Marjory Sneddon, MD   Continuous Blood Gluc Receiver (DEXCOM G7 RECEIVER) DEVI By 1 each no specified route continuously 02/19/22   Darliss Ridgel, PA   losartan (COZAAR) 25 mg tablet TAKE 1 TABLET BY MOUTH EVERY DAY 02/16/22   Whelen, Madelaine Etienne, NP   insulin lispro (INSULIN LISPRO, 1 UNIT DIAL,) 100 UNIT/ML injection pen INJECT 10 units with breakfast, 15 units with lunch and 15 units with dinner.  DISCARD PEN 28 DAYS AFTER  FIRST USE. MAXIMUM DAILY DOSE OF 40 UNITS 01/29/22   Marjory Sneddon, MD   metFORMIN (GLUCOPHAGE-XR) 500 mg 24 hr tablet TAKE 2 TABLETS BY MOUTH EVERY MORNING AND TAKE 1 TABLET BY MOUTH EVERY EVENING 12/18/21   Marjory Sneddon, MD   clindamycin (CLEOCIN T) 1 % lotion Apply topically 2 times daily  to the following areas: back of neck.  Use twice a day as needed for flares of folliculitis. 10/19/20   Marjory Sneddon, MD   sildenafil (REVATIO) 20 MG tablet 2-5 tab daily prn, taken 30-60 minutes before sexual activity 10/01/19   Guadalupe Maple, MD   naproxen sodium (ANAPROX) 220 MG tablet Take 220 mg by mouth 2 times daily (with meals)   Pt takes as needed    [provider]       Allergies:     Allergies   Allergen Reactions   . Lisinopril Cough   . Victoza [Liraglutide] Other (See Comments)     GI upset   . Environmental Allergies Other (See Comments)     Sneezing     . No Known Latex Allergy      Created by Conversion - 0;      Review  of Systems:     He does report bloody noses in the setting of allergies and Flonase use.  I expect this will improve once the allergies improve in several months and Flonase use decreases.  Continue to monitor.    Physical Exam:  Temp Readings from Last 3 Encounters:   05/31/22 36.7 C (98 F)   02/13/22 36.8 C (98.2 F)   11/28/21 36.6 C (97.8 F) (Temporal)     BP Readings from Last 3 Encounters:   05/31/22 146/88   02/13/22 130/80   11/28/21 134/88     Pulse Readings from Last 3 Encounters:   05/31/22 73   02/13/22 75   11/28/21 76         Vitals:    05/31/22 0847   BP: 146/88   Pulse: 73   Temp: 36.7 C (98 F)   SpO2: 99%   Weight: 126.5 kg (278 lb 12.8 oz)     Wt Readings from Last 3 Encounters:   05/31/22 126.5 kg (278 lb 12.8 oz)   02/13/22 125.6 kg (277 lb)   11/28/21 127 kg (280 lb)     General: Well-developed male, in no acute distress.  Lungs: Breathing comfortably at rest.    Cardiovascular: Extremities warm and dry to touch.   Extremities: Diabetic foot  exam: Readily palpable DP and PT pulses bilaterally.  Skin intact without ulceration. 10/10 monofilament sites tested intact bilaterally.    Neurologic: Fully interactive and cooperative with exam.  Able to provide an accurate history.  Speech and mood appropriate.      ASSESSMENT and Plan:     56 y.o. male with a history of diabetes, hypertension, hyperlipidemia, presenting for follow-up.  Plan by problem below.    1. Type 2 diabetes mellitus without complication, with long-term current use of insulin   -Continue current regimen with Jardiance, Trulicity, short acting insulin, and metformin.  Tolerating therapies well and he can self-correct his diet based on his sensor readings.   2. Essential hypertension   -Blood pressure is very close to goal on his current regimen.  No changes made today to losartan 25 mg/day.     Follow-up again in about 6 months.    Marjory Sneddon, MD 05/31/2022 8:36 AM

## 2022-06-03 ENCOUNTER — Other Ambulatory Visit: Payer: Self-pay | Admitting: Primary Care

## 2022-06-03 DIAGNOSIS — E119 Type 2 diabetes mellitus without complications: Secondary | ICD-10-CM

## 2022-06-04 MED ORDER — EMPAGLIFLOZIN 25 MG PO TABS *I*
25.0000 mg | ORAL_TABLET | Freq: Every morning | ORAL | 1 refills | Status: DC
Start: 2022-06-04 — End: 2022-11-19

## 2022-06-04 MED ORDER — LOSARTAN POTASSIUM 25 MG PO TABS *I*
25.0000 mg | ORAL_TABLET | Freq: Every day | ORAL | 1 refills | Status: DC
Start: 2022-06-04 — End: 2022-11-19

## 2022-06-04 MED ORDER — METFORMIN HCL 500 MG PO TB24 *I*
ORAL_TABLET | ORAL | 1 refills | Status: DC
Start: 2022-06-04 — End: 2022-11-26

## 2022-06-04 NOTE — Telephone Encounter (Signed)
LOV  6.1.23  NOV  12.4.23  LABS  11.28.22

## 2022-06-04 NOTE — Telephone Encounter (Signed)
LOV  6.1.23  NOV  12.4.23  LABS  11.28.22

## 2022-06-06 ENCOUNTER — Other Ambulatory Visit: Payer: Self-pay | Admitting: Primary Care

## 2022-06-06 DIAGNOSIS — E119 Type 2 diabetes mellitus without complications: Secondary | ICD-10-CM

## 2022-06-06 NOTE — Telephone Encounter (Signed)
Last OV 05/31/22  Next OV 12/03/22  Last labs 11/27/21  Sent 6/1 but only for 1 w/ 5 refills.  Do you want to send 3 for the mos. With 5 refills?  If so, order pending.

## 2022-06-07 MED ORDER — DEXCOM G7 SENSOR MISC *A*
5 refills | Status: DC
Start: 2022-06-07 — End: 2022-12-10

## 2022-06-28 DIAGNOSIS — E113293 Type 2 diabetes mellitus with mild nonproliferative diabetic retinopathy without macular edema, bilateral: Secondary | ICD-10-CM | POA: Insufficient documentation

## 2022-06-28 LAB — HM DIABETES EYE EXAM

## 2022-06-29 ENCOUNTER — Encounter: Payer: Self-pay | Admitting: Primary Care

## 2022-08-30 ENCOUNTER — Other Ambulatory Visit: Payer: Self-pay | Admitting: Primary Care

## 2022-08-30 DIAGNOSIS — E119 Type 2 diabetes mellitus without complications: Secondary | ICD-10-CM

## 2022-08-30 NOTE — Telephone Encounter (Signed)
Last seen 05-31-22, sched 12-03-22  Labs 11-27-21, has labs

## 2022-08-31 MED ORDER — WEGMANS UNIFINE PENTIPS PLUS 31G X 6 MM MISC
1 refills | Status: DC
Start: 2022-08-31 — End: 2023-01-11

## 2022-10-09 ENCOUNTER — Ambulatory Visit: Payer: No Typology Code available for payment source | Admitting: Primary Care

## 2022-10-09 ENCOUNTER — Other Ambulatory Visit: Payer: Self-pay

## 2022-10-09 ENCOUNTER — Other Ambulatory Visit
Admission: RE | Admit: 2022-10-09 | Discharge: 2022-10-09 | Disposition: A | Discharge status: Home / Self Care | Payer: No Typology Code available for payment source | Source: Ambulatory Visit | Attending: Primary Care | Admitting: Primary Care

## 2022-10-09 VITALS — BP 142/80 | HR 65 | Temp 97.8°F | Ht 74.0 in | Wt 285.6 lb

## 2022-10-09 DIAGNOSIS — R194 Change in bowel habit: Secondary | ICD-10-CM

## 2022-10-09 DIAGNOSIS — R5383 Other fatigue: Secondary | ICD-10-CM | POA: Insufficient documentation

## 2022-10-09 DIAGNOSIS — E119 Type 2 diabetes mellitus without complications: Secondary | ICD-10-CM | POA: Insufficient documentation

## 2022-10-09 DIAGNOSIS — Z794 Long term (current) use of insulin: Secondary | ICD-10-CM | POA: Insufficient documentation

## 2022-10-09 LAB — MICROALBUMIN, URINE, RANDOM
Creatinine,UR: 106 mg/dL (ref 20–300)
Microalb/Creat Ratio: 27.5 mg MA/g CR (ref 0.0–29.9)
Microalbumin,UR: 2.91 mg/dL

## 2022-10-09 LAB — COMPREHENSIVE METABOLIC PANEL
ALT: 48 U/L (ref 0–50)
AST: 29 U/L (ref 0–50)
Albumin: 4.2 g/dL (ref 3.5–5.2)
Alk Phos: 98 U/L (ref 40–130)
Anion Gap: 9 (ref 7–16)
Bilirubin,Total: 0.6 mg/dL (ref 0.0–1.2)
CO2: 27 mmol/L (ref 20–28)
Calcium: 9.4 mg/dL (ref 8.6–10.2)
Chloride: 104 mmol/L (ref 96–108)
Creatinine: 1.11 mg/dL (ref 0.67–1.17)
Glucose: 107 mg/dL — ABNORMAL HIGH (ref 60–99)
Lab: 10 mg/dL (ref 6–20)
Potassium: 4.2 mmol/L (ref 3.3–5.1)
Sodium: 140 mmol/L (ref 133–145)
Total Protein: 6.7 g/dL (ref 6.3–7.7)
eGFR BY CREAT: 78 *

## 2022-10-09 LAB — CBC
Hematocrit: 46 % (ref 37–52)
Hemoglobin: 15.7 g/dL (ref 12.0–17.0)
MCH: 30 pg (ref 26–32)
MCHC: 34 g/dL (ref 32–37)
MCV: 89 fL (ref 75–100)
Platelets: 199 10*3/uL (ref 150–450)
RBC: 5.2 MIL/uL (ref 4.0–6.0)
RDW: 13 % (ref 0.0–15.0)
WBC: 7.6 10*3/uL (ref 3.5–11.0)

## 2022-10-09 LAB — TSH: TSH: 1.8 u[IU]/mL (ref 0.27–4.20)

## 2022-10-09 NOTE — Addendum Note (Signed)
Addended by: Marjory Sneddon on: 10/09/2022 02:11 PM     Modules accepted: Orders

## 2022-10-09 NOTE — Progress Notes (Signed)
[facility]  Outpatient Visit Note       Reason For Visit:   Chief Complaint   Patient presents with    GI Problem     Diarrhea, fatigue       Subjective:      Noah Craig is a 56 y.o. man with a history of HTN, HLD, DM, presenting for an acute visit for GI symptoms.     GI symptoms:  Notable medications include metformin 1500 mg total/day and dulaglutide 3 mg weekly.  Really fatigued in general.  About a week since his last regular BM.  The fatigue has been ongoing for about a month or more now; the stool changes for about the last week.  Doesn't find sleep refreshing.  Does find himself napping more than usual as well.  BGs have been "horrible."  Avg glucose last 3 days in in the 240s; 200 over the last 3 months.  No blood in the stools.  Colon cancer screening is up to date (2019; 5-year repeat).  Does not have any particular bowel regimen.  No recent illnesses, fevers, etc.        Home Medications:     Prior to Admission medications    Medication Sig Start Date End Date Taking? Authorizing Provider   insulin pen needle [facility] UNIFINE PENTIPS PLUS) 31G X 6 MM USE THREE TIMES A DAY AS DIRECTED 08/31/22  Yes Guadalupe Maple, MD   Continuous Blood Gluc Sensor (DEXCOM G7 SENSOR) Apply sensor as directed. Change every 10 days. 06/07/22  Yes Marjory Sneddon, MD   empagliflozin (JARDIANCE) 25 mg tablet Take 1 tablet (25 mg total) by mouth every morning 06/04/22  Yes Marjory Sneddon, MD   losartan (COZAAR) 25 mg tablet Take 1 tablet (25 mg total) by mouth daily 06/04/22  Yes Marjory Sneddon, MD   metFORMIN (GLUCOPHAGE-XR) 500 mg 24 hr tablet TAKE 2 TABLETS BY MOUTH EVERY MORNING AND TAKE 1 TABLET BY MOUTH EVERY EVENING 06/04/22  Yes Marjory Sneddon, MD   dulaglutide (TRULICITY) 3 MG/0.5ML pen INJECT 3MG  SUBCUTANEOUSLY (UNDER THE SKIN) EVERY 7 DAYS 05/31/22  Yes Marjory Sneddon, MD   insulin lispro (INSULIN LISPRO, 1 UNIT DIAL,) 100 UNIT/ML injection pen INJECT 10 units with breakfast, 15 units  with lunch and 15 units with dinner.  DISCARD PEN 28 DAYS AFTER FIRST USE. MAXIMUM DAILY DOSE OF 40 UNITS 05/31/22  Yes Marjory Sneddon, MD   Continuous Blood Gluc Receiver (DEXCOM G7 RECEIVER) DEVI By 1 each no specified route continuously 02/19/22  Yes Darliss Ridgel, PA   clindamycin (CLEOCIN T) 1 % lotion Apply topically 2 times daily  to the following areas: back of neck.  Use twice a day as needed for flares of folliculitis. 10/19/20  Yes Marjory Sneddon, MD   sildenafil (REVATIO) 20 MG tablet 2-5 tab daily prn, taken 30-60 minutes before sexual activity 10/01/19  Yes Guadalupe Maple, MD   naproxen sodium (ANAPROX) 220 MG tablet Take 1 tablet (220 mg total) by mouth 2 times daily (with meals)  Pt takes as needed   Yes [provider]       Allergies:     Allergies   Allergen Reactions    Lisinopril Cough    Victoza [Liraglutide] Other (See Comments)     GI upset    Environmental Allergies Other (See Comments)     Sneezing      No Known Latex Allergy      Created by Conversion - 0;  Review of Systems:     As detailed above by problem.     Physical Exam:  Temp Readings from Last 3 Encounters:   10/09/22 36.6 C (97.8 F) (Temporal)   05/31/22 36.7 C (98 F)   02/13/22 36.8 C (98.2 F)     BP Readings from Last 3 Encounters:   10/09/22 142/80   05/31/22 132/80   02/13/22 130/80     Pulse Readings from Last 3 Encounters:   10/09/22 65   05/31/22 73   02/13/22 75         Vitals:    10/09/22 1337   BP: 142/80   BP Location: Left arm   Patient Position: Sitting   Cuff Size: large adult   Pulse: 65   Temp: 36.6 C (97.8 F)   TempSrc: Temporal   SpO2: 98%   Weight: 129.5 kg (285 lb 9.6 oz)   Height: 1.88 m (6\' 2" )     Wt Readings from Last 3 Encounters:   10/09/22 129.5 kg (285 lb 9.6 oz)   05/31/22 126.5 kg (278 lb 12.8 oz)   02/13/22 125.6 kg (277 lb)     General: Well-developed male, in no acute distress.  HEENT: Mallampati III.    Lungs: Breathing comfortably at rest.    Cardiovascular: RRR no  m/r/g.  Extremities warm and dry to touch.   Abdomen: Non-distended.  Sluggish bowel sounds.  Soft, non-tender.  Extremities: No edema, no rashes.  Slightly delayed reflexes in the biceps and brachioradialis bilaterally.    Neurologic: Fully interactive and cooperative with exam.  Able to provide an accurate history.  Speech and mood appropriate.      ASSESSMENT and Plan:     56 y.o. male with a history of HTN, HLD, DM, presenting for an acute visit for GI symptoms.  Plan by problem below.    1. Other fatigue   - Recommended updating labs to evaluate thyroid function, calcium  and sodium levels, renal and liver function, CBC to evaluate for anemia.  If all normal, then would have low threshold to refer for sleep testing to evaluate for sleep apnea.     2. Change in bowel habits   - Evaluating metabolic/endocrinologic etiologies above. If nothing obvious on labs then would consider lowering trulicity to increase GI motility.       Marjory Sneddon, MD 10/09/2022 1:45 PM

## 2022-10-10 ENCOUNTER — Other Ambulatory Visit: Payer: Self-pay | Admitting: Primary Care

## 2022-10-10 DIAGNOSIS — R5383 Other fatigue: Secondary | ICD-10-CM

## 2022-10-10 LAB — HEMOGLOBIN A1C: Hemoglobin A1C: 6.8 % — ABNORMAL HIGH

## 2022-10-10 MED ORDER — DULAGLUTIDE 1.5 MG/0.5ML SC SOAJ *I*
1.5000 mg | SUBCUTANEOUS | 5 refills | Status: DC
Start: 2022-10-10 — End: 2022-12-10

## 2022-10-11 ENCOUNTER — Telehealth: Payer: Self-pay | Admitting: Primary Care

## 2022-10-11 NOTE — Telephone Encounter (Signed)
-----   Message from Marjory Sneddon, MD sent at 10/10/2022  8:17 PM EDT -----  Please let patient know the diabetes looks fairly good with an A1c of 6.8 and the other labs look ok too.  Recommend the following.    [facility].  New rx for lower dose of trulicity 1.5 mg weekly.  Set up appointment to touch base with our pharmacist Ainsley Spinner within about a month to review sugars and treatment options based on those.     Marjory Sneddon, MD

## 2022-10-11 NOTE — Telephone Encounter (Signed)
Patient was informed per Dr. Brockway message and verbalized understanding.

## 2022-11-17 ENCOUNTER — Other Ambulatory Visit: Payer: Self-pay | Admitting: Primary Care

## 2022-11-19 NOTE — Telephone Encounter (Signed)
Last appointment: 10/09/2022   Next appointment: 12/03/2022  Labs done 10/09/2022

## 2022-11-23 ENCOUNTER — Other Ambulatory Visit: Payer: Self-pay | Admitting: Primary Care

## 2022-11-25 ENCOUNTER — Other Ambulatory Visit: Payer: Self-pay

## 2022-11-25 ENCOUNTER — Emergency Department
Admission: EM | Admit: 2022-11-25 | Discharge: 2022-11-26 | Disposition: A | Discharge status: Home / Self Care | Payer: Non-veteran care | Source: Ambulatory Visit | Attending: Student in an Organized Health Care Education/Training Program | Admitting: Student in an Organized Health Care Education/Training Program

## 2022-11-25 DIAGNOSIS — T161XXA Foreign body in right ear, initial encounter: Secondary | ICD-10-CM | POA: Insufficient documentation

## 2022-11-25 DIAGNOSIS — Y9289 Other specified places as the place of occurrence of the external cause: Secondary | ICD-10-CM | POA: Insufficient documentation

## 2022-11-25 DIAGNOSIS — Y998 Other external cause status: Secondary | ICD-10-CM | POA: Insufficient documentation

## 2022-11-25 DIAGNOSIS — Y9389 Activity, other specified: Secondary | ICD-10-CM | POA: Insufficient documentation

## 2022-11-25 DIAGNOSIS — W44G1XA Audio device entering into or through a natural orifice, initial encounter: Secondary | ICD-10-CM | POA: Insufficient documentation

## 2022-11-25 LAB — HM HIV SCREENING OFFERED

## 2022-11-25 NOTE — ED Triage Notes (Signed)
Patient states that he took his hearing aide out and the plastic piece got stuck in his right ear.

## 2022-11-26 NOTE — Discharge Instructions (Signed)
You were seen in the emergency department for foreign body in your ear.  It was removed.  Please return if any concerns

## 2022-11-26 NOTE — Telephone Encounter (Signed)
Last seen 10-09-22 and next apt 12-03-22.  A1c, CMP and urine microalbumin 10/23. All ordered with lipids.

## 2022-11-26 NOTE — ED Procedure Documentation (Signed)
Procedures   Foreign body removal    Date/Time: 11/25/2022 11:51 PM    Performed by: Leretha Pol, MD  Authorized by: Leretha Pol, MD    Consent:     Consent obtained:  Verbal    Consent given by:  Patient  Universal protocol:     Patient identity confirmed:  Verbally with patient  Location:     Location:  Ear    Ear location:  R ear  Pre-procedure details:     Imaging:  None  Anesthesia:     Anesthesia method:  None  Procedure type:     Procedure complexity:  Simple  Procedure details:     Localization method:  Visualized    Removal mechanism:  Forceps    Foreign bodies recovered:  1    Description:  Plastic cap    Intact foreign body removal: yes    Post-procedure details:     Confirmation:  No additional foreign bodies on visualization    Skin closure:  None    Dressing:  Open (no dressing)    Procedure completion:  Tolerated      Leretha Pol, MD     Bradlee Bridgers, Hilton Cork, MD  11/26/22 0009

## 2022-11-26 NOTE — ED Provider Notes (Signed)
History     Chief Complaint   Patient presents with    Foreign Body in Ear     Noah Craig is a 56 y.o. male presents the emergency department with foreign body in the right ear.  Patient reports he take his hearing aid out and the plastic Remained in the right ear.  He has no other complaints.              Medical/Surgical/Family History     Past Medical History:   Diagnosis Date    Arthritis     knees, hands    Colon polyp     Complication of anesthesia     slow to awaken    Diabetes mellitus     Hypertension         Patient Active Problem List   Diagnosis Code    Diabetes mellitus E11.9    Essential hypertension I10    Mixed hyperlipidemia E78.2    Strain of Achilles tendon, right, initial encounter S86.011A            Past Surgical History:   Procedure Laterality Date    JOINT REPLACEMENT      KNEE SURGERY Bilateral     reconstruction; several surgeries    TONSILLECTOMY            Social History     Tobacco Use    Smoking status: Former     Types: Cigarettes    Smokeless tobacco: Former     Quit date: 01/01/1992   Substance Use Topics    Alcohol use: Yes     Comment:  monthly    Drug use: No             Review of Systems    Physical Exam     Triage Vitals  Triage Start: Start, (11/25/22 2356)  First Recorded BP: 174/84, Resp: 18, Temp: 36.7 C (98.1 F), Temp src: TEMPORAL Oxygen Therapy SpO2: 98 %, Heart Rate: 69, (11/25/22 2355) Heart Rate (via Pulse Ox): 69, (11/25/22 2355).  First Pain Reported  0-10 Scale: 0, (11/25/22 2357)       Physical Exam  Vitals and nursing note reviewed.   Constitutional:       Appearance: Normal appearance.   HENT:      Ears:      Comments: Visualization of plastic In right ear canal, no edema or bleeding  Neurological:      Mental Status: He is alert.         Medical Decision Making   Patient seen by me on:  11/25/2022    Assessment:  Noah Craig is a 56 y.o. male presents emergency department with foreign body in the right ear.  Foreign body was visualized and successfully  removed.  Evaluation after removal shows no signs of trauma, bleeding.  Patient reports feeling well.  Appropriate for discharge      Differential diagnosis:  Foreign body, foreign body removal    Plan:  Removal of foreign body  Discharge                Leretha Pol, MD            Katesha Eichel, Hilton Cork, MD  11/26/22 0008

## 2022-11-30 ENCOUNTER — Other Ambulatory Visit
Admission: RE | Admit: 2022-11-30 | Discharge: 2022-11-30 | Disposition: A | Discharge status: Home / Self Care | Payer: No Typology Code available for payment source | Source: Ambulatory Visit | Attending: Primary Care | Admitting: Primary Care

## 2022-11-30 DIAGNOSIS — E119 Type 2 diabetes mellitus without complications: Secondary | ICD-10-CM | POA: Insufficient documentation

## 2022-11-30 DIAGNOSIS — Z125 Encounter for screening for malignant neoplasm of prostate: Secondary | ICD-10-CM | POA: Insufficient documentation

## 2022-11-30 DIAGNOSIS — Z794 Long term (current) use of insulin: Secondary | ICD-10-CM | POA: Insufficient documentation

## 2022-11-30 LAB — LIPID PANEL
Chol/HDL Ratio: 2.5
Cholesterol: 112 mg/dL
HDL: 44 mg/dL (ref 40–60)
LDL Calculated: 44 mg/dL
Non HDL Cholesterol: 68 mg/dL
Triglycerides: 122 mg/dL

## 2022-11-30 LAB — PSA (EFF.4-2010): PSA (eff. 4-2010): 1.26 ng/mL (ref 0.00–4.00)

## 2022-11-30 LAB — COMPREHENSIVE METABOLIC PANEL
ALT: 45 U/L (ref 0–50)
AST: 28 U/L (ref 0–50)
Albumin: 4.7 g/dL (ref 3.5–5.2)
Alk Phos: 105 U/L (ref 40–130)
Anion Gap: 12 (ref 7–16)
Bilirubin,Total: 0.6 mg/dL (ref 0.0–1.2)
CO2: 27 mmol/L (ref 20–28)
Calcium: 9.7 mg/dL (ref 8.6–10.2)
Chloride: 102 mmol/L (ref 96–108)
Creatinine: 1.17 mg/dL (ref 0.67–1.17)
Glucose: 184 mg/dL — ABNORMAL HIGH (ref 60–99)
Lab: 17 mg/dL (ref 6–20)
Potassium: 4.5 mmol/L (ref 3.3–5.1)
Sodium: 141 mmol/L (ref 133–145)
Total Protein: 7.4 g/dL (ref 6.3–7.7)
eGFR BY CREAT: 73 *

## 2022-12-02 LAB — HEMOGLOBIN A1C: Hemoglobin A1C: 6.9 % — ABNORMAL HIGH

## 2022-12-03 ENCOUNTER — Encounter: Payer: Self-pay | Admitting: Primary Care

## 2022-12-03 ENCOUNTER — Ambulatory Visit: Payer: No Typology Code available for payment source | Admitting: Primary Care

## 2022-12-03 ENCOUNTER — Other Ambulatory Visit: Payer: Self-pay

## 2022-12-03 ENCOUNTER — Telehealth: Payer: Self-pay | Admitting: Primary Care

## 2022-12-03 VITALS — BP 120/80 | HR 83 | Temp 96.7°F | Wt 285.6 lb

## 2022-12-03 DIAGNOSIS — E119 Type 2 diabetes mellitus without complications: Secondary | ICD-10-CM

## 2022-12-03 DIAGNOSIS — I10 Essential (primary) hypertension: Secondary | ICD-10-CM

## 2022-12-03 DIAGNOSIS — Z1211 Encounter for screening for malignant neoplasm of colon: Secondary | ICD-10-CM

## 2022-12-03 DIAGNOSIS — Z125 Encounter for screening for malignant neoplasm of prostate: Secondary | ICD-10-CM

## 2022-12-03 NOTE — Telephone Encounter (Signed)
Provided patient with contact number to Dr. Chrissie Noa - GI [phone].  Patient will call and schedule appointment.

## 2022-12-03 NOTE — Progress Notes (Signed)
[facility]  Outpatient Visit Note       Reason For Visit:   Chief Complaint   Patient presents with    Diabetes    Hypertension       Subjective:      Noah Craig is a 56 y.o. man with a history of HTN, HLD, DM with retinopathy, presenting for follow up.  Issues discussed below.    DM:  Regimen includes metformin 1000 mg AM and 500 mg PM; jardiance 25 mg daily, dulaglutide 1.5 mg weekly (constipation and GI upset at 3 mg dose); and short-acting insulin 10, 15, 15 units with breakfast, lunch, dinner (sometimes more if having a particularly carb-heavy intake).  Tolerating therapy well without hypoglycemic episodes or events.      HTN:  Remains on losartan 25 mg daily.  Renal function and electrolytes in acceptable limits.  No lightheadedness.      Home Medications:     Prior to Admission medications    Medication Sig Start Date End Date Taking? Authorizing Provider   metFORMIN (GLUCOPHAGE-XR) 500 mg 24 hr tablet take 2 tablets by mouth every morning and 1 tablet every evening 11/26/22  Yes Marjory Sneddon, MD   JARDIANCE 25 MG tablet take 1 tablet by mouth every morning 11/19/22  Yes Marjory Sneddon, MD   losartan (COZAAR) 25 mg tablet take 1 tablet by mouth once daily 11/19/22  Yes Marjory Sneddon, MD   dulaglutide (TRULICITY) 1.5 MG/0.5ML pen Inject 0.5 mLs (1.5 mg total) into the skin every 7 days 10/10/22  Yes Marjory Sneddon, MD   insulin pen needle [facility] UNIFINE PENTIPS PLUS) 31G X 6 MM USE THREE TIMES A DAY AS DIRECTED 08/31/22  Yes Guadalupe Maple, MD   Continuous Blood Gluc Sensor (DEXCOM G7 SENSOR) Apply sensor as directed. Change every 10 days. 06/07/22  Yes Marjory Sneddon, MD   insulin lispro (INSULIN LISPRO, 1 UNIT DIAL,) 100 UNIT/ML injection pen INJECT 10 units with breakfast, 15 units with lunch and 15 units with dinner.  DISCARD PEN 28 DAYS AFTER FIRST USE. MAXIMUM DAILY DOSE OF 40 UNITS 05/31/22  Yes Marjory Sneddon, MD   Continuous Blood Gluc Receiver (DEXCOM G7  RECEIVER) DEVI By 1 each no specified route continuously 02/19/22  Yes Darliss Ridgel, PA   naproxen sodium (ANAPROX) 220 MG tablet Take 1 tablet (220 mg total) by mouth 2 times daily (with meals)  Pt takes as needed   Yes [provider]   clindamycin (CLEOCIN T) 1 % lotion Apply topically 2 times daily  to the following areas: back of neck.  Use twice a day as needed for flares of folliculitis. 10/19/20   Marjory Sneddon, MD   sildenafil (REVATIO) 20 MG tablet 2-5 tab daily prn, taken 30-60 minutes before sexual activity 10/01/19   Guadalupe Maple, MD       Allergies:     Allergies   Allergen Reactions    Lisinopril Cough    Victoza [Liraglutide] Other (See Comments)     GI upset    Environmental Allergies Other (See Comments)     Sneezing      No Known Latex Allergy      Created by Conversion - 0;      Review of Systems:     As detailed above by problem.     Physical Exam:  Temp Readings from Last 3 Encounters:   12/03/22 35.9 C (96.7 F) (Temporal)   11/25/22 36.7 C (98.1 F) (Temporal)  10/09/22 36.6 C (97.8 F) (Temporal)     BP Readings from Last 3 Encounters:   12/03/22 120/80   11/25/22 174/84   10/09/22 142/80     Pulse Readings from Last 3 Encounters:   12/03/22 83   11/25/22 69   10/09/22 65         Vitals:    12/03/22 1510   BP: 120/80   BP Location: Left arm   Patient Position: Sitting   Cuff Size: adult   Pulse: 83   Temp: 35.9 C (96.7 F)   TempSrc: Temporal   SpO2: 96%   Weight: 129.5 kg (285 lb 9.6 oz)     Wt Readings from Last 3 Encounters:   12/03/22 129.5 kg (285 lb 9.6 oz)   11/25/22 124.7 kg (275 lb)   10/09/22 129.5 kg (285 lb 9.6 oz)     General: Well-developed male, in no acute distress.  Lungs: Breathing comfortably at rest.  Lungs CTAB without adventitious sounds.  Cardiovascular: RRR no m/r/g.  Extremities warm and dry to touch.   Extremities: No edema, no rashes.  Neurologic: Fully interactive and cooperative with exam.  Able to provide an accurate history.  Speech  and mood appropriate.      ASSESSMENT and Plan:     56 y.o. male with a history of HTN, HLD, DM with retinopathy, presenting for follow up.  Plan by problem below.    1. Diabetes mellitus   - At goal with A1c at 6.9.  No changes made to regimen of metformin, jardiance, trulicity and lispro.    2. Screening for colon cancer   - Update referral to Dr. Maisie Fus ordered (due ~June 2024).   3. Screening for prostate cancer   - PSA in normal limits - update again one year.    4. Primary hypertension   - At goal on repeat check.  Continue losartan 25 mg daily.      Marjory Sneddon, MD 12/03/2022 3:26 PM

## 2022-12-10 ENCOUNTER — Other Ambulatory Visit: Payer: Self-pay | Admitting: Primary Care

## 2022-12-10 DIAGNOSIS — E119 Type 2 diabetes mellitus without complications: Secondary | ICD-10-CM

## 2022-12-10 MED ORDER — DULAGLUTIDE 1.5 MG/0.5ML SC SOAJ *I*
1.5000 mg | SUBCUTANEOUS | 5 refills | Status: DC
Start: 2022-12-10 — End: 2022-12-28

## 2022-12-10 NOTE — Telephone Encounter (Signed)
Last seen 12-03-22 and next apt 06-10-23. CMP, A1c, lipids 12/23, urine microlabumin 10/23. All ordered.

## 2022-12-10 NOTE — Telephone Encounter (Signed)
Last seen 12-03-22 and next apt 06-10-23. CMP, A1c, lipids 12/23, urine microlabumin 10/23. All ordered.

## 2022-12-28 ENCOUNTER — Telehealth: Payer: Self-pay | Admitting: Primary Care

## 2022-12-28 MED ORDER — SEMAGLUTIDE (0.25 OR 0.5MG/DOSE) 2 MG/3ML SC SOPN *I*
0.5000 mg | PEN_INJECTOR | SUBCUTANEOUS | 2 refills | Status: DC
Start: 2022-12-28 — End: 2023-01-01

## 2022-12-28 NOTE — Telephone Encounter (Signed)
Pt's pharmacy can not get the Trulicity 1.5 mg dose in. He has been trying for over 3 weeks, they have other doses he says, just not that one. What are his other options? Please advise    (Pharmacy is currently closed for a meal break till 2pm and he is unsure what other doses that have in stock)

## 2022-12-28 NOTE — Telephone Encounter (Signed)
Spoke with patient.  Per Dr. Matthias Hughs, informed patient of the message below:    "Would try alternative of ozempic 0.5 mg weekly.  Script sent in for that."    Patient verbalized understanding.

## 2022-12-28 NOTE — Telephone Encounter (Signed)
Would try alternative of ozempic 0.5 mg weekly.  Script sent in for that.    Marjory Sneddon, MD

## 2023-01-01 ENCOUNTER — Telehealth: Payer: Self-pay | Admitting: Primary Care

## 2023-01-01 MED ORDER — SEMAGLUTIDE (0.25 OR 0.5MG/DOSE) 2 MG/3ML SC SOPN *I*
0.5000 mg | PEN_INJECTOR | SUBCUTANEOUS | 2 refills | Status: DC
Start: 2023-01-01 — End: 2023-03-25

## 2023-01-01 NOTE — Telephone Encounter (Addendum)
Received a fax from [facility] for semaglutide, 0.25 or 0.5 mg dose.    Please confirm directions:   These directions are usually for Trulicity - usual dosing for starting ozempic is 0.25 mg SQ weekly for 4 weeks.  Then increase to 0.5 mg SQ weekly.    Thank you, [facility]

## 2023-01-01 NOTE — Telephone Encounter (Signed)
Starting at the 0.5 mg dose since he is already coming from another GLP-1 which was unavailable at the time of ordering.    Marjory Sneddon, MD

## 2023-01-01 NOTE — Telephone Encounter (Signed)
Spoke with Noah Craig.  Per Dr. Matthias Hughs, informed her of the message below:    "Starting at the 0.5 mg dose since he is already coming from another GLP-1 which was unavailable at the time of ordering."    Noah Craig verbalized understanding.

## 2023-01-07 LAB — HM DIABETES EYE EXAM

## 2023-01-11 ENCOUNTER — Other Ambulatory Visit: Payer: Self-pay | Admitting: Primary Care

## 2023-01-11 DIAGNOSIS — E119 Type 2 diabetes mellitus without complications: Secondary | ICD-10-CM

## 2023-01-11 NOTE — Telephone Encounter (Signed)
Last seen 12-03-22 and next apt 06-10-23. CMP, A1c, lipids 12/23.  All ordered with urine microalbumin.

## 2023-01-17 ENCOUNTER — Encounter: Payer: Self-pay | Admitting: Primary Care

## 2023-03-23 ENCOUNTER — Other Ambulatory Visit: Payer: Self-pay | Admitting: Primary Care

## 2023-03-25 NOTE — Telephone Encounter (Signed)
Last seen 12-03-22 and next apt 06-10-23. CMP, A1c,lipids 12/23 and all ordered with urine microalbumin.

## 2023-04-23 ENCOUNTER — Encounter: Payer: Self-pay | Admitting: Gastroenterology

## 2023-05-07 ENCOUNTER — Other Ambulatory Visit: Payer: Self-pay | Admitting: Primary Care

## 2023-05-07 DIAGNOSIS — E119 Type 2 diabetes mellitus without complications: Secondary | ICD-10-CM

## 2023-05-08 NOTE — Telephone Encounter (Signed)
Last seen 12-03-22 and next apt 06-10-23. Lipids, A1c, CMP 12/23 and all ordered with urine microalbumin.

## 2023-05-21 ENCOUNTER — Other Ambulatory Visit: Payer: Self-pay | Admitting: Primary Care

## 2023-05-21 NOTE — Telephone Encounter (Signed)
Refill request    Medication:   Requested Prescriptions     Pending Prescriptions Disp Refills    JARDIANCE 25 MG tablet [Pharmacy Med Name: JARDIANCE 25 MG TABLET] 90 tablet 1     Sig: take 1 tablet by mouth every morning    losartan (COZAAR) 25 mg tablet [Pharmacy Med Name: LOSARTAN POTASSIUM 25 MG TAB] 90 tablet 1     Sig: take 1 tablet by mouth once daily         Confirm that the patient wants the medication sent to:   [id number] [place], [place] - [address]  [address] [place] [address]  Phone: [phone] Fax: [phone]        Additional message:     Relevant labs:  ACEi/ARB/diuretic (BMP)       Lab results: 11/30/22  1417   Sodium 141   Potassium 4.5   Chloride 102   CO2 27   UN 17   Creatinine 1.17   Glucose 184*   Calcium 9.7      Diabetes medication (HgBA1c, BMP, last urine microalbumin)      Lab results: 11/30/22  1417   Sodium 141   Potassium 4.5   Chloride 102   CO2 27   UN 17   Creatinine 1.17   Glucose 184*   Calcium 9.7      Microalbumin,UR (mg/dL)   Date Value   69/62/9528 2.91      Hemoglobin A1C (%)   Date Value   11/30/2022 6.9 (H)     Last visit: 12/03/2022   Next visit: Future Encounters      06/10/2023 10:00 AM [facility]    FOLLOW UP VISIT    PAN    Marjory Sneddon, MD                     Additional information to be completed by clinical staff:  If the request is for a controlled substance please paste the iStop results below:      **If the refill protocol indicates they are due for labs please order them and tell the patient to have them drawn**

## 2023-06-02 ENCOUNTER — Other Ambulatory Visit: Payer: Self-pay | Admitting: Primary Care

## 2023-06-03 NOTE — Telephone Encounter (Signed)
Last seen 12-03-22 and next apt 06-10-23. CMP, lipids, A1c, urine microalbumin 12/23 all ordered.

## 2023-06-04 ENCOUNTER — Ambulatory Visit: Payer: No Typology Code available for payment source

## 2023-06-04 VITALS — BP 130/80 | HR 82 | Temp 98.3°F | Ht 74.0 in | Wt 269.0 lb

## 2023-06-04 DIAGNOSIS — R051 Acute cough: Secondary | ICD-10-CM

## 2023-06-04 MED ORDER — AZITHROMYCIN 250 MG PO TABS *I*
ORAL_TABLET | ORAL | 0 refills | Status: AC
Start: 2023-06-04 — End: 2023-06-09

## 2023-06-04 NOTE — Progress Notes (Signed)
Subjective:  Chief Complaint   Patient presents with    Cough        Patient ID: Noah Craig is a 57 y.o. male presents for an acute appointment.   Having coughing fits for a week now. Bringing up phlegm sometimes yellow to thick and gray. That it was allergies at first. Feeling congestion in the chest. Runny nose and some sinus symptoms. Mostly clear discharge through the nose.   Denies fever and chills.   Denies chest pain. Shortness of breath when he over exerts himself. Prolonged heavy physical activity.     Did not test for COVID.     Mucinex has been somewhat helpful with the coughing fits.     Past Medical History:   Diagnosis Date    Arthritis     knees, hands    Colon polyp     Complication of anesthesia     slow to awaken    Diabetes mellitus     Hypertension      Current Outpatient Medications on File Prior to Visit   Medication Sig Dispense Refill    metFORMIN (GLUCOPHAGE-XR) 500 mg 24 hr tablet take 2 tablets by mouth every morning and 1 tablet every evening (Patient taking differently: take 1 tablets by mouth every morning and 2 tablet every evening) 270 tablet 1    JARDIANCE 25 MG tablet take 1 tablet by mouth every morning 90 tablet 1    losartan (COZAAR) 25 mg tablet take 1 tablet by mouth once daily 90 tablet 1    DROPLET PEN NEEDLES 31G X 6 MM use 1 PEN NEEDLE to inject MEDICATION subcutaneously three times a day as directed 200 each 1    OZEMPIC, 0.25 OR 0.5 MG/DOSE, pen inject 0.5 milligram subcutaneously every week 3 mL 2    Continuous Blood Gluc Sensor (DEXCOM G7 SENSOR) APPLY SENSOR AS DIRECTED, CHANGE EVERY 10 DAYS 3 each 5    insulin lispro (INSULIN LISPRO, 1 UNIT DIAL,) 100 UNIT/ML injection pen INJECT 10 units with breakfast, 15 units with lunch and 15 units with dinner.  DISCARD PEN 28 DAYS AFTER FIRST USE. MAXIMUM DAILY DOSE OF 40 UNITS 46 mL 5    Continuous Blood Gluc Receiver (DEXCOM G7 RECEIVER) DEVI By 1 each no specified route continuously 1 each 5    naproxen sodium (ANAPROX) 220  MG tablet Take 1 tablet (220 mg total) by mouth 2 times daily (with meals). Pt takes as needed      clindamycin (CLEOCIN T) 1 % lotion Apply topically 2 times daily  to the following areas: back of neck.  Use twice a day as needed for flares of folliculitis. 60 mL 0    sildenafil (REVATIO) 20 MG tablet 2-5 tab daily prn, taken 30-60 minutes before sexual activity 40 tablet 5     No current facility-administered medications on file prior to visit.       Review of Systems   All other systems reviewed and are negative.          Objective:  BP 130/80 (BP Location: Left arm, Patient Position: Sitting, Cuff Size: large adult)   Pulse 82   Temp 36.8 C (98.3 F) (Temporal)   Ht 1.88 m (6\' 2" )   Wt 122 kg (269 lb)   SpO2 98%   BMI 34.54 kg/m   Physical Exam  Constitutional:       General: He is not in acute distress.     Appearance: He is not ill-appearing  or diaphoretic.   HENT:      Right Ear: Tympanic membrane, ear canal and external ear normal.      Left Ear: Tympanic membrane, ear canal and external ear normal.      Nose: Rhinorrhea present.      Mouth/Throat:      Mouth: Mucous membranes are moist.      Pharynx: Oropharynx is clear. No oropharyngeal exudate or posterior oropharyngeal erythema.   Eyes:      Conjunctiva/sclera: Conjunctivae normal.   Cardiovascular:      Rate and Rhythm: Normal rate and regular rhythm.      Pulses: Normal pulses.      Heart sounds: Normal heart sounds. No murmur heard.     No gallop.   Pulmonary:      Effort: Pulmonary effort is normal. No respiratory distress.      Breath sounds: Normal breath sounds. No wheezing, rhonchi or rales.   Lymphadenopathy:      Cervical: No cervical adenopathy.   Skin:     General: Skin is warm.   Neurological:      Mental Status: He is alert.             Assessment/Plan:        ICD-10-CM ICD-9-CM   1. Acute cough  R05.1 786.2      Symptom of cough with associated rhinorrhea, sore throat, and exertional shortness of breath with prolonged heavy  physical activity.  All vital signs are stable.  No focal findings on lung exam.  Differential includes rhinosinusitis, viral illness, allergies, and bronchitis.  Given the history of his symptoms I suspect it is most likely a viral bronchitis however he has a history of diabetes and has a productive cough so would like to cover for atypicals with azithromycin.  He may continue Mucinex and add on Robitussin or Delsym.  Advised him to contact the office if symptoms persist or worsen.    Eustaquio Maize, NP     This note was written with the assistance of Dragon dictation software.  There may be inadvertent errors in spelling or word selection despite review of the document.  Apologies are made in advance of such errors.

## 2023-06-10 ENCOUNTER — Other Ambulatory Visit: Payer: Self-pay

## 2023-06-10 ENCOUNTER — Ambulatory Visit: Payer: No Typology Code available for payment source | Admitting: Primary Care

## 2023-06-10 ENCOUNTER — Encounter: Payer: Self-pay | Admitting: Primary Care

## 2023-06-10 VITALS — BP 162/88 | HR 63 | Temp 97.6°F | Ht 74.0 in | Wt 276.9 lb

## 2023-06-10 DIAGNOSIS — E782 Mixed hyperlipidemia: Secondary | ICD-10-CM

## 2023-06-10 DIAGNOSIS — Z794 Long term (current) use of insulin: Secondary | ICD-10-CM

## 2023-06-10 DIAGNOSIS — E11319 Type 2 diabetes mellitus with unspecified diabetic retinopathy without macular edema: Secondary | ICD-10-CM

## 2023-06-10 DIAGNOSIS — I10 Essential (primary) hypertension: Secondary | ICD-10-CM

## 2023-06-10 DIAGNOSIS — R051 Acute cough: Secondary | ICD-10-CM

## 2023-06-10 NOTE — Progress Notes (Signed)
[facility]  Outpatient Visit Note       Reason For Visit:   Chief Complaint   Patient presents with    Follow-up     DM, HTN       Subjective:      Noah Craig is a 57 y.o. man with a history of DM, HTN, HLD, presenting for follow up.  Issues discussed below.    Cough:  Takes zyrtec and flonase for seasonal allergies.  He was seen last week in the office and was prescribed a course of azithromycin.  His cough is slowly improving, but he is still producing some mucus.    DM:  Sugars have been up and down.  Can go a week or two and they'll look perfect.  Tolerating the change to ozempic well.  Taking 0.5 mg per week currently, along with metformin 1000 mg twice a day and Jardiance 25 mg/day.  He will be updating his labs in the near future.    HTN:  No lightheadedness.  Taking losartan 25 mg daily.  Last week his blood pressure was at target on this regimen.      Home Medications:     Prior to Admission medications    Medication Sig Start Date End Date Taking? Authorizing Provider   metFORMIN (GLUCOPHAGE-XR) 500 mg 24 hr tablet take 2 tablets by mouth every morning and 1 tablet every evening  Patient taking differently: take 1 tablets by mouth every morning and 2 tablet every evening 06/03/23  Yes Marjory Sneddon, MD   JARDIANCE 25 MG tablet take 1 tablet by mouth every morning 05/21/23  Yes Marjory Sneddon, MD   losartan (COZAAR) 25 mg tablet take 1 tablet by mouth once daily 05/21/23  Yes Marjory Sneddon, MD   DROPLET PEN NEEDLES 31G X 6 MM use 1 PEN NEEDLE to inject MEDICATION subcutaneously three times a day as directed 05/08/23  Yes Mellody Memos, MD   OZEMPIC, 0.25 OR 0.5 MG/DOSE, pen inject 0.5 milligram subcutaneously every week 03/25/23  Yes Marjory Sneddon, MD   Continuous Blood Gluc Sensor (DEXCOM G7 SENSOR) APPLY SENSOR AS DIRECTED, CHANGE EVERY 10 DAYS 12/10/22  Yes Marjory Sneddon, MD   insulin lispro (INSULIN LISPRO, 1 UNIT DIAL,) 100 UNIT/ML injection pen INJECT 10 units  with breakfast, 15 units with lunch and 15 units with dinner.  DISCARD PEN 28 DAYS AFTER FIRST USE. MAXIMUM DAILY DOSE OF 40 UNITS 05/31/22  Yes Marjory Sneddon, MD   Continuous Blood Gluc Receiver (DEXCOM G7 RECEIVER) DEVI By 1 each no specified route continuously 02/19/22  Yes Darliss Ridgel, PA   clindamycin (CLEOCIN T) 1 % lotion Apply topically 2 times daily  to the following areas: back of neck.  Use twice a day as needed for flares of folliculitis. 10/19/20  Yes Marjory Sneddon, MD   sildenafil (REVATIO) 20 MG tablet 2-5 tab daily prn, taken 30-60 minutes before sexual activity 10/01/19  Yes Guadalupe Maple, MD   naproxen sodium (ANAPROX) 220 MG tablet Take 1 tablet (220 mg total) by mouth 2 times daily (with meals). Pt takes as needed   Yes [provider]       Allergies:     Allergies   Allergen Reactions    Lisinopril Cough    Victoza [Liraglutide] Other (See Comments)     GI upset    Environmental Allergies Other (See Comments)     Sneezing      No Known Latex Allergy  Created by Conversion - 0;      Review of Systems:     As detailed above by problem.     Physical Exam:  Temp Readings from Last 3 Encounters:   06/10/23 36.4 C (97.6 F) (Temporal)   06/04/23 36.8 C (98.3 F) (Temporal)   12/03/22 35.9 C (96.7 F) (Temporal)     BP Readings from Last 3 Encounters:   06/10/23 162/88   06/04/23 130/80   12/03/22 120/80     Pulse Readings from Last 3 Encounters:   06/10/23 63   06/04/23 82   12/03/22 83         Vitals:    06/10/23 1013   BP: 162/88   BP Location: Left arm   Patient Position: Sitting   Cuff Size: large adult   Pulse: 63   Temp: 36.4 C (97.6 F)   TempSrc: Temporal   SpO2: 99%   Weight: 125.6 kg (276 lb 14.4 oz)   Height: 1.88 m (6\' 2" )     Wt Readings from Last 3 Encounters:   06/10/23 125.6 kg (276 lb 14.4 oz)   06/04/23 122 kg (269 lb)   12/03/22 129.5 kg (285 lb 9.6 oz)     General: Well-developed male, in no acute distress.  HEENT: Sclerae anicteric.  Oropharynx  moist, pink, without exudate.  No adenopathy.  Lungs: Breathing comfortably at rest.  Lungs mostly clear but does have some faint rales at the left base.    Cardiovascular: RRR no m/r/g.  Extremities warm and dry to touch.   Abdomen: Non-distended.  Normoactive bowel sounds.  Soft, non-tender.  Extremities: Diabetic foot exam: Readily palpable DP and PT pulses bilaterally.  Skin intact without ulceration. 10/10 monofilament sites tested intact bilaterally.    Neurologic: Fully interactive and cooperative with exam.  Able to provide an accurate history.  Speech and mood appropriate.      ASSESSMENT and Plan:     57 y.o. male with a history of DM, HTN, HLD, presenting for follow up.  Plan by problem below.    1. Type 2 diabetes mellitus with retinopathy, with long-term current use of insulin, macular edema presence unspecified, unspecified laterality, unspecified retinopathy severity   - Continue current regimen.  He has been losing weight on the ozempic and will be getting more active over the summer months.  Continue metformin and jardiance at current doses.  If A1c over goal, then can increase the ozempic dose to 1 mg per week.  Foot exam updated today.    2. Essential hypertension   - At goal in office last week - this one seems spurious today especially with recent weight loss over the last few months.    3. Mixed hyperlipidemia   - Lipids look great in the absence of statin therapy in the past.  Continue to monitor on labs.    4. Acute cough   - Since it is improving, advised to monitor for a few more days this week since he was recently treated with antibiotic therapy.  If not continuing to improve then would prescribe at 5-day course of amoxicillin since he does have some rales at the left base on exam.       Marjory Sneddon, MD 06/10/2023 10:21 AM

## 2023-06-16 ENCOUNTER — Other Ambulatory Visit: Payer: Self-pay | Admitting: Primary Care

## 2023-06-17 MED ORDER — OZEMPIC (0.25 OR 0.5 MG/DOSE) 2 MG/3ML SC SOPN
0.5000 mg | PEN_INJECTOR | SUBCUTANEOUS | 2 refills | Status: DC
Start: 2023-06-17 — End: 2023-08-04

## 2023-06-17 NOTE — Telephone Encounter (Signed)
Last Telemed:    Visit date not found     Last office visit:     06/10/2023    Last PA office visit: 02/13/2022    Patients upcoming appointments:   Future Appointments   Date Time Provider Department Center   12/13/2023  3:00 PM Marjory Sneddon, MD [facility] None       Opioid Drug Screen:  No results for input(s): "AMPU", "BEU", "OPSU", "OXYU", "THCU", "BZDU", "COPS", "CTHC", "UCRNC" in the last 8760 hours.    Last dispensed if controlled:

## 2023-07-01 ENCOUNTER — Other Ambulatory Visit: Payer: Self-pay | Admitting: Primary Care

## 2023-07-01 DIAGNOSIS — E119 Type 2 diabetes mellitus without complications: Secondary | ICD-10-CM

## 2023-07-01 MED ORDER — DEXCOM G7 SENSOR MISC *A*
5 refills | Status: DC
Start: 2023-07-01 — End: 2023-08-03

## 2023-07-01 NOTE — Telephone Encounter (Signed)
Last office visit:   06/10/2023  Last telehome visit:   Visit date not found  Patients upcoming appointments:  Future Appointments   Date Time Provider Department Center   12/13/2023  3:00 PM Marjory Sneddon, MD [facility] None     BP Readings from Last 3 Encounters:   06/10/23 162/88   06/04/23 130/80   12/03/22 120/80      Recent Lab results:  GENERAL CHEMISTRY   Recent Labs     11/30/22  1417 10/09/22  1445   NA 141 140   K 4.5 4.2   CL 102 104   CO2 27 27   GAP 12 9   UN 17 10   CREAT 1.17 1.11   GLU 184* 107*   CA 9.7 9.4      LIPID PROFILE   Recent Labs     11/30/22  1417   CHOL 112   TRIG 122   HDL 44   LDLC 44      LIVER PROFILE   Recent Labs     11/30/22  1417 10/09/22  1445   ALT 45 48   AST 28 29   ALK 105 98   TB 0.6 0.6      DIABETES THYROID   Recent Labs     11/30/22  1417 10/09/22  1445   HA1C 6.9* 6.8*    Recent Labs     10/09/22  1445   TSH 1.80         Pending/Orders Labs:  Lab Frequency Next Occurrence   PSA (eff.03-2009) Once 11/28/2023   Lipid Panel (Reflex to Direct  LDL if Triglycerides more than 400) Once 06/04/2023   Hemoglobin A1c Once 06/04/2023   CBC Once 06/04/2023   Comprehensive metabolic panel Once 06/04/2023   Vitamin D Once 06/04/2023   Lipid Panel (Reflex to Direct  LDL if Triglycerides more than 400) Once 12/10/2023   Hemoglobin A1c Once 12/10/2023   CBC Once 12/10/2023   Comprehensive metabolic panel Once 12/10/2023   TSH Once 12/10/2023   Vitamin D Once 12/10/2023   Microalbumin, Urine, Random Once 12/10/2023

## 2023-08-03 ENCOUNTER — Other Ambulatory Visit: Payer: Self-pay | Admitting: Primary Care

## 2023-08-03 DIAGNOSIS — E119 Type 2 diabetes mellitus without complications: Secondary | ICD-10-CM

## 2023-08-04 ENCOUNTER — Other Ambulatory Visit: Payer: Self-pay | Admitting: Primary Care

## 2023-08-05 MED ORDER — DEXCOM G7 SENSOR MISC *A*
5 refills | Status: DC
Start: 2023-08-05 — End: 2024-02-12

## 2023-08-05 MED ORDER — OZEMPIC (0.25 OR 0.5 MG/DOSE) 2 MG/3ML SC SOPN
0.5000 mg | PEN_INJECTOR | SUBCUTANEOUS | 2 refills | Status: DC
Start: 2023-08-05 — End: 2023-10-10

## 2023-08-05 NOTE — Telephone Encounter (Signed)
Last office visit:   06/10/2023  Last telehome visit:   Visit date not found  Patients upcoming appointments:  Future Appointments   Date Time Provider Department Center   12/13/2023  3:00 PM Marjory Sneddon, MD [facility] None     BP Readings from Last 3 Encounters:   06/10/23 162/88   06/04/23 130/80   12/03/22 120/80      Recent Lab results:  GENERAL CHEMISTRY   Recent Labs     11/30/22  1417 10/09/22  1445   NA 141 140   K 4.5 4.2   CL 102 104   CO2 27 27   GAP 12 9   UN 17 10   CREAT 1.17 1.11   GLU 184* 107*   CA 9.7 9.4      LIPID PROFILE   Recent Labs     11/30/22  1417   CHOL 112   TRIG 122   HDL 44   LDLC 44      LIVER PROFILE   Recent Labs     11/30/22  1417 10/09/22  1445   ALT 45 48   AST 28 29   ALK 105 98   TB 0.6 0.6      DIABETES THYROID   Recent Labs     11/30/22  1417 10/09/22  1445   HA1C 6.9* 6.8*    Recent Labs     10/09/22  1445   TSH 1.80         Pending/Orders Labs:  Lab Frequency Next Occurrence   PSA (eff.03-2009) Once 11/28/2023   Lipid Panel (Reflex to Direct  LDL if Triglycerides more than 400) Once 06/04/2023   Hemoglobin A1c Once 06/04/2023   CBC Once 06/04/2023   Comprehensive metabolic panel Once 06/04/2023   Vitamin D Once 06/04/2023   Lipid Panel (Reflex to Direct  LDL if Triglycerides more than 400) Once 12/10/2023   Hemoglobin A1c Once 12/10/2023   CBC Once 12/10/2023   Comprehensive metabolic panel Once 12/10/2023   TSH Once 12/10/2023   Vitamin D Once 12/10/2023   Microalbumin, Urine, Random Once 12/10/2023

## 2023-08-22 ENCOUNTER — Encounter: Payer: Self-pay | Admitting: Gastroenterology

## 2023-08-23 ENCOUNTER — Other Ambulatory Visit: Payer: Self-pay | Admitting: Otolaryngology

## 2023-08-23 DIAGNOSIS — H905 Unspecified sensorineural hearing loss: Secondary | ICD-10-CM

## 2023-08-26 ENCOUNTER — Other Ambulatory Visit: Payer: Self-pay | Admitting: Gastroenterology

## 2023-08-27 ENCOUNTER — Telehealth: Payer: Self-pay | Admitting: Primary Care

## 2023-08-27 NOTE — Telephone Encounter (Signed)
He has an appt coming up for ENT/Having tubes put in his hears.   Barb from ENT called [phone] to schedule his pre op.   I did call  [facility] as well and he wants to make sure billing wise, this is done correctly, as this should be filled to the [place] as this is service related.   The pre op scheduled with Dr. Matthias Hughs on Oct 9th, needs to be billed to the [facility].  How do we make sure this happens.

## 2023-08-30 ENCOUNTER — Other Ambulatory Visit
Admission: RE | Admit: 2023-08-30 | Discharge: 2023-08-30 | Disposition: A | Discharge status: Home / Self Care | Payer: No Typology Code available for payment source | Source: Ambulatory Visit | Attending: Internal Medicine | Admitting: Internal Medicine

## 2023-08-30 DIAGNOSIS — Z1211 Encounter for screening for malignant neoplasm of colon: Secondary | ICD-10-CM | POA: Insufficient documentation

## 2023-08-30 LAB — SODIUM: Sodium: 140 mmol/L (ref 133–145)

## 2023-08-30 LAB — CREATININE, SERUM
Creatinine: 1.11 mg/dL (ref 0.67–1.17)
eGFR BY CREAT: 78 *

## 2023-08-30 LAB — POTASSIUM: Potassium: 4.1 mmol/L (ref 3.3–5.1)

## 2023-08-30 LAB — BUN: Lab: 18 mg/dL (ref 6–20)

## 2023-09-04 NOTE — Telephone Encounter (Signed)
I'm not aware of VA-specific coding for office visits, but I would have him confirm with his VA insurance in advance.  I expect it's more from the insurance side of things after the visit is billed.       Marjory Sneddon, MD

## 2023-09-04 NOTE — Telephone Encounter (Signed)
Left VM advising per Dr.Brockway:    "  I'm not aware of VA-specific coding for office visits, but I would have him confirm with his VA insurance in advance.  I expect it's more from the insurance side of things after the visit is billed      "

## 2023-09-16 NOTE — Preop H&P (Signed)
[facility] Ambulatory Procedure      PATIENT: Noah Craig MR #: [id number]   PCP: Marjory Sneddon, MD DOB: 01/09/1966   AUTHORED BY: Dionne Ano, MD PROCEDURE DATE: 09/23/2023       Chief Complaint: Asymptomatic Screening Colonoscopy and History Of Polyps    History of Present Illness  Pt is a 57 y.o.  male presents [facility] for colonoscopy.  Past Medical History:   Diagnosis Date    Arthritis     knees, hands    Colon polyp     Complication of anesthesia     slow to awaken    Diabetes mellitus     Hypertension      Past Surgical History:   Procedure Laterality Date    JOINT REPLACEMENT      KNEE SURGERY Bilateral     reconstruction; several surgeries    TONSILLECTOMY       Allergies   Allergen Reactions    Lisinopril Cough    Victoza [Liraglutide] Other (See Comments)     GI upset    Environmental Allergies Other (See Comments)     Sneezing      No Known Latex Allergy      Created by Conversion - 0;      Current Outpatient Medications   Medication    Continuous Glucose Sensor (DEXCOM G7 SENSOR)    semaglutide, 0.25 or 0.5 mg/dose, (OZEMPIC, 0.25 OR 0.5 MG/DOSE,) pen    metFORMIN (GLUCOPHAGE-XR) 500 mg 24 hr tablet    JARDIANCE 25 MG tablet    losartan (COZAAR) 25 mg tablet    DROPLET PEN NEEDLES 31G X 6 MM    insulin lispro (INSULIN LISPRO, 1 UNIT DIAL,) 100 UNIT/ML injection pen    Continuous Blood Gluc Receiver (DEXCOM G7 RECEIVER) DEVI    clindamycin (CLEOCIN T) 1 % lotion    sildenafil (REVATIO) 20 MG tablet    naproxen sodium (ANAPROX) 220 MG tablet     No current facility-administered medications for this encounter.     Social History     Socioeconomic History    Marital status: Married   Tobacco Use    Smoking status: Former     Types: Cigarettes    Smokeless tobacco: Former     Quit date: 01/01/1992   Substance and Sexual Activity    Alcohol use: Yes     Comment:  monthly    Drug use: No       Family History   Problem Relation Age of Onset    Heart Disease Father          Review of  Systems:  Pertinent items are noted in HPI.    Physical Exam:  There were no vitals filed for this visit.  Neuro: A+O x 3  Chest: CTA  Cor: RRR  Abdomen is soft, nontender, nondistended with no palpable masses.     Assessment and Plan:  I discussed the risks, benefits, and alternatives for the procedure.   Plan for Colonoscopy  ASA 2  Oral airway satisfactory      Electronically signed by: Dionne Ano, MD

## 2023-09-23 ENCOUNTER — Other Ambulatory Visit: Payer: Self-pay

## 2023-09-23 ENCOUNTER — Ambulatory Visit
Admission: RE | Admit: 2023-09-23 | Discharge: 2023-09-23 | Disposition: A | Discharge status: Home / Self Care | Payer: No Typology Code available for payment source | Source: Ambulatory Visit | Attending: Internal Medicine | Admitting: Internal Medicine

## 2023-09-23 ENCOUNTER — Encounter: Payer: Self-pay | Admitting: Internal Medicine

## 2023-09-23 DIAGNOSIS — Z86010 Personal history of colonic polyps: Secondary | ICD-10-CM | POA: Insufficient documentation

## 2023-09-23 DIAGNOSIS — Z1211 Encounter for screening for malignant neoplasm of colon: Secondary | ICD-10-CM | POA: Insufficient documentation

## 2023-09-23 DIAGNOSIS — K5730 Diverticulosis of large intestine without perforation or abscess without bleeding: Secondary | ICD-10-CM | POA: Insufficient documentation

## 2023-09-23 DIAGNOSIS — K648 Other hemorrhoids: Secondary | ICD-10-CM | POA: Insufficient documentation

## 2023-09-23 LAB — HM COLONOSCOPY

## 2023-09-23 MED ORDER — MIDAZOLAM HCL 5 MG/ML IJ SOLUTION *WRAPPED*
INTRAMUSCULAR | Status: AC | PRN
Start: 2023-09-23 — End: 2023-09-23
  Administered 2023-09-23: 5 mg via INTRAVENOUS

## 2023-09-23 MED ORDER — FENTANYL CITRATE 50 MCG/ML IJ SOLN *WRAPPED*
INTRAMUSCULAR | Status: AC | PRN
Start: 2023-09-23 — End: 2023-09-23
  Administered 2023-09-23: 50 ug via INTRAVENOUS

## 2023-09-23 MED ORDER — FENTANYL CITRATE 50 MCG/ML IJ SOLN *WRAPPED*
INTRAMUSCULAR | Status: AC
Start: 2023-09-23 — End: 2023-09-23
  Filled 2023-09-23: qty 2

## 2023-09-23 MED ORDER — MIDAZOLAM HCL 5 MG/ML IJ SOLUTION *WRAPPED*
INTRAMUSCULAR | Status: AC
Start: 2023-09-23 — End: 2023-09-23
  Filled 2023-09-23: qty 2

## 2023-09-23 NOTE — Discharge Instructions (Addendum)
Discharge Instructions: Colonoscopy    Procedure: Colonoscopy      Special Instructions:  You have received medication that has a sedating/amnesic effect and therefore interferes with your memory and normal reaction time. DO NOT operate motor vehicles, machinery or power tools for 24 hours. Do not sign any legal documents for 24 hours. Do not make any major decisions. Do not consume alcohol. Rest at home with only light activity for the rest of today and resume normal activities tomorrow. If you use a CPAP machine for sleep, use your CPAP when you are resting at home after the procedure.  If you develop abdominal pain, nausea or vomiting, fever, chills, bleeding that does not stop, contact your physician.   Call your physician for any persistent changes in bowel habits: (i.e., diarrhea, constipation, bloody stools change in stool shape or size or cramping.     Medications:  Resume all previous medications unless instructed as above.  New Medications No New Medications    Follow-Up Care:  Have stools tested for blood yearly with annual physical.  Follow up colonoscopy in 7 years time  Instructional pamphlets: Diverticular  No Aspirin or NSAIDs (ie Motrin, Ibuprofen, Aleve, Naproxen, ect) for 3 days.    Diet: regular     Follow-Up Appointment: an office visit as needed for new problems.    If You Have A Problem:      Dr. Takari Lundahl: [phone]    [facility]     Emergency Department  [phone]    [phone]  7AM to 4PM weekdays   24 hours per day      Electronically signed by: Nadina Fomby M Authur Cubit, MD

## 2023-09-23 NOTE — Procedures (Signed)
Procedure Report    [facility] Ambulatory Procedure      PATIENT: Noah Craig MR #: [id number]   PCP: Marjory Sneddon, MD DOB: [birth year]   DICTATED BY: Dionne Ano, MD PROCEDURE DATE: 09/23/2023        Colonoscopy Indication: Asymptomatic Screening Colonoscopy and History Of Polyps    PCP:  Marjory Sneddon, MD     The cardiac exam revealed normal S1/S2 without S3 or murmurs.  The pulmonary exam revealed normal breath sounds.  The patient was deemed an ASA Class 2 .    The patient was informed of the risks, benefits and alternatives of the procedure, and his  questions were answered to his satisfaction. The patient elected to undergo a colonoscopy.      IV Medicines Given During the Procedure:       Versed: 5.0 mg  Fentanyl: 50 mcg  Zofran: N/A       A digital exam was performed.  There were no external anal lesions or palpable masses.    The Olympus adult colonoscope was advanced to the Cecum with mild difficulty. The cecum was identified by the presence of the ileocecal valve, the presence of the appendiceal orifice and transillumination of light in the right lower quadrant. The scope was withdrawn through the entire colon. The prep was good. A J-maneuver was performed in the rectum and there were moderate internal hemorrhoids.    Colonoscopy to the Cecum:  Without polyps.  No inflammatory changes.  Mild sigmoid diverticulosis without diverticulitis.    I recommend a follow-up colonoscopy colon in 7 years time. The patient was instructed to call for any new problems, i.e. diarrhea, constipation, lower abdominal cramps, hematochezia, or if the patient is hemoccult positive. In that case, a colonoscopy may need to be performed again.  I recommend No New Medications and a follow up office visit as needed.       Electronically signed by: Dionne Ano, MD

## 2023-09-27 ENCOUNTER — Other Ambulatory Visit: Payer: Self-pay

## 2023-09-27 ENCOUNTER — Ambulatory Visit
Admission: RE | Admit: 2023-09-27 | Discharge: 2023-09-27 | Disposition: A | Discharge status: Home / Self Care | Payer: Non-veteran care | Source: Ambulatory Visit | Attending: Otolaryngology | Admitting: Otolaryngology

## 2023-09-27 DIAGNOSIS — I6782 Cerebral ischemia: Secondary | ICD-10-CM | POA: Insufficient documentation

## 2023-09-27 DIAGNOSIS — J320 Chronic maxillary sinusitis: Secondary | ICD-10-CM | POA: Insufficient documentation

## 2023-09-27 DIAGNOSIS — H905 Unspecified sensorineural hearing loss: Secondary | ICD-10-CM | POA: Insufficient documentation

## 2023-09-27 MED ORDER — GADOPICLENOL 0.5 MMOL/ML (VUEWAY) IV SOLN *I*
0.1000 mL/kg | Freq: Once | INTRAVENOUS | Status: AC
Start: 2023-09-27 — End: 2023-09-27
  Administered 2023-09-27: 11.79 mL via INTRAVENOUS

## 2023-09-30 ENCOUNTER — Ambulatory Visit: Payer: No Typology Code available for payment source | Attending: Primary Care | Admitting: Family Medicine

## 2023-09-30 ENCOUNTER — Telehealth: Payer: Self-pay | Admitting: Primary Care

## 2023-09-30 ENCOUNTER — Encounter: Payer: Self-pay | Admitting: Family Medicine

## 2023-09-30 DIAGNOSIS — H906 Mixed conductive and sensorineural hearing loss, bilateral: Secondary | ICD-10-CM | POA: Insufficient documentation

## 2023-09-30 DIAGNOSIS — B9789 Other viral agents as the cause of diseases classified elsewhere: Secondary | ICD-10-CM

## 2023-09-30 DIAGNOSIS — J019 Acute sinusitis, unspecified: Secondary | ICD-10-CM

## 2023-09-30 NOTE — Patient Instructions (Addendum)
If you have any questions or concerns about today's visit please contact the [facility] Primary Care Digital Health Team at [phone]     If you have any questions or concerns about today's visit please contact the [facility] Primary Care Digital Health Team at [phone]       If symptoms become severe, such as shortness of breath, wheezing, chest pain, or any other rapidly worsening symptom patient should seek medical attention immediately.     It is best to treat and upper respiratory infection according to the symptoms.    Rest and fluids allows your own body's immune system to fight the infection which is the best medication.     For excessive runny nose: you can use over-the-counter nonsedating antihistamines such as Claritin (loratadine), Allegra (fexofenadine) or Zyrtec (cetirizine).  You may also use sedating antihistamines such as Benadryl (diphenhydramine) or Dramamine (dimenhydrinate).  If your nose is not running a lot and you should stop using these because they can thicken your nasal secretions and then increase the risk of sinus infection.     For sinus congestion: you may use over-the-counter nasal decongestant sprays such as Afrin or Neo-Synephrine or generic versions.  These should only be used for 3 days since they may cause rebound swelling in your nose and cause you to get addicted to them.  Addicted meaning when you stop using it your nose swells shut so you have to keep using it to open it up.     Sinus rinses or Neti Pot are ways to flush your nasal passages and sinuses with relatively large amounts of salt water solution which helps a lot to prevent colds going into sinus infections.     Flonase (fluticasone) or other steroid nasal sprays are also useful to prevent nasal congestion and sinus infections.  They are also good for allergies.  These are available over-the-counter although some are by prescription.     For coughing:  Unfortunately a recent analysis of studies showed that nothing is that  effective for cough due to the common cold.  However, dextromethorphan, honey and zinc may be the most effective.  You may take guaifenesin over-the-counter.  This is often combined with dextromethorphan.  This would be in Robitussin DM, Delsym, or Mucinex and many other brands.  Some people also responded well to albuterol which is an asthma inhaler or even steroids if they have a history of asthma or COPD (chronic obstructive pulmonary disease).  It is very normal to cough for a month or more after a cold.     For fever and body aches:   - You may take Take Tylenol (acetaminophen) 500 mg 2 tablets 3 times daily as needed.    - If this in not effective you may take NSAIDs/anti-inflammatory agents such as naproxen (Aleve) 220 mg tablets 1-2 tablets twice/day, OR ibuprofen (motrin/advil) 200 mg tablets 2-4 tablets 3-times/day.    - These medications have the risk of causing bleeding ulcers, kidney damage, raising blood pressure, and even increasing her risk of heart attack, stroke, and death.  Aleve (naproxen) is preferable because in studies it did not increase the risk of heart attack.  NSAIDs should not be used if you have chronic kidney disease or uncontrolled hypertension.  They should typically not be used more than 3 or 4 weeks although in some cases they are used for longer periods of time.     For sore throats: you can take Tylenol or NSAID'S as above and/or throat lozenges  or  Chloraseptic or many other brands and generics     I recommended against taking multisymptom cold and flu medications.  These medicines combine multiple types of medication such as pain relievers, antihistamines, decongestants, and cough medicines all into 1 pill or liquid.  The problem is most people are not sure exactly what they are getting when they take this.  It is better to treat your symptoms individually as above so you know what you are getting.               Patient Education   Patient Education     Sinusitis in adults    The Basics   Written by the doctors and editors at UpToDate   What is sinusitis? -- Sinusitis is a condition that can cause a stuffy nose, pain in the face, and discharge or "mucus" from the nose.  The sinuses are hollow areas in the bones of the face (figure 1). They have a thin lining that normally makes a small amount of mucus. When this lining gets irritated or infected, it swells and makes extra mucus. This causes symptoms.  Sinusitis usually happens after a person gets sick with a cold. The germs causing the cold can infect the sinuses, too. Sometimes, other germs can be the cause of the infection. Often, a person feels like their cold is getting better. But then, they get sinusitis and begin to feel sick again.  What are the symptoms of sinusitis? -- Common symptoms of sinusitis include:   Stuffy or blocked nose   Thick white, yellow, or green discharge from the nose   Pain in the teeth   Pain or pressure in the face - This often feels worse when a person bends forward.  People with sinusitis can also have other symptoms, such as:   Fever   Cough   Trouble smelling   Ear pressure or fullness   Headache   Bad breath   Feeling tired  Most of the time, symptoms start to improve in 7 to 10 days.  Should I see a doctor or nurse? -- See your doctor or nurse if your symptoms last more than 10 days, or if your symptoms first get better but then get worse.  Rarely, sinusitis can lead to serious problems. See your doctor or nurse right away (do not wait 10 days) if you have:   Fever higher than 102F (38.9C)   Sudden and severe pain in the face and head   Trouble seeing, or seeing double   Trouble thinking clearly   Swelling or redness around 1 or both eyes   Stiff neck  Is there anything I can do on my own to feel better? -- Yes. To help with your symptoms, you can:   Take an over-the-counter pain reliever to reduce the pain.   Rinse your nose and sinuses with salt water a few times a day - Ask your doctor or nurse  about the best way to do this.   Drink plenty of fluids - Staying hydrated might help to thin the mucus and make it drain more easily.  Your doctor might also recommend a steroid nose spray to reduce the swelling in your nose, especially if you have allergies. Talk to your doctor if you are thinking of using a steroid spray.  How is sinusitis treated? -- Most of the time, sinusitis does not need to be treated with antibiotic medicines. This is because most sinusitis is caused by viruses, not  bacteria, and antibiotics do not kill viruses. In fact, even sinusitis caused by bacteria will usually get better on its own without antibiotics.  Some people with sinusitis do need treatment with antibiotics. If your symptoms have not improved after 10 days, ask your doctor if you should take antibiotics. They might recommend that you wait 1 more week to see if your symptoms improve. But if you have symptoms such as a fever or a lot of pain, they might prescribe antibiotics. If you do get antibiotics, follow all of your doctor's instructions about taking them.  What if my symptoms do not get better? -- If your symptoms do not get better, talk with your doctor or nurse. They might order tests to figure out why you still have symptoms. These can include:   CT scan or other imaging tests - Imaging tests create pictures of the inside of the body.   A test to look inside the sinuses - For this test, a doctor puts a thin tube with a camera on the end into the nose and up into the sinuses.  Some people get a lot of sinus infections or have symptoms that last at least 3 months. These people can have a different type of sinusitis called "chronic sinusitis." Chronic sinusitis can be caused by different things. For example, some people have growths inside their nose or sinuses that are called "polyps." Other people have allergies that cause their symptoms.  Chronic sinusitis can be treated in different ways. If you have chronic sinusitis,  talk with your doctor about which treatments are right for you.  All topics are updated as new evidence becomes available and our peer review process is complete.  This topic retrieved from UpToDate on: Feb 27, 2023.  Topic 15535 Version 21.0  Release: 32.2.4 - C32.58   2024 UpToDate, Inc. and/or its affiliates. All rights reserved.  figure 1: Sinuses of the face     The sinuses are hollow areas in the bones of the face. This drawing shows where the sinuses are, from the side and front views. There are 4 pairs of sinuses, named for the bones around them: sphenoid, frontal, ethmoid, and maxillary.  Graphic [phone] Version 3.0  Consumer Information Use and Disclaimer   Disclaimer: This generalized information is a limited summary of diagnosis, treatment, and/or medication information. It is not meant to be comprehensive and should be used as a tool to help the user understand and/or assess potential diagnostic and treatment options. It does NOT include all information about conditions, treatments, medications, side effects, or risks that may apply to a specific patient. It is not intended to be medical advice or a substitute for the medical advice, diagnosis, or treatment of a health care provider based on the health care provider's examination and assessment of a patient's specific and unique circumstances. Patients must speak with a health care provider for complete information about their health, medical questions, and treatment options, including any risks or benefits regarding use of medications. This information does not endorse any treatments or medications as safe, effective, or approved for treating a specific patient. Up[employer]Date, Inc. and its affiliates disclaim any warranty or liability relating to this information or the use thereof.The use of this information is governed by the Terms of Use, available at https://www.wolterskluwer.com/en/know/clinical-effectiveness-terms. 2024 [employer]Date, Inc. and its  affiliates and/or licensors. All rights reserved.  Copyright    2024 Up[employer]Date, Inc. and/or its affiliates. All rights reserved.     Patient Education  Patient Education     How to Do a Nasal Rinse   Why is this procedure done?   A nasal rinse may also be called a sinus rinse. Some people will do a nasal rinse to help remove mucus, dirt, or allergens from their nose and sinuses. Others may use a nasal rinse to help them cope with sinus problems.     What will the results be?   The rinse may help remove and thin mucus in your nose and sinuses.  What happens before the procedure?   Wash your hands with warm, soapy water.  Make sure you have all of the items that you will need. Place them on a clean surface near your sink.  Nasal rinse solution  Bulb syringe, sinus bottle, or Neti pot  Tissues or clean cloth  Decide what kind of nasal rinse solution you will use.  It can be bought in a store or made at home.  If you make it at home, use distilled or sterilized water. Do not use tap water unless it has been boiled and cooled. Filtered tap water may also be used.  To make your nasal rinse use:  8 ounces (240 mL) water  1 teaspoon (5 grams) iodine free salt  1/4 teaspoon (1.25 grams) of baking soda  Wash your hands with warm, soapy water.   What happens during the procedure?   Fill the bulb syringe, sinus bottle, or Neti pot with 1/2 of the rinse solution.  Lean as far over the sink as possible with your head down and tilted to the side. Breathe through your mouth.  Put the tip of the syringe, bottle, or pot in your nose about 1/3 inch (0.8 cm). Squeeze about 4 ounces (120 mL) of fluid gently into your top nostril. Breathe normally through your mouth.  In a few seconds, fluid will start to come out of your other nostril.  Refill your syringe, bottle, or pot. Repeat process in your other nostril with your head tilted to the other side.  When the fluid has drained from your nose, blow your nose gently to remove any extra  fluids.  What happens after the procedure?   Wash your hands.  What follow-up care is needed?   Your doctor may ask you to make visits to the office to check on your progress. Be sure to keep these visits.  What problems could happen?   Mild itching  Burning  Stinging  Sneezing  Increase in drainage from the nose  Change in taste  When do I need to call the doctor?   You are not feeling better in 2 to 3 days or you are feeling worse  Helpful tips   Do not share nose drugs, syringes, or Neti pots with other people.  Do not use a nasal rinse if your nose feels very blocked.  Last Reviewed Date   2019-06-17  Consumer Information Use and Disclaimer   This generalized information is a limited summary of diagnosis, treatment, and/or medication information. It is not meant to be comprehensive and should be used as a tool to help the user understand and/or assess potential diagnostic and treatment options. It does NOT include all information about conditions, treatments, medications, side effects, or risks that may apply to a specific patient. It is not intended to be medical advice or a substitute for the medical advice, diagnosis, or treatment of a health care provider based on the health care provider's examination and assessment of  a patient's specific and unique circumstances. Patients must speak with a health care provider for complete information about their health, medical questions, and treatment options, including any risks or benefits regarding use of medications. This information does not endorse any treatments or medications as safe, effective, or approved for treating a specific patient. UpToDate, Inc. and its affiliates disclaim any warranty or liability relating to this information or the use thereof. The use of this information is governed by the Terms of Use, available at https://www.wolterskluwer.com/en/know/clinical-effectiveness-terms   Copyright   Copyright  2024 UpToDate, Inc. and its affiliates  and/or licensors. All rights reserved.

## 2023-09-30 NOTE — Telephone Encounter (Signed)
Patient called.  He wanted a prescription called in for Sinus congestion headache started on 9-27, no other symptoms has not tested for covid .    Informed patient he would need to be seen.  No appointments left with any provider  Advised patient to try on demand video appointment.  Patient agreed to try

## 2023-09-30 NOTE — Progress Notes (Signed)
TeleHome Video Encounter   Mode of Communication with Patient for This Visit    Mode of Communication with Patient for This Visit: Video  Patient Location: Home  Provider Location: Home       Visit date: 09/30/2023  Consent was obtained from the patient to complete this video visit; including the potential for financial liability.     Subjective   Noah Craig,[birth year], had concerns including Sinusitis.    sinusitis  - Sx started 9/27 (3 days ago)  - has not taken a covid test (last vaccine 2022)  - Since sx started, they have stayed about the same  - Associated sx include nasal congestion, rhinorrhea, feels warm/resolves in 10-15 minutes, SOB from the congestion, tired  - has not checked temp  - Patient denies chills, SOB  - At home patient has tried antiemetic yesterday when he was feeling nauseated (this resolved it), sudafed (with relief), hot showers (drains sinus but only temporarily)   - Pertinent medical hx positive for DM  - remote hx of smoking   - no recent sick contacts   - will be seeing ENT in a couple weeks for tubes in his ears (through [place]), denies hx of OM, 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      SUTAB [phone] MG TABS Take by mouth.      albuterol HFA (PROVENTIL, VENTOLIN) 108 (90 Base) MCG/ACT inhaler       glipiZIDE 10 mg tablet       insulin glargine (LANTUS SOLOSTAR) 100 unit/mL injection pen       Continuous Glucose Sensor (DEXCOM G7 SENSOR)  Apply sensor as directed. Change every 10 days. 3 each 5    semaglutide, 0.25 or 0.5 mg/dose, (OZEMPIC, 0.25 OR 0.5 MG/DOSE,) pen Inject 0.5 mg into the skin once a week. 3 mL 2    metFORMIN (GLUCOPHAGE-XR) 500 mg 24 hr tablet take 2 tablets by mouth every morning and 1 tablet every evening (Patient taking differently: take 1 tablets by mouth every morning and 2 tablet every evening) 270 tablet 1    JARDIANCE 25 MG tablet take 1 tablet by mouth every morning 90 tablet 1    losartan (COZAAR) 25 mg tablet take 1 tablet by mouth once daily 90 tablet 1    DROPLET PEN NEEDLES 31G X 6 MM use 1 PEN NEEDLE to inject MEDICATION subcutaneously three times a day as directed 200 each 1    insulin lispro (INSULIN LISPRO, 1 UNIT DIAL,) 100 UNIT/ML injection pen INJECT 10 units with breakfast, 15 units with lunch and 15 units with dinner.  DISCARD PEN 28 DAYS AFTER FIRST USE. MAXIMUM DAILY DOSE OF 40 UNITS 46 mL 5    Continuous Blood Gluc Receiver (DEXCOM G7 RECEIVER) DEVI By 1 each no specified route continuously 1 each 5    clindamycin (CLEOCIN T) 1 % lotion Apply topically 2  times daily  to the following areas: back of neck.  Use twice a day as needed for flares of folliculitis. 60 mL 0    sildenafil (REVATIO) 20 MG tablet 2-5 tab daily prn, taken 30-60 minutes before sexual activity 40 tablet 5    naproxen sodium (ANAPROX) 220 MG tablet Take 1 tablet (220 mg total) by mouth 2 times daily (with meals). Pt takes as needed       No current facility-administered medications on file prior to visit.     Immunization History   Administered Date(s) Administered    COVID-19 vector-nr rS-Ad26 vaccine(J[employer]sen) 0.5 mL 04/27/2020    Covid-19 mRNA vaccine (MODERNA) IM 100 mcg/0.5 mL 01/03/2021    H1N1 12/23/2008    Influenza Adult(9yr and up) 09/24/2011, 12/12/2012    Influenza Quad 0.54mL prefilled syringe/single dose vial (FluLaval,Fluzone,Afluria,Fluarix)Historical 10/30/2016, 11/28/2017, 09/24/2018, 10/21/2019    Influenza Split  (GSK) 10/24/2009    Influenza Whole 10/13/2007, 10/04/2008    Influenza multi-dose vial historical 10/15/2013, 12/03/2014    Pneumococcal Polysaccharide (Pneumovax) 12/23/2008    Tdap 10/03/2007    Zoster(Shingrix) 02/13/2020, 04/14/2020     Tobacco Use: Medium Risk (09/30/2023)    Patient History     Smoking Tobacco Use: Former     Smokeless Tobacco Use: Former     Passive Exposure: Not on file          Objective   Physical Exam  Constitutional:       Appearance: Normal appearance.   Pulmonary:      Effort: Pulmonary effort is normal. No respiratory distress.   Neurological:      Mental Status: He is alert and oriented to person, place, and time.   Psychiatric:         Mood and Affect: Mood normal.         Behavior: Behavior normal.         Thought Content: Thought content normal.         Judgment: Judgment normal.            Assessment    Acute viral sinusitis based on duration (3 days) and symptom presentation.  Patient is not a candidate for antibiotic treatment at this time.  That being said I do not have medical records from his ENT provider, so I did advise that it would be prudent to reach out to them to see if they have any further recommendations.        Plan   1. Acute viral sinusitis (Primary)  - recommended sx management with nasal saline, flonase, and afrin (with afrin precautions, no more than twice daily and no more than 3 days to avoid risk of rebound congestion)  - take covid test, if positive, would be candidate for antiviral treatment, advised to reach back out to [place] if that is the case  - advised to call his ENT to also let them know what is going on as he does have surgery scheduled with them in a couple weeks  - Multiple handouts provided in patient instructions for symptom management, saline flushes, etc  - return precautions reviewed           Follow up: Follow up if symptoms worsen or fail to improve.  Author:   Loreli Dollar, FNP    [facility] Primary Care Digital Health Team  Note  signed: 09/30/2023

## 2023-10-07 ENCOUNTER — Other Ambulatory Visit: Payer: Self-pay | Admitting: Primary Care

## 2023-10-07 NOTE — Telephone Encounter (Signed)
LOV 06/10/23  NOV 10/10/23  Labs 10/09/22    Last refill: 05/13/2023

## 2023-10-09 ENCOUNTER — Encounter: Payer: Self-pay | Admitting: Primary Care

## 2023-10-09 ENCOUNTER — Ambulatory Visit: Payer: No Typology Code available for payment source | Admitting: Primary Care

## 2023-10-10 ENCOUNTER — Ambulatory Visit: Payer: Non-veteran care | Attending: Primary Care | Admitting: Primary Care

## 2023-10-10 ENCOUNTER — Other Ambulatory Visit: Payer: Self-pay

## 2023-10-10 VITALS — BP 138/88 | HR 61 | Temp 97.2°F | Resp 14 | Ht 74.0 in | Wt 272.9 lb

## 2023-10-10 DIAGNOSIS — E11319 Type 2 diabetes mellitus with unspecified diabetic retinopathy without macular edema: Secondary | ICD-10-CM | POA: Insufficient documentation

## 2023-10-10 DIAGNOSIS — I10 Essential (primary) hypertension: Secondary | ICD-10-CM | POA: Insufficient documentation

## 2023-10-10 DIAGNOSIS — Z794 Long term (current) use of insulin: Secondary | ICD-10-CM | POA: Insufficient documentation

## 2023-10-10 DIAGNOSIS — Z01818 Encounter for other preprocedural examination: Secondary | ICD-10-CM | POA: Insufficient documentation

## 2023-10-10 MED ORDER — SEMAGLUTIDE (1 MG/DOSE) 4 MG/3ML SC SOPN *I*: 1.0000 mg | PEN_INJECTOR | SUBCUTANEOUS | 2 refills | Status: DC

## 2023-10-10 NOTE — Progress Notes (Signed)
[facility]  Outpatient Visit Note       Reason For Visit:   Chief Complaint   Patient presents with    Pre-op Exam     Surgery 10-23-23       Subjective:      Noah Craig is a 57 y.o. man with a history of HTN, HLD, DM2, presenting for pre-op visit in anticipation of bilateral myringotomy tubes.     Getting over recent sinus infection; on cefdinir for that for a planned 10 days (on day 3).  Tolerating well without upset stomach or diarrhea.  Covid tested twice and was negative both times.  MRI of the head did identify left maxillary sinusitis.     DM2:   His sugars are higher while he is ill, but otherwise is consistently under 180.  Taking lispro, semaglutide, metformin, jardiance.  No longer on glipizide or glargine.      HTN:  No chest pain or shortness of breath.  Taking losartan 25 mg daily.  No leg swelling or orthopnea.  Had a stress echo in 2022 without ischemic changes.  No history of stroke, ischemic heart disease or CHF.   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      Home Medications:     Prior to Admission medications    Medication Sig Start Date End Date Taking? Authorizing Provider   cefdinir (OMNICEF) 300 mg capsule Take 1 capsule (300 mg total) by mouth every 12 hours. 10/08/23  Yes [provider]   insulin lispro (HUMALOG KWIKPEN) 100 UNIT/ML injection pen inject 10 units with breakfast then 15 units with LUNCH and 15 units with dinner 10/07/23  Yes Marjory Sneddon, MD   albuterol HFA (PROVENTIL, VENTOLIN) 108 (90 Base) MCG/ACT inhaler    Yes [provider]   glipiZIDE 10 mg tablet    Yes [provider]   insulin glargine (LANTUS SOLOSTAR) 100 unit/mL injection pen    Yes [provider]   ondansetron 4 mg tablet Take by mouth. 09/09/23  Yes [provider]   SUTAB [phone] MG TABS Take by mouth. 09/09/23  Yes [provider]   Continuous Glucose Sensor (DEXCOM G7 SENSOR) Apply sensor  as directed. Change every 10 days. 08/05/23  Yes Marjory Sneddon, MD   semaglutide, 0.25 or 0.5 mg/dose, (OZEMPIC, 0.25 OR 0.5 MG/DOSE,) pen Inject 0.5 mg into the skin once a week. 08/05/23  Yes Marjory Sneddon, MD   metFORMIN (GLUCOPHAGE-XR) 500 mg 24 hr tablet take 2 tablets by mouth every morning and 1 tablet every evening  Patient taking differently: take 1 tablets by mouth every morning and 2 tablet every evening 06/03/23  Yes Marjory Sneddon, MD   JARDIANCE 25 MG tablet take 1 tablet by mouth every morning 05/21/23  Yes Marjory Sneddon, MD   losartan (COZAAR) 25 mg tablet take 1 tablet by mouth once daily 05/21/23  Yes Marjory Sneddon, MD   DROPLET PEN NEEDLES 31G X 6 MM use 1 PEN NEEDLE to inject MEDICATION subcutaneously three times a day as directed 05/08/23  Yes Mellody Memos, MD   Continuous Blood Gluc Receiver (DEXCOM G7 RECEIVER) DEVI By 1 each no specified route continuously 02/19/22  Yes Darliss Ridgel, PA   clindamycin (CLEOCIN T) 1 % lotion Apply topically 2 times daily  to the following areas: back of neck.  Use twice a day as needed for flares of folliculitis. 10/19/20  Yes Marjory Sneddon, MD  sildenafil (REVATIO) 20 MG tablet 2-5 tab daily prn, taken 30-60 minutes before sexual activity 10/01/19  Yes Guadalupe Maple, MD   naproxen sodium (ANAPROX) 220 MG tablet Take 1 tablet (220 mg total) by mouth 2 times daily (with meals). Pt takes as needed   Yes [provider]       Allergies:     Allergies   Allergen Reactions    Lisinopril Cough    Victoza [Liraglutide] Other (See Comments)     GI upset    Environmental Allergies Other (See Comments)     Sneezing       Review of Systems:     As detailed above by problem.     Physical Exam:  Temp Readings from Last 3 Encounters:   10/10/23 36.2 C (97.2 F) (Temporal)   09/23/23 36.3 C (97.3 F) (Temporal)   06/10/23 36.4 C (97.6 F) (Temporal)     BP Readings from Last 3 Encounters:   10/10/23 138/88   09/23/23 116/66   06/10/23  162/88     Pulse Readings from Last 3 Encounters:   10/10/23 61   09/23/23 73   06/10/23 63         Vitals:    10/10/23 1201   BP: 138/88   BP Location: Left arm   Patient Position: Sitting   Cuff Size: large adult   Pulse: 61   Resp: 14   Temp: 36.2 C (97.2 F)   TempSrc: Temporal   SpO2: 100%   Weight: 123.8 kg (272 lb 14.4 oz)   Height: 1.88 m (6\' 2" )     Wt Readings from Last 3 Encounters:   10/10/23 123.8 kg (272 lb 14.4 oz)   09/23/23 117.9 kg (260 lb)   09/24/23 117.9 kg (260 lb)     General: Well-developed male, in no acute distress.  Lungs: Breathing comfortably at rest.  Lungs CTAB without adventitious sounds.  Cardiovascular: RRR no m/r/g.  Extremities warm and dry to touch.  No carotid bruits.   Extremities: No edema, no rashes.  Neurologic: Fully interactive and cooperative with exam.  Able to provide an accurate history.  Speech and mood appropriate.      ASSESSMENT and Plan:     57 y.o. male with a history of HTN, HLD, DM2, presenting for pre-op visit in anticipation of bilateral myringotomy tubes.  Plan by problem below.    1. Preop examination   - Chronic conditions are under fair control with current therapies, and no signs/symptoms of active cardiopulmonary disease.  Chales Abrahams risk is estimated at 0.1%; RCRI risk is estimated at 3.9%. He is cleared to proceed from my standpoint.  Did advise stopping naproxen one week prior to the procedure.     2. Essential hypertension   - BP is acceptable today.  Continue losartan 25 mg daily.    3. Type 2 diabetes mellitus with retinopathy, with long-term current use of insulin, macular edema presence unspecified, unspecified laterality, unspecified retinopathy severity   - Increase semaglutide to 1 mg per week to help reduce insulin need.  Continue lispro with meals, along with metformin and jardiance at current doses.      Marjory Sneddon, MD 10/10/2023 12:06 PM

## 2023-10-17 ENCOUNTER — Encounter: Payer: Self-pay | Admitting: Otolaryngology

## 2023-10-18 ENCOUNTER — Encounter: Payer: Self-pay | Admitting: Otolaryngology

## 2023-10-23 ENCOUNTER — Ambulatory Visit: Admission: RE | Admit: 2023-10-23 | Payer: Non-veteran care | Source: Ambulatory Visit | Admitting: Otolaryngology

## 2023-10-23 ENCOUNTER — Encounter: Admission: RE | Payer: Self-pay | Source: Ambulatory Visit

## 2023-10-23 SURGERY — INSERTION, TYMPANOSTOMY TUBE, BILATERAL
Anesthesia: Moderate Sedation | Laterality: Bilateral

## 2023-11-20 ENCOUNTER — Other Ambulatory Visit: Payer: Self-pay | Admitting: Primary Care

## 2023-11-20 NOTE — Telephone Encounter (Signed)
Last seen 10-10-23 and next apt 12-13-23. Lipids, CMP, A1c 12/23. All ordered with urine microalbumin.

## 2023-11-21 ENCOUNTER — Other Ambulatory Visit: Payer: Self-pay | Admitting: Primary Care

## 2023-11-21 NOTE — Telephone Encounter (Signed)
Last office visit:   10/10/2023  Last telehome visit:   Visit date not found  Patients upcoming appointments:  Future Appointments   Date Time Provider Department Center   12/13/2023  3:00 PM Marjory Sneddon, MD [facility] None     BP Readings from Last 3 Encounters:   10/10/23 138/88   09/23/23 116/66   06/10/23 162/88      Recent Lab results:  GENERAL CHEMISTRY   Recent Labs     08/30/23  1307 11/30/22  1417   NA 140 141   K 4.1 4.5   CL  --  102   CO2  --  27   GAP  --  12   UN 18 17   CREAT 1.11 1.17   GLU  --  184*   CA  --  9.7      LIPID PROFILE   Recent Labs     11/30/22  1417   CHOL 112   TRIG 122   HDL 44   LDLC 44      LIVER PROFILE   Recent Labs     11/30/22  1417   ALT 45   AST 28   ALK 105   TB 0.6      DIABETES THYROID   Recent Labs     11/30/22  1417   HA1C 6.9*    No value within the past 365 days      Pending/Orders Labs:  Lab Frequency Next Occurrence   PSA (eff.03-2009) Once 11/28/2023   Lipid Panel (Reflex to Direct  LDL if Triglycerides more than 400) Once 06/04/2023   Hemoglobin A1c Once 06/04/2023   CBC Once 06/04/2023   Comprehensive metabolic panel Once 06/04/2023   Vitamin D Once 06/04/2023   Lipid Panel (Reflex to Direct  LDL if Triglycerides more than 400) Once 12/10/2023   Hemoglobin A1c Once 12/10/2023   CBC Once 12/10/2023   Comprehensive metabolic panel Once 12/10/2023   TSH Once 12/10/2023   Vitamin D Once 12/10/2023   Microalbumin, Urine, Random Once 12/10/2023

## 2023-12-09 ENCOUNTER — Other Ambulatory Visit: Payer: Self-pay | Admitting: Primary Care

## 2023-12-09 DIAGNOSIS — E119 Type 2 diabetes mellitus without complications: Secondary | ICD-10-CM

## 2023-12-09 MED ORDER — DROPLET PEN NEEDLES 31G X 6 MM MISC
1 refills | Status: DC
Start: 2023-12-09 — End: 2024-06-19

## 2023-12-09 NOTE — Telephone Encounter (Signed)
Last seen 10-10-23, sched 12-13-23  Labs 11-30-22, has future labs

## 2023-12-11 ENCOUNTER — Other Ambulatory Visit
Admission: RE | Admit: 2023-12-11 | Discharge: 2023-12-11 | Disposition: A | Discharge status: Home / Self Care | Payer: No Typology Code available for payment source | Source: Ambulatory Visit | Attending: Primary Care | Admitting: Primary Care

## 2023-12-11 DIAGNOSIS — Z794 Long term (current) use of insulin: Secondary | ICD-10-CM | POA: Insufficient documentation

## 2023-12-11 DIAGNOSIS — E119 Type 2 diabetes mellitus without complications: Secondary | ICD-10-CM | POA: Insufficient documentation

## 2023-12-11 DIAGNOSIS — Z125 Encounter for screening for malignant neoplasm of prostate: Secondary | ICD-10-CM | POA: Insufficient documentation

## 2023-12-11 DIAGNOSIS — E11319 Type 2 diabetes mellitus with unspecified diabetic retinopathy without macular edema: Secondary | ICD-10-CM | POA: Insufficient documentation

## 2023-12-11 LAB — TSH: TSH: 2.34 u[IU]/mL (ref 0.27–4.20)

## 2023-12-11 LAB — LIPID PANEL
Chol/HDL Ratio: 2.4
Cholesterol: 118 mg/dL
HDL: 49 mg/dL (ref 40–60)
LDL Calculated: 60 mg/dL
Non HDL Cholesterol: 69 mg/dL
Triglycerides: 46 mg/dL

## 2023-12-11 LAB — COMPREHENSIVE METABOLIC PANEL
ALT: 34 U/L (ref 0–50)
AST: 29 U/L (ref 0–50)
Albumin: 4.6 g/dL (ref 3.5–5.2)
Alk Phos: 89 U/L (ref 40–130)
Anion Gap: 12 (ref 7–16)
Bilirubin,Total: 0.8 mg/dL (ref 0.0–1.2)
CO2: 26 mmol/L (ref 20–28)
Calcium: 9.9 mg/dL (ref 8.6–10.2)
Chloride: 103 mmol/L (ref 96–108)
Creatinine: 1.14 mg/dL (ref 0.67–1.17)
Glucose: 85 mg/dL (ref 60–99)
Lab: 14 mg/dL (ref 6–20)
Potassium: 4.1 mmol/L (ref 3.3–5.1)
Sodium: 141 mmol/L (ref 133–145)
Total Protein: 7.4 g/dL (ref 6.3–7.7)
eGFR BY CREAT: 75 *

## 2023-12-11 LAB — MICROALBUMIN, URINE, RANDOM
Creatinine,UR: 157 mg/dL (ref 20–300)
Microalb/Creat Ratio: 215.7 mg MA/g CR — ABNORMAL HIGH (ref 0.0–29.9)
Microalbumin,UR: 33.87 mg/dL

## 2023-12-11 LAB — CBC
Hematocrit: 47 % (ref 37–52)
Hemoglobin: 16.2 g/dL (ref 12.0–17.0)
MCV: 87 fL (ref 75–100)
Platelets: 195 10*3/uL (ref 150–450)
RBC: 5.4 MIL/uL (ref 4.0–6.0)
RDW: 13.3 % (ref 0.0–15.0)
WBC: 8.9 10*3/uL (ref 3.5–11.0)

## 2023-12-11 LAB — VITAMIN D: 25-OH Vit Total: 30 ng/mL (ref 30–60)

## 2023-12-11 LAB — PSA (EFF.4-2010): PSA (eff. 4-2010): 1.29 ng/mL (ref 0.00–4.00)

## 2023-12-12 LAB — HEMOGLOBIN A1C: Hemoglobin A1C: 6.5 % — ABNORMAL HIGH

## 2023-12-13 ENCOUNTER — Ambulatory Visit: Payer: No Typology Code available for payment source | Admitting: Primary Care

## 2023-12-31 ENCOUNTER — Other Ambulatory Visit: Payer: Self-pay | Admitting: Primary Care

## 2023-12-31 MED ORDER — SEMAGLUTIDE (1 MG/DOSE) 4 MG/3ML SC SOPN *I*
1.0000 mg | PEN_INJECTOR | SUBCUTANEOUS | 2 refills | Status: DC
Start: 2023-12-31 — End: 2024-04-12

## 2023-12-31 NOTE — Telephone Encounter (Signed)
Last office visit:   10/10/2023  Last telehome visit:   Visit date not found  Patients upcoming appointments:  No future appointments.  BP Readings from Last 3 Encounters:   10/10/23 138/88   09/23/23 116/66   06/10/23 162/88      Recent Lab results:  GENERAL CHEMISTRY   Recent Labs     12/11/23  1414 08/30/23  1307   NA 141 140   K 4.1 4.1   CL 103  --    CO2 26  --    GAP 12  --    UN 14 18   CREAT 1.14 1.11   GLU 85  --    CA 9.9  --       LIPID PROFILE   Recent Labs     12/11/23  1414   CHOL 118   TRIG 46   HDL 49   LDLC 60      LIVER PROFILE   Recent Labs     12/11/23  1414   ALT 34   AST 29   ALK 89   TB 0.8      DIABETES THYROID   Recent Labs     12/11/23  1414   HA1C 6.5*    Recent Labs     12/11/23  1414   TSH 2.34         Pending/Orders Labs:  Lab Frequency Next Occurrence

## 2024-01-22 LAB — HM DIABETES EYE EXAM

## 2024-02-04 ENCOUNTER — Encounter: Payer: Self-pay | Admitting: Primary Care

## 2024-02-12 ENCOUNTER — Other Ambulatory Visit: Payer: Self-pay | Admitting: Primary Care

## 2024-02-12 ENCOUNTER — Encounter: Payer: Self-pay | Admitting: Otolaryngology

## 2024-02-12 DIAGNOSIS — E119 Type 2 diabetes mellitus without complications: Secondary | ICD-10-CM

## 2024-02-12 NOTE — Telephone Encounter (Signed)
LOV 10/10/23  NOV no upcoming appt.  Labs 12/11/23    Last ordered: 6 months ago (08/05/2023

## 2024-02-15 ENCOUNTER — Other Ambulatory Visit: Payer: Self-pay

## 2024-02-19 NOTE — Preop H&P (Signed)
Reason for Visit  Ear follow up    Assessment & Plan  Former patient of Dr. 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 Reported bilateral tinnitus, decreased hearing, and blocked ear sensation, which have been going on for years. No history of ear tube placement in the past. Reviewed previous audiogram dated August/22/2024, which indicated mixed hearing loss with negative pressure, bilaterally. The ear exam today showed severely retracted TMs with erosion of the incus, right worse than left and clear fluid level behind the TMs. Given the overall picture, we agree that he is candidate for bilateral ear tube placement.  chronic otitis media  dysfunction of bilateral eustachian tubes  retraction of tympanic membrane  severe, bilateral  sensation of blocked ear  bilateral tinnitus    Patient Instructions  -Follow up as scheduled. Call, message via patient portal, or return to clinic for any questions, concerns, or problems.    Discussion Notes  PE tubes (BMT) Treatment options other than tympanostomy tube placement were reviewed. Risks and benefits were discussed which included but were not limited to the following: 1. Ear tubes are an effective surgical method to reduce incidence of ear infections, restore hearing loss caused by fluid in the middle ear and may prevent progressive ear drum damage caused by decreased air pressure in the middle ear. 2. The most common problem after ear tube surgery is persistent or recurrent ear infection which usually manifests as ear drainage. The infection can usually be controlled with antibiotic drops. 3. Rarely the opening in the ear drum may not close after the tube extrudes and surgical repair is necessary in roughly 1% of cases. 4. Problems with anesthesia, if used, are possible but extremely uncommon with this procedure. 5. There are other unexpected events that can occur with tubes, but all are very  rare. 6. Post- op pain is minimal and usually easily controlled with acetaminophen or ibuprofen. 7. Hearing loss will usually improve within a day, often immediately. Failure to improve may indicate a secondary problem in the ear. 8. Tubes usually stay in position 6 - 12 months or 3- 5 years, depending on several factors including the type of tube and condition of the eardrum. Early extrusion is possible and may or may not result in need to replace the extruded tube. 9. If the tube remains in place for an extended period of time and the patient is doing well, removal with or without anesthesia may be recommended to reduce the risk of the hole not closing completely on its own. The patient's questions regarding ear tubes were discussed in detail and their concerns addressed. The patient provided informed consent and procedure will be scheduled in the near future.    History of Present Illness  58 year old male, returns to the office for an ear check up. Patient reports decreased hearing and tinnitus for the past few years.     Review of Systems  ROS as noted in the HPI    Screening  None recorded    Physical Exam  Comprehensive LENT Exam- ADULT    Constitutional  General Appearance: well-nourished, well-developed, well hydrated, alert, in no acute distress, well groomed, gait WNL, active and alert  Communication Ability/ Voice Quality: communication ability normal for age, voice quality normal    Head  Inspection: normocephalic, no lesions present, atraumatic  Palpation: no tenderness on palpation, no masses on palpation    Face  Inspection  normal appearance, no lesions present  Facial strength: facial motion symmetric, normal eye closure strength bilaterally    Skin  Color And Pigmentation normal coloration    Ears  Hearing hearing to conversational voice normal  External Ear auricle appearance normal bilaterally, no auricle lesions, external auditory canal appearance normal  Middle Ear no perforations, retracted  (severely retracted tympanic membranes with erosion of the incus, right worse than left) (clear fluid level behind tympanic membrane, bilaterally)    Nose/Nasopharynx  External Nose: appearance normal    Oral Cavity/Pharynx  Lips upper and lower lips normal    Neck  Inspection and Palpation Lymph Nodes appearance normal and symmetric, trachea midline    Neurological/Psychiatric  Orientation: oriented to person    Chest/Respiratory  Respiratory Effort breathing unlabored    Procedure Documentation  None recorded    PERSONAL INFO  Past Medical History:   Diagnosis Date    Arthritis     knees, hands    Colon polyp     Complication of anesthesia     slow to awaken    Diabetes mellitus     Hypertension      Past Surgical History:   Procedure Laterality Date    COLONOSCOPY      JOINT REPLACEMENT      KNEE SURGERY Bilateral     reconstruction; several surgeries    TONSILLECTOMY       Social History[1]    Allergies  liraglutide  Lisinopril    Problems  None recorded    Medications  Dexcom G7 Receiver  Dexcom G7 Sensor  Droplet Pen Needle  ibuprofen  insulin lispro  Jardiance  losartan  metformin  naproxen  ondansetron HCl  Ozempic       [1]   Social History  Tobacco Use    Smoking status: Former     Types: Cigarettes     Quit date: 1992     Years since quitting: 33.1    Smokeless tobacco: Former     Quit date: 01/01/1992   Substance Use Topics    Alcohol use: Yes     Comment:  monthly    Drug use: No

## 2024-02-20 ENCOUNTER — Encounter: Payer: Self-pay | Admitting: Otolaryngology

## 2024-02-20 NOTE — Invasive Procedure Plan of Care (Signed)
CONSENT FOR MEDICAL  OR SURGICAL PROCEDURE                            Patient Name: Noah Craig  [facility] [address]                                                              DOB: 21-Mar-1966         Please read this form or have someone read it to you.   It's important to understand all parts of this form. If something isn't clear, ask [place] to explain.   When you sign it, that means you understand the form and give [place] permission to do this surgery or procedure.     I agree for Lorraine Lax, MD , and None along with any assistants* they may choose, to treat the following condition(s): Ear infections, ear fluid, pain, and/or hearing loss   By doing this surgery or procedure on me: Ear Tubes   This is also known as: Bilateral Insertion of Tympanostomy Tubes   Laterality: Bilateral     *if you'd like a list of the assistants, please ask. We can give that to you.    1. The care provider has explained my condition to me. They have told me how the procedure can help me. They have told me about other ways of treating my condition. I understand the care provider cannot guarantee the result of the procedure. If I don't have this procedure, my other choices are: Observation and medications    2. The care provider has told me the risks (problems that can happen) of the procedure. I understand there may be unwanted results. The risks that are related to this procedure include: BMT Treatment options other than tympanostomy tube placement were reviewed. Risks and benefits were discussed which included but were not limited to the following: 1. Ear tubes are an effective surgical method to reduce incidence of ear infections, restore hearing loss caused by fluid in the middle ear and may prevent progressive ear drum damage caused by decreased air pressure in the middle ear. 2. The most common problem after ear tube surgery is persistent or recurrent ear infection which usually manifests as ear drainage. The infection can usually be  controlled with antibiotics. 3. Rarely the opening in the ear drum may not close after the tube extrudes and surgical repair is necessary. 4. Problems with the anesthetic are possible but extremely uncommon with this surgery. 5. There are other unexpected events that can occur with the surgery, but all are very rare. 6. Post- op pain is minimal and usually easily controlled with acetaminophen or ibuprofen. 7. Hearing loss will usually improve within a day, often immediately. Failure to improve may indicate a second problem in the ear. 8. Tubes usually stay in position 6 - 12 months, depending on several factors including the type of tube and condition of the patients ear drum. Early extrusion is possible and may or may not result in need to replace the extruded tube. 9. If the tube remains in place for an extended period of time and the patient is doing well, removal under anesthesia may be recommended to reduce the risk of the hole not closing completely on its  own.    3. I understand that during the procedure, my care provider may find a condition that we didn't know about before the treatment started. Therefore, I agree that my care provider can perform any other treatment which they think is necessary and available.    4. I give permission to the hospital and/or its departments to examine and keep tissue, blood, body parts, fluids or materials removed from my body during the procedure(s) to aid in diagnosis and treatment, after which they may be used for scientific research or teaching by appropriate persons. If these materials are used for science or teaching, my identity will be protected. I will no longer own or have any rights to these materials regardless of how they may be used.    5. My care provider might want a representative from a medical device company to be there during my procedure. I understand that person works for:          The ways they might help my care provider during my procedure include:             6. Here are my decisions about receiving blood, blood products, or tissues. I understand my decisions cover the time before, during and after my procedure, my treatment, and my time in the hospital. After my procedure, if my condition changes a lot, my care provider will talk with me again about receiving blood or blood products. At that time, my care provider might need me to review and sign another consent form, about getting or refusing blood.    I understand that the blood is from the community blood supply. Volunteers donated the blood, the volunteers were screened for health problems. The blood was examined with very sensitive and accurate tests to look for hepatitis, HIV/AIDS, and other diseases. Before I receive blood, it is tested again to make sure it is the correct type.    My chances of getting a sickness from blood products are small. But no transfusion is 100% safe. I understand that my care provider feels the good I will receive from the blood is greater than the chances of something going wrong. My care provider has answered my questions about blood products.      My decision  about blood or  blood products   Not applicable.        My decision   about tissue  Implants     Not applicable.          I understand this  form.    My care provider  or his/her  assistants have  explained:   What I am having done and why I need it.  What other choices I can make instead of having this done.  The benefits and possible risks (problems) to me of having this done.  The benefits and possible risks (problems) to me of receiving transplants, blood, or blood products.  There is no guarantee of the results.  The care provider may not stay with me the entire time that I am in the operating or procedure room.  My provider has explained how this may affect my procedure. My provider has answered my  questions about this.         I give my  permission for  this surgery or  procedure.             _______________________________________________  My signature  (or parent or other person authorized to sign for you, if you are unable to sign for  yourself or if you are under 17 years old)        ______           Date        _____        Time   Electronic Signatures will display at the bottom of the consent form.    Care provider's statement: I have discussed the planned procedure, including the possibility for transfusion of blood  products or receipt of tissue as necessary; expected benefits; the possible complications and risks; and possible alternatives  and their benefits and risks with the patients or the patient's surrogate. In my opinion, the patient or the patient's surrogate  understands the proposed procedure, its risks, benefits and alternatives.              Electronically signed by: Lorraine Lax, MD                                                02/20/2024         Date        7:30 PM        Time

## 2024-02-20 NOTE — Anesthesia Preprocedure Evaluation (Addendum)
 Anesthesia Pre-operative History and Physical for [facility]  .  .  .  Anesthesia Evaluation Information Source: records, patient     ANESTHESIA HISTORY    + history of anesthetic complications          delayed emergence    GENERAL    + Obesity          central     PULMONARY  Pertinent(-):  No smoking, asthma or recent URI    CARDIOVASCULAR    + Hypertension  Pertinent(-):  No angina    GI/HEPATIC/RENAL   NPO: > 8hrs ago (solids) and > 2hrs ago (clears)    Pertinent(-):  No GERD      ENDO/OTHER    + Diabetes Mellitus          Type 2  Pertinent(-):  No thyroid disease         Physical Exam    Airway            Mallampati: III            Neck ROM: full  Dental   Normal Exam   Cardiovascular           Rhythm: regular           Rate: normal         Pulmonary     breath sounds clear to auscultation           ________________________________________________________________________  PLAN  ASA Score  2  Anesthetic Plan general       Induction (routine IV) General Anesthesia/Sedation Maintenance Plan (IV bolus and inhaled agents); Airway (mask); Line ( use current access); Monitoring (standard ASA); Positioning (lateral decubitus and supine); PONV Plan (dexamethasone and ondansetron); Pain (per surgical team); PostOp (ASC or PACU)Standard Attestation  Informed Consent     Risks:          Risks discussed were commensurate with the plan listed above with the following specific points: N/V, aspiration and sore throat, Damage to: eyes and teeth, unexpected serious injury and awareness.    Anesthetic Consent:         Anesthetic plan (and risks as noted above) were discussed with patient    Responsible Anesthesia Provider Attestation:  I attest that the patient or proxy understands and accepts the risks and benefits of the anesthesia plan. I also attest that I have personally performed a pre-anesthetic examination and evaluation, and prescribed the anesthetic plan for this particular location within 48 hours prior to the  anesthetic as documented. Ulyess Blossom, MD  02/21/24, 2:12 PM

## 2024-02-20 NOTE — Discharge Instructions (Signed)
Ear Tubes Post-operative Care Instructions      Name: Noah Craig  Date: 02/20/2024    Expectations:  - Standard Pope (or similar grommet) tubes typically last about 6-18 months, T-tubes or U-tubes last 3-5 years.   - Most tubes fall out on their own. If they do not come out of their own, they may need to be removed by your doctor.   - Drainage from the ears is common after surgery. You will be started on ear drops by your doctor. The drops will be given to you before you leave the hospital on the day of surgery.  - If there is fluid in your ears prior to surgery, everything will be loud after the tubes have been placed. Such a shift in volume can be startling to your child; keep the radio/TV volume low immediately after surgery.   - Plan to see your doctor back every 4-6 months. They will assess the tubes to ensure that they are still in place and functioning.     Activity:  - Children may return to school or daycare the day after surgery.  - Physical education and sports can be resumed the day after surgery.    Water Precautions:  - Ear plugs are NOT necessary for swimming or bathing after tubes unless diving below the surface of the water. Water will not typically go through the tube during swimming but may when diving below the surface so if diving the use of ear plugs is recommended.   - Ear plugs may also be necessary for the following situations:    - Ear pain or discomfort with swimming.   - Drainage from the ear canal.   - Swimming deeper than 6 feet under water.   - Lake, ocean, or pond swimming.   - There are a variety of soft, fitted ear plugs available. Silicone, moldable plugs are very effective, e.g. Mack's ear plugs. Neoprene headbands can also be used to cover the ears while swimming.   - Avoid bubble baths or soapy water as this will decrease the surface tension of the water.     Medications:  - You may resume your preoperative medications unless otherwise specified by your doctor.  -  Acetaminophen or Ibuprofen are effective for pain control after surgery.  - Ear drops: Ofloxin (or similar rx) 3-4 drops twice daily for 5 days  - You will be scheduled for a follow-up 3-4 weeks after surgery. While the tubes are in place, you will be seen every 4-6 months.    Ear Infections with Tubes:  - You may still get ear infections with tubes in place. When an infection occurs, the ear may drain and smell badly.  - Drainage from the ear is an indicator that the tube is working. Allowing the infection to drain from the ear is a good thing!  - Drainage can be clear, cloudy, or bloody.  - Ear drops are the most effective way to treat infections when there are tube in place. Typically ofloxacin or ciprofloxacin/dexamethasone drops are used twice daily for about 10 days. Lie on your side with the affected ear facing up. Apply the drops to the ear and then pump the skin (tragus) over the ear canal several times to push the drops into the ear.  - Prior to applying the drops, clean the ear. Rid the ear canal of as much drainage as possible with a thin washcloth or cotton ball. Absorb as much of the drainage as possible. You  can try using an infant nasal aspirator (blue bulb aspirator or Nose Laqueta Jean) to gently suction the drainage.  - When there is an active infection with drainage, keep the ear dry. Use a cotton ball coated with Vaseline to cover the opening of the ear canal when bathing. Do not submerge your head underwater with an active infection.  - Oral antibiotics are not typically needed for ear infections when the tubes are in place and functioning properly. Occasionally they may bee needed if you are very ill or the ear drops have not been effective.      Call [facility] at [phone] if you develop any of the following symptoms:  - Persistent drainage over a week that is not responding to drops.  - Fever >101.61F.  - Poorly controlled pain.

## 2024-02-21 ENCOUNTER — Ambulatory Visit: Payer: Non-veteran care | Admitting: Anesthesiology

## 2024-02-21 ENCOUNTER — Other Ambulatory Visit: Payer: Self-pay

## 2024-02-21 ENCOUNTER — Encounter: Payer: Self-pay | Admitting: Otolaryngology

## 2024-02-21 ENCOUNTER — Encounter
Admission: RE | Disposition: A | Discharge status: Home / Self Care | Payer: Self-pay | Source: Ambulatory Visit | Attending: Otolaryngology

## 2024-02-21 ENCOUNTER — Ambulatory Visit
Admission: RE | Admit: 2024-02-21 | Discharge: 2024-02-21 | Disposition: A | Discharge status: Home / Self Care | Payer: Non-veteran care | Source: Ambulatory Visit | Attending: Otolaryngology | Admitting: Otolaryngology

## 2024-02-21 DIAGNOSIS — I10 Essential (primary) hypertension: Secondary | ICD-10-CM | POA: Insufficient documentation

## 2024-02-21 DIAGNOSIS — E119 Type 2 diabetes mellitus without complications: Secondary | ICD-10-CM | POA: Insufficient documentation

## 2024-02-21 DIAGNOSIS — E669 Obesity, unspecified: Secondary | ICD-10-CM | POA: Insufficient documentation

## 2024-02-21 DIAGNOSIS — H6693 Otitis media, unspecified, bilateral: Secondary | ICD-10-CM | POA: Insufficient documentation

## 2024-02-21 DIAGNOSIS — Z87891 Personal history of nicotine dependence: Secondary | ICD-10-CM | POA: Insufficient documentation

## 2024-02-21 DIAGNOSIS — Z6833 Body mass index (BMI) 33.0-33.9, adult: Secondary | ICD-10-CM | POA: Insufficient documentation

## 2024-02-21 HISTORY — PX: PR TYMPANOSTOMY GENERAL ANESTHESIA: 69436

## 2024-02-21 LAB — POCT GLUCOSE: Glucose POCT: 254 mg/dL — ABNORMAL HIGH (ref 60–99)

## 2024-02-21 SURGERY — INSERTION, TYMPANOSTOMY TUBE, BILATERAL
Anesthesia: General | Laterality: Bilateral | Wound class: Clean Contaminated

## 2024-02-21 MED ORDER — OFLOXACIN 0.3 % OP SOLN *I*
OPHTHALMIC | Status: AC
Start: 2024-02-21 — End: 2024-02-21
  Filled 2024-02-21: qty 5

## 2024-02-21 MED ORDER — PROPOFOL 10 MG/ML IV EMUL (INTERMITTENT DOSING) WRAPPED *I*
INTRAVENOUS | Status: AC
Start: 2024-02-21 — End: 2024-02-21
  Filled 2024-02-21: qty 20

## 2024-02-21 MED ORDER — MIDAZOLAM HCL 1 MG/ML IJ SOLN *I* WRAPPED
INTRAMUSCULAR | Status: AC
Start: 2024-02-21 — End: 2024-02-21
  Filled 2024-02-21: qty 2

## 2024-02-21 MED ORDER — DEXAMETHASONE SOD PHOSPHATE PF 10 MG/ML IJ SOLN *I*
INTRAMUSCULAR | Status: AC
Start: 2024-02-21 — End: 2024-02-21
  Filled 2024-02-21: qty 1

## 2024-02-21 MED ORDER — MORPHINE SULFATE 2 MG/ML IV SOLN *WRAPPED*
2.0000 mg | Status: DC | PRN
Start: 2024-02-21 — End: 2024-02-21

## 2024-02-21 MED ORDER — OXYCODONE HCL 5 MG PO TABS *I*
5.0000 mg | ORAL_TABLET | Freq: Four times a day (QID) | ORAL | Status: DC | PRN
Start: 2024-02-21 — End: 2024-02-21

## 2024-02-21 MED ORDER — ONDANSETRON HCL 2 MG/ML IV SOLN *I*
4.0000 mg | Freq: Once | INTRAMUSCULAR | Status: DC | PRN
Start: 2024-02-21 — End: 2024-02-21

## 2024-02-21 MED ORDER — MIDAZOLAM HCL 1 MG/ML IJ SOLN *I* WRAPPED
INTRAMUSCULAR | Status: DC | PRN
Start: 2024-02-21 — End: 2024-02-21
  Administered 2024-02-21: 2 mg via INTRAVENOUS

## 2024-02-21 MED ORDER — HYDROMORPHONE HCL PF 1 MG/ML IJ SOLN *WRAPPED*
0.5000 mg | INTRAMUSCULAR | Status: DC | PRN
Start: 2024-02-21 — End: 2024-02-21

## 2024-02-21 MED ORDER — ACETAMINOPHEN 325 MG PO TABS *I*
650.0000 mg | ORAL_TABLET | Freq: Four times a day (QID) | ORAL | Status: DC | PRN
Start: 2024-02-21 — End: 2024-02-21
  Administered 2024-02-21: 650 mg via ORAL

## 2024-02-21 MED ORDER — ONDANSETRON HCL 2 MG/ML IV SOLN *I*
INTRAMUSCULAR | Status: DC | PRN
Start: 2024-02-21 — End: 2024-02-21
  Administered 2024-02-21: 4 mg via INTRAVENOUS

## 2024-02-21 MED ORDER — ACETAMINOPHEN 325 MG PO TABS *I*
ORAL_TABLET | ORAL | Status: AC
Start: 2024-02-21 — End: 2024-02-21
  Filled 2024-02-21: qty 2

## 2024-02-21 MED ORDER — IBUPROFEN 400 MG PO TABS *I*
400.0000 mg | ORAL_TABLET | Freq: Four times a day (QID) | ORAL | Status: DC | PRN
Start: 2024-02-21 — End: 2024-02-21

## 2024-02-21 MED ORDER — PROPOFOL 10 MG/ML IV EMUL (INTERMITTENT DOSING) WRAPPED *I*
INTRAVENOUS | Status: DC | PRN
Start: 2024-02-21 — End: 2024-02-21
  Administered 2024-02-21: 150 mg via INTRAVENOUS

## 2024-02-21 MED ORDER — METOCLOPRAMIDE HCL 5 MG/ML IJ SOLN *I*
10.0000 mg | Freq: Once | INTRAMUSCULAR | Status: DC | PRN
Start: 2024-02-21 — End: 2024-02-21

## 2024-02-21 MED ORDER — OXYCODONE HCL 5 MG PO TABS *I*
10.0000 mg | ORAL_TABLET | Freq: Four times a day (QID) | ORAL | Status: DC | PRN
Start: 2024-02-21 — End: 2024-02-21

## 2024-02-21 MED ORDER — OFLOXACIN 0.3 % OP SOLN *I*
OPHTHALMIC | Status: DC | PRN
Start: 2024-02-21 — End: 2024-02-21
  Administered 2024-02-21: 2 [drp] via TOPICAL

## 2024-02-21 MED ORDER — KETOROLAC TROMETHAMINE 15 MG/ML IJ SOLN *I*
15.0000 mg | Freq: Three times a day (TID) | INTRAMUSCULAR | Status: DC | PRN
Start: 2024-02-21 — End: 2024-02-21

## 2024-02-21 MED ORDER — LACTATED RINGERS IV SOLN *I*
125.0000 mL/h | INTRAVENOUS | Status: DC
Start: 2024-02-21 — End: 2024-02-21

## 2024-02-21 MED ORDER — LIDOCAINE HCL 2 % IJ SOLN *I*
INTRAMUSCULAR | Status: DC | PRN
Start: 2024-02-21 — End: 2024-02-21
  Administered 2024-02-21: 80 mg via INTRAVENOUS

## 2024-02-21 MED ORDER — ONDANSETRON HCL 2 MG/ML IV SOLN *I*
4.0000 mg | Freq: Four times a day (QID) | INTRAMUSCULAR | Status: DC | PRN
Start: 2024-02-21 — End: 2024-02-21

## 2024-02-21 MED ORDER — LIDOCAINE HCL 2 % (PF) IJ SOLN *I*
INTRAMUSCULAR | Status: AC
Start: 2024-02-21 — End: 2024-02-21
  Filled 2024-02-21: qty 5

## 2024-02-21 MED ORDER — ONDANSETRON HCL 2 MG/ML IV SOLN *I*
INTRAMUSCULAR | Status: AC
Start: 2024-02-21 — End: 2024-02-21
  Filled 2024-02-21: qty 2

## 2024-02-21 MED ORDER — LIDOCAINE HCL 1 % IJ SOLN *I*
0.1000 mL | INTRAMUSCULAR | Status: DC | PRN
Start: 2024-02-21 — End: 2024-02-21

## 2024-02-21 MED ORDER — DEXAMETHASONE SODIUM PHOSPHATE 4 MG/ML INJ SOLN *WRAPPED*
INTRAMUSCULAR | Status: DC | PRN
Start: 2024-02-21 — End: 2024-02-21
  Administered 2024-02-21: 10 mg via INTRAVENOUS

## 2024-02-21 MED ORDER — ACETAMINOPHEN 650 MG RE SUPP *I*
650.0000 mg | Freq: Four times a day (QID) | RECTAL | Status: DC | PRN
Start: 2024-02-21 — End: 2024-02-21

## 2024-02-21 SURGICAL SUPPLY — 4 items
BLADE MYRINGOTOMY SPEAR 45D (Supply) ×1 IMPLANT
GLOVE SURG BIOGEL PI ULTRATOUCH SZ 7.5 (Glove) ×2 IMPLANT
TUBE T LENGTH 6.0 SILICONE (Implant) ×2 IMPLANT
TUBING N-COND STR STER CLR 6MM 12FT (Tubing) ×1 IMPLANT

## 2024-02-21 NOTE — Anesthesia Postprocedure Evaluation (Signed)
 Anesthesia Post-Op Note    Patient: Noah Craig    Procedure(s) Performed:  Procedure Summary  Date:  02/21/2024 Anesthesia Start: 02/21/2024 11:07 AM Anesthesia Stop: 02/21/2024 11:22 AM Room / Location:  [id number] MAIN OR   Procedure(s):  INSERTION, TYMPANOSTOMY TUBE, BILATERAL Diagnosis:  bilateral chronic otitis media Surgeon(s):  Lorraine Lax, MD Responsible Anesthesia Provider:  Ulyess Blossom, MD         Recovery Vitals  BP: 160/85 (02/21/2024 12:23 PM)  Heart Rate: 74 (02/21/2024 12:23 PM)  Heart Rate (via Pulse Ox): 71 (02/21/2024 11:30 AM)  Resp: 16 (02/21/2024 12:23 PM)  Temp: 36.2 C (97.2 F) (02/21/2024 12:23 PM)  SpO2: 97 % (02/21/2024 12:23 PM)  O2 Flow Rate: 2 L/min (02/21/2024 11:25 AM)   0-10 Scale: 2 (02/21/2024 12:23 PM)    Anesthesia type:  general  Complications Noted During Procedure or in PACU:  None   Comment:    Patient Location:  [facility]  Level of Consciousness:    Recovered to baseline  Patient Participation:     Able to participate  Temperature Status:    Normothermic  Oxygen Saturation:    Within patient's normal range  Cardiac Status:   within patient's normal range  Fluid Status:    Stable  Airway Patency:     Yes  Pulmonary Status:    Baseline  Pain Management:    Adequate analgesia  Nausea and Vomiting:  None    Post Op Assessment:    Tolerated procedure well  Responsible Anesthesia Provider Attestation:  All indicated post anesthesia care provided       -

## 2024-02-21 NOTE — Anesthesia Case Conclusion (Signed)
 CASE CONCLUSION  Emergence  Criteria Used for Airway Removal:  Adequate Tv & RR and acceptable O2 saturation  Assessment:  Routine  Transport  Directly to: PACU  Airway:  Nasal cannula  Oxygen Delivery:  4 lpm  Position:  Recumbent  Patient Condition on Handoff  Level of Consciousness:  Moderately sedated  Patient Condition:  Stable  Handoff Report to:  RN

## 2024-02-21 NOTE — Anesthesia Procedure Notes (Signed)
---------------------------------------------------------------------------------------------------------------------------------------    AIRWAY   GENERAL INFORMATION AND STAFF    Patient location during procedure: OR       Date of Procedure: 02/21/2024 1:05 PM  CONDITION PRIOR TO MANIPULATION     Current Airway/Neck Condition:  Normal        For more airway physical exam details, see Anesthesia PreOp Evaluation  AIRWAY METHOD     Patient Position:  Sniffing    Preoxygenated: yes      Induction: IV    Mask Difficulty Assessment:  2 - vent by mask + OPA/NPA    Number of Attempts at Approach:  1  FINAL AIRWAY DETAILS    Final Airway Type:  Mask  ----------------------------------------------------------------------------------------------------------------------------------------

## 2024-02-21 NOTE — Op Note (Signed)
 Operative Note (Surgical Case/Log ID: [id number])       Date of Surgery: 02/21/2024       Surgeons: Surgeons and Role:     Lorraine Lax, MD - Primary   Assistants:         Pre-op Diagnosis: Pre-Op Diagnosis Codes:      * Bilateral chronic otitis media [H66.93]       Post-op Diagnosis: Post-Op Diagnosis Codes:     * Bilateral chronic otitis media [H66.93]       Procedure(s) Performed: Procedure(s) (LRB):  INSERTION, TYMPANOSTOMY TUBE, BILATERAL (Bilateral)       Anesthesia Type: General        Fluid Totals: I/O this shift:  02/21 0700 - 02/21 1459  In: 300 (2.5 mL/kg) [I.V.:300]  Out: 0 (0 mL/kg)   Net: 300  Weight: 119.3 kg        Estimated Blood Loss: 0 mL       Specimens to Pathology:  * No specimens in log *       Temporary Implants:        Packing:                 Patient Condition: good       Indications: 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 The patient was maintained and positioned on the stretcher for microscope ear procedures in the usual fashion. Adequate MAC/LMA anesthesia was induced. A surgical timeout was called and the procedures verified. The right ear was examined first. If debris/cerumen was present it was removed. A radial incision was made in the anterior inferior aspect. No fluid was suctioned from the middle ear. A T tube was then inserted and appropriately positioned.  On the left side no effusion was found and suctioned and tube inserted in a similar fashion. Several drops of antibiotic ear solution were instilled on each side followed by cotton balls. The patient was awakened and taken to the recovery area in satisfactory condition. There were no apparent complications.       Signed:  Lorraine Lax, MD  on  02/21/2024 at 11:28 AM

## 2024-02-25 ENCOUNTER — Encounter: Payer: Self-pay | Admitting: Otolaryngology

## 2024-04-03 ENCOUNTER — Encounter: Payer: Self-pay | Admitting: Gastroenterology

## 2024-04-05 ENCOUNTER — Other Ambulatory Visit: Payer: Self-pay | Admitting: Primary Care

## 2024-04-05 DIAGNOSIS — E119 Type 2 diabetes mellitus without complications: Secondary | ICD-10-CM

## 2024-04-05 MED ORDER — DEXCOM G7 SENSOR MISC *A*
5 refills | Status: DC
Start: 2024-04-05 — End: 2024-05-12

## 2024-04-05 MED ORDER — METFORMIN HCL 500 MG PO TB24 *I*
ORAL_TABLET | ORAL | 1 refills | Status: DC
Start: 2024-04-05 — End: 2024-05-12

## 2024-04-12 ENCOUNTER — Other Ambulatory Visit: Payer: Self-pay | Admitting: Internal Medicine

## 2024-04-13 MED ORDER — SEMAGLUTIDE (1 MG/DOSE) 4 MG/3ML SC SOPN *I*
1.0000 mg | PEN_INJECTOR | SUBCUTANEOUS | 2 refills | Status: DC
Start: 2024-04-13 — End: 2024-05-12

## 2024-04-13 NOTE — Telephone Encounter (Signed)
 Last office visit:   10/10/2023  Last telehome visit:   Visit date not found  Patients upcoming appointments:  No future appointments.  BP Readings from Last 3 Encounters:   02/21/24 160/85   10/10/23 138/88   09/23/23 116/66      Recent Lab results:  GENERAL CHEMISTRY   Recent Labs     12/11/23  1414 08/30/23  1307   NA 141 140   K 4.1 4.1   CL 103  --    CO2 26  --    GAP 12  --    UN 14 18   CREAT 1.14 1.11   GLU 85  --    CA 9.9  --       LIPID PROFILE   Recent Labs     12/11/23  1414   CHOL 118   TRIG 46   HDL 49   LDLC 60      LIVER PROFILE   Recent Labs     12/11/23  1414   ALT 34   AST 29   ALK 89   TB 0.8      DIABETES THYROID   Recent Labs     12/11/23  1414   HA1C 6.5*    Recent Labs     12/11/23  1414   TSH 2.34         Pending/Orders Labs:  Lab Frequency Next Occurrence

## 2024-05-05 ENCOUNTER — Telehealth: Payer: Self-pay | Admitting: Primary Care

## 2024-05-05 ENCOUNTER — Encounter: Payer: Self-pay | Admitting: Primary Care

## 2024-05-05 ENCOUNTER — Other Ambulatory Visit: Payer: Self-pay

## 2024-05-05 ENCOUNTER — Ambulatory Visit: Attending: Primary Care | Admitting: Primary Care

## 2024-05-05 VITALS — BP 158/82 | HR 75 | Temp 98.6°F | Resp 16 | Ht 74.0 in | Wt 263.0 lb

## 2024-05-05 DIAGNOSIS — Z20822 Contact with and (suspected) exposure to covid-19: Secondary | ICD-10-CM | POA: Insufficient documentation

## 2024-05-05 DIAGNOSIS — J988 Other specified respiratory disorders: Secondary | ICD-10-CM | POA: Insufficient documentation

## 2024-05-05 DIAGNOSIS — Z1152 Encounter for screening for COVID-19: Secondary | ICD-10-CM | POA: Insufficient documentation

## 2024-05-05 DIAGNOSIS — Z20828 Contact with and (suspected) exposure to other viral communicable diseases: Secondary | ICD-10-CM | POA: Insufficient documentation

## 2024-05-05 MED ORDER — AMOXICILLIN-POT CLAVULANATE 875-125 MG PO TABS *I*
1.0000 | ORAL_TABLET | Freq: Two times a day (BID) | ORAL | 0 refills | Status: AC
Start: 2024-05-05 — End: 2024-05-12

## 2024-05-05 NOTE — Progress Notes (Signed)
 [facility]  Outpatient Visit Note       Reason For Visit:   Chief Complaint   Patient presents with    Cough    Head Injury     Hit in left ear with baseball       Subjective:      Noah Craig is a 58 y.o. man with a history of HTN, HLD, DM2 with retinopathy, presenting for an acute visit for the following.      Respiratory tract infection:   Cough, headaches, shortness of breath, feeling blistering hot, chills.  Has had diarrhea earlier this morning.  Appetite is also decreased.  About two weeks ago he had very similar symptoms but those all resolved until his return of symptoms this past week.  The cough is productive.  Throat is irritated from coughing but wouldn't say that it's sore.      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  He was fine for another five innings and was able to drive home.  No vomiting.      Home Medications:     Prior to Admission medications    Medication Sig Start Date End Date Taking? Authorizing Provider   semaglutide , 1 mg/dose, (OZEMPIC ) 4 MG/3ML pen Inject 1 mg into the skin once a week. 04/13/24  Yes Whelen, Sean M, NP   metFORMIN  (GLUCOPHAGE -XR) 500 mg 24 hr tablet take 1 tablets by mouth every morning and 2 tablet every evening 04/05/24  Yes Michaele Cough, MD   Continuous Glucose Sensor (DEXCOM G7 SENSOR) use as directed , CHANGE EVERY 10 DAYS 04/05/24  Yes Michaele Cough, MD   insulin  pen needle (DROPLET PEN NEEDLES) 31G X 6 MM use 1 PEN NEEDLE to inject MEDICATION subcutaneously three times a day as directed 12/09/23  Yes Michaele Cough, MD   JARDIANCE  25 MG tablet take 1 tablet by mouth every morning 11/20/23  Yes Michaele Cough, MD   losartan  (COZAAR ) 25 mg tablet take 1 tablet by mouth once daily 11/20/23  Yes Michaele Cough, MD   cefdinir (OMNICEF) 300 mg capsule Take 1 capsule (300 mg total) by mouth every 12 hours. 10/08/23  Yes [provider]   insulin  lispro (HUMALOG  KWIKPEN) 100 UNIT/ML injection  pen inject 10 units with breakfast then 15 units with LUNCH and 15 units with dinner 10/07/23  Yes Michaele Cough, MD   albuterol  HFA (PROVENTIL , VENTOLIN ) 108 (90 Base) MCG/ACT inhaler    Yes [provider]   ondansetron  4 mg tablet Take by mouth. 09/09/23  Yes [provider]   Continuous Blood Gluc Receiver (DEXCOM G7 RECEIVER) DEVI By 1 each no specified route continuously 02/19/22  Yes Mickie Leita HERO, PA   clindamycin  (CLEOCIN  T) 1 % lotion Apply topically 2 times daily  to the following areas: back of neck.  Use twice a day as needed for flares of folliculitis. 10/19/20  Yes Michaele Cough, MD   sildenafil  (REVATIO ) 20 MG tablet 2-5 tab daily prn, taken 30-60 minutes before sexual activity 10/01/19  Yes Rankin Dunnings, MD   naproxen sodium (ANAPROX) 220 MG tablet Take 1 tablet (220 mg total) by mouth 2 times daily (with meals). Pt takes as needed   Yes [provider]       Allergies:   Allergies[1]  Review of Systems:     As detailed above by problem.     Physical Exam:  Temp Readings from Last 3 Encounters:  05/05/24 37 C (98.6 F) (Temporal)   02/21/24 36.2 C (97.2 F) (Temporal)   10/10/23 36.2 C (97.2 F) (Temporal)     BP Readings from Last 3 Encounters:   05/05/24 158/82   02/21/24 160/85   10/10/23 138/88     Pulse Readings from Last 3 Encounters:   05/05/24 75   02/21/24 74   10/10/23 61         Vitals:    05/05/24 1350   BP: 158/82   BP Location: Right arm   Patient Position: Sitting   Cuff Size: large adult   Pulse: 75   Resp: 16   Temp: 37 C (98.6 F)   TempSrc: Temporal   SpO2: 97%   Weight: 119.3 kg (263 lb)   Height: 1.88 m (6' 2)     Wt Readings from Last 3 Encounters:   05/05/24 119.3 kg (263 lb)   02/21/24 119.3 kg (263 lb)   10/10/23 123.8 kg (272 lb 14.4 oz)     General: Well-developed male, in no acute distress.  HEENT: Sclerae anicteric.  No ecchymoses over the external left ear.  Oropharynx moist, pink, without exudates.  No adenopathy.  Lungs:  Breathing comfortably at rest.  Lungs with scattered rhonchi over the left upper lung fields; otherwise clear, no wheezing.    Cardiovascular: RRR no m/r/g.  Extremities warm and dry to touch.   Extremities: No edema, no rashes.  Neurologic: Fully interactive and cooperative with exam.  Able to provide an accurate history.  Speech and mood appropriate.      ASSESSMENT and Plan:     58 y.o. male with a history of HTN, HLD, DM2 with retinopathy, presenting for an acute visit for the following.  Plan by problem below.    1. Respiratory tract infection   - Tested for covid today.  Reviewed that it that comes back negative, I've gone ahead and sent in a script for augmentin  to take twice a day for one week for suspected bacterial lower respiratory tract infection (fevers, productive cough and focal lung findings).  If covid comes back positive then would treat symptomatically since he is already at the 5-day mark with his symptoms.  Gave the ok to try imodium or pepto-bismol for diarrhea if needed.       Donnice Judge, MD 05/05/2024 1:56 PM           [1]   Allergies  Allergen Reactions    Lisinopril  Cough    Victoza  [Liraglutide ] Other (See Comments)     GI upset    Environmental Allergies Other (See Comments)     Sneezing

## 2024-05-05 NOTE — Telephone Encounter (Signed)
 General    Symptoms: patient has been experiencing a cough, headaches, diarrhea, feeling hot but didn't take temp.       When did symptoms start?05/01/24    Did you try anything to lessen your symptoms?  Yes  If yes What?  When?  Did it help?  Cough syrup, tylenol  with some relief.

## 2024-05-07 ENCOUNTER — Telehealth: Payer: Self-pay | Admitting: Primary Care

## 2024-05-07 ENCOUNTER — Ambulatory Visit: Payer: Self-pay | Admitting: Primary Care

## 2024-05-07 LAB — COVID-19 NAAT (PCR): COVID-19 NAAT (PCR): NEGATIVE

## 2024-05-07 NOTE — Telephone Encounter (Signed)
 Spoke with patient.  Per Dr. Florestine Hurl, informed patient of the message below:    "Please let patient know his covid test came back normal. Go ahead and take the antibiotic as prescribed. "    Patient verbalized understanding.

## 2024-05-12 ENCOUNTER — Other Ambulatory Visit: Payer: Self-pay | Admitting: Primary Care

## 2024-05-12 ENCOUNTER — Other Ambulatory Visit: Payer: Self-pay

## 2024-05-12 ENCOUNTER — Encounter: Payer: Self-pay | Admitting: Ambulatory Care

## 2024-05-12 DIAGNOSIS — E119 Type 2 diabetes mellitus without complications: Secondary | ICD-10-CM

## 2024-05-13 MED ORDER — SEMAGLUTIDE (1 MG/DOSE) 4 MG/3ML SC SOPN *I*
1.0000 mg | PEN_INJECTOR | SUBCUTANEOUS | 2 refills | Status: DC
Start: 2024-05-13 — End: 2024-07-23

## 2024-05-13 MED ORDER — SEMAGLUTIDE (1 MG/DOSE) 4 MG/3ML SC SOPN *I*
1.0000 mg | PEN_INJECTOR | SUBCUTANEOUS | 2 refills | Status: DC
Start: 2024-05-13 — End: 2024-05-13

## 2024-05-13 MED ORDER — DEXCOM G7 SENSOR MISC *A*
5 refills | Status: DC
Start: 2024-05-13 — End: 2024-06-15

## 2024-05-13 MED ORDER — HUMALOG KWIKPEN 100 UNIT/ML SC SOPN
PEN_INJECTOR | SUBCUTANEOUS | 1 refills | Status: DC
Start: 2024-05-13 — End: 2024-10-02

## 2024-05-13 MED ORDER — METFORMIN HCL 500 MG PO TB24 *I*
ORAL_TABLET | ORAL | 1 refills | Status: DC
Start: 2024-05-13 — End: 2024-06-15

## 2024-05-13 NOTE — Telephone Encounter (Signed)
 Last office visit:   05/05/2024  Last telehome visit:   Visit date not found  Patients upcoming appointments:  No future appointments.  BP Readings from Last 3 Encounters:   05/05/24 158/82   02/21/24 160/85   10/10/23 138/88      Recent Lab results:  GENERAL CHEMISTRY   Recent Labs     12/11/23  1414 08/30/23  1307   NA 141 140   K 4.1 4.1   CL 103  --    CO2 26  --    GAP 12  --    UN 14 18   CREAT 1.14 1.11   GLU 85  --    CA 9.9  --       LIPID PROFILE   Recent Labs     12/11/23  1414   CHOL 118   TRIG 46   HDL 49   LDLC 60      LIVER PROFILE   Recent Labs     12/11/23  1414   ALT 34   AST 29   ALK 89   TB 0.8      DIABETES THYROID    Recent Labs     12/11/23  1414   HA1C 6.5*    Recent Labs     12/11/23  1414   TSH 2.34         Pending/Orders Labs:  Lab Frequency Next Occurrence

## 2024-05-13 NOTE — Telephone Encounter (Signed)
 Please resend to [employer].

## 2024-05-13 NOTE — Telephone Encounter (Signed)
 LOV 10/10/23  NOV no upcoming appt.  Labs 12/11/23    Last ordered: 1 month ago (04/13/2024

## 2024-05-13 NOTE — Addendum Note (Signed)
 Addended by: Hollis Lurie on: 05/13/2024 01:47 PM     Modules accepted: Orders

## 2024-05-22 ENCOUNTER — Ambulatory Visit

## 2024-05-22 ENCOUNTER — Other Ambulatory Visit: Payer: Self-pay

## 2024-05-22 VITALS — BP 158/76 | HR 102 | Temp 96.9°F | Ht 73.0 in | Wt 260.6 lb

## 2024-05-22 DIAGNOSIS — R051 Acute cough: Secondary | ICD-10-CM | POA: Insufficient documentation

## 2024-05-22 DIAGNOSIS — I10 Essential (primary) hypertension: Secondary | ICD-10-CM | POA: Insufficient documentation

## 2024-05-22 DIAGNOSIS — Z20822 Contact with and (suspected) exposure to covid-19: Secondary | ICD-10-CM | POA: Insufficient documentation

## 2024-05-22 DIAGNOSIS — Z87891 Personal history of nicotine dependence: Secondary | ICD-10-CM | POA: Insufficient documentation

## 2024-05-22 DIAGNOSIS — Z20828 Contact with and (suspected) exposure to other viral communicable diseases: Secondary | ICD-10-CM | POA: Insufficient documentation

## 2024-05-22 DIAGNOSIS — Z1152 Encounter for screening for COVID-19: Secondary | ICD-10-CM | POA: Insufficient documentation

## 2024-05-22 MED ORDER — BENZONATATE 100 MG PO CAPS *I*
100.0000 mg | ORAL_CAPSULE | Freq: Three times a day (TID) | ORAL | 0 refills | Status: DC | PRN
Start: 2024-05-22 — End: 2024-09-18

## 2024-05-22 NOTE — Progress Notes (Signed)
 [facility]  Progress Note  Chief Complaint   Patient presents with    URI    Cough     Took safe tussin for cough    Headache     Took advil , helped some    Pharyngitis    Shortness of Breath    Muscle Pain    Fatigue       Noah Craig is a 58 y.o. male who presents today for an acute visit.     Patient presents with CC of URI symptoms.    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 This morning he reports waking up and coughing up dark brown/black phlegm with associated symptoms of headache, sore throat, muscle fatigue, SOB with exertion, nasal congestion, and chills. He reports this is the 3rd URI episode he has experienced within a month- 1st episode lasted 4 days, 2nd episode lasted 2.5 weeks. He was seen in office on 05/05/2024 with negative COVID test, prescribed a course of Augmentin  x7 days which he completed with resolution of symptoms. Denies fever, N/V, diarrhea, constipation. When asked about chest pain, he reports centralized chest pain that radiates to LT arm- believes this is d/t cough. He reports BGs have been well-controlled, current reading 147. Had bilateral tympanostomy tubes placed on 02/20/2023. Of note, reports smoking 1 PPD x10 years when he was in [employer], quit in 1992.           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    ALLERGIES:  Lisinopril , Victoza  [liraglutide ], and Environmental allergies   Allergies reviewed with patient and confirmed  today.    Review of Systems   Constitutional:  Positive for chills and malaise/fatigue. Negative for fever.   HENT:  Positive for congestion, hearing loss (chronic, wears hearing aids) and sore throat. Negative for ear discharge and ear pain.    Respiratory:  Positive for cough, sputum production and shortness of breath (with exertion).    Cardiovascular:  Positive for chest pain. Negative for leg swelling.   Gastrointestinal:  Negative for abdominal pain, constipation, diarrhea, nausea and vomiting.   Genitourinary: Negative.    Musculoskeletal:  Positive for myalgias (generalized body aches).   Neurological:  Positive for headaches. Negative for dizziness and weakness.        Metabolic Profile CBC   Lab Results   Component Value Date    NA 141 12/11/2023    K 4.1 12/11/2023    CL 103 12/11/2023    CO2 26 12/11/2023    UN 14 12/11/2023    CREAT 1.14 12/11/2023    CREAT 1.06 07/20/2014    GLU 85 12/11/2023    Lab Results   Component Value Date    WBC 8.9 12/11/2023    HGB 16.2 12/11/2023    HCT 47 12/11/2023    PLT 195 12/11/2023      LIPIDS DIABETES   Lab Results   Component Value Date  CHOL 118 12/11/2023    TRIG 46 12/11/2023    HDL 49 12/11/2023    LDLC 60 12/11/2023    LDLC 44 11/30/2022    LDLC 67 11/27/2021    CHHDC 2.4 12/11/2023    Lab Results   Component Value Date    GLU 85 12/11/2023    MACR 215.7 (H) 12/11/2023     Hemoglobin A1C   Date Value Ref Range Status   12/11/2023 6.5 (H) % Final     Comment:     Ref Range <=5.6  HbA1c values of 5.7-6.4% indicate an increased risk for developing  diabetes mellitus.  HbA1c values greater than or equal to 6.5% are diagnostic of  diabetes mellitus.  For diagnosis of diabetes in individuals without unequivocal  hyperglycemia, results should be confirmed by repeat testing.     11/30/2022 6.9 (H) % Final     Comment:     Ref Range <=5.6  HbA1c values of 5.7-6.4% indicate an increased risk for developing  diabetes mellitus.  HbA1c values greater than or equal to 6.5%  are diagnostic of  diabetes mellitus.  For diagnosis of diabetes in individuals without unequivocal  hyperglycemia, results should be confirmed by repeat testing.     10/09/2022 6.8 (H) % Final     Comment:     Ref Range <=5.6  HbA1c values of 5.7-6.4% indicate an increased risk for developing  diabetes mellitus.  HbA1c values greater than or equal to 6.5% are diagnostic of  diabetes mellitus.  For diagnosis of diabetes in individuals without unequivocal  hyperglycemia, results should be confirmed by repeat testing.     11/27/2021 6.3 (H) % Final     Comment:     Ref Range <=5.6  HbA1c values of 5.7-6.4% indicate an increased risk for developing  diabetes mellitus.  HbA1c values greater than or equal to 6.5% are diagnostic of  diabetes mellitus.  For diagnosis of diabetes in individuals without unequivocal  hyperglycemia, results should be confirmed by repeat testing.     05/26/2021 6.2 (H) % Final     Comment:     Ref Range <=5.6  HbA1c values of 5.7-6.4% indicate an increased risk for developing  diabetes mellitus.  HbA1c values greater than or equal to 6.5% are diagnostic of  diabetes mellitus.  For diagnosis of diabetes in individuals without unequivocal  hyperglycemia, results should be confirmed by repeat testing.          LIVER MISC   Lab Results   Component Value Date    AST 29 12/11/2023    ALT 34 12/11/2023    CK 55 10/02/2019    Lab Results   Component Value Date    TSH 2.34 12/11/2023    VID25 30 12/11/2023    PSAR3 1.29 12/11/2023        EXAM:  Vitals:    05/22/24 1524   BP: 158/76   BP Location: Left arm   Patient Position: Sitting   Cuff Size: large adult   Pulse: 102   Temp: 36.1 C (96.9 F)   TempSrc: Temporal   SpO2: 97%   Weight: 118.2 kg (260 lb 9.6 oz)   Height: 1.854 m (6\' 1" )                                 Body mass index is 34.38 kg/m.  BP Readings from Last 4 Encounters:   05/22/24 158/76  05/05/24 158/82   02/21/24 160/85   10/10/23 138/88      Wt Readings from Last 4 Encounters:    05/22/24 118.2 kg (260 lb 9.6 oz)   05/05/24 119.3 kg (263 lb)   02/21/24 119.3 kg (263 lb)   10/10/23 123.8 kg (272 lb 14.4 oz)         Physical Exam  Constitutional:       General: He is not in acute distress.     Appearance: Normal appearance.   HENT:      Head: Normocephalic and atraumatic.      Right Ear: Decreased hearing (bilateral hearing aids) noted.      Left Ear: Decreased hearing noted.      Mouth/Throat:      Mouth: Mucous membranes are moist.      Pharynx: Oropharynx is clear. No oropharyngeal exudate or posterior oropharyngeal erythema.   Eyes:      Conjunctiva/sclera: Conjunctivae normal.   Cardiovascular:      Rate and Rhythm: Normal rate and regular rhythm.      Pulses: Normal pulses.      Heart sounds: Normal heart sounds. No murmur heard.  Pulmonary:      Effort: Pulmonary effort is normal. No respiratory distress.      Breath sounds: Normal breath sounds. No wheezing, rhonchi or rales.   Abdominal:      General: Bowel sounds are normal.   Musculoskeletal:         General: Normal range of motion.      Cervical back: Normal range of motion and neck supple.      Right lower leg: No edema.      Left lower leg: No edema.   Skin:     General: Skin is warm and dry.   Neurological:      Mental Status: He is alert and oriented to person, place, and time.   Psychiatric:         Mood and Affect: Mood normal.         Behavior: Behavior normal.         Thought Content: Thought content normal.          ASSESSMENT/PLAN:  1. Acute cough (Primary)  Lungs clear to auscultation; no wheezes, rales, rhonchi. COVID swab collected today d/t acute onset of cough, follow-up pending results. As this is patient's 3rd recurrence of URI symptoms in 1 month, will order blood work for further evaluation. Patient has a hx of smoking (1 PPD x10 years, quit in 1992) and reports coughing up dark phlegm this AM- will obtain chest CT.  - CBC and differential; Future  - Comprehensive metabolic panel; Future  - benzonatate  (TESSALON) 100 mg capsule; Take 1 capsule (100 mg total) by mouth 3 times daily as needed.  Swallow whole. Do not crush or chew.  Dispense: 30 capsule; Refill: 0  - COVID-19 NAAT (PCR); Future  - Blood culture; Future  - Blood culture; Future  - CT chest without contrast; Future    2. History of smoking for 6-10 years  - CT chest without contrast; Future     3. Essential hypertension  BP elevated today 158/76, 158/82 during previous visit on 05/05/2024. Patient reports experiencing mild centralized chest discomfort that radiates into LT arm- discussed obtaining EKG today which he declines as he feels discomfort is r/t to cough. He does not have a BP cuff at home- advised to purchase a cuff and check readings 2-3x/week with goal <140/90.  All questions answered, patient understands and agrees with plan.      I personally spent 36 minutes on the calendar day of the encounter, including pre and post visit work.    Exie Holler, NP on 05/22/2024 3:58 PM          [1]   Current Outpatient Medications:     fluticasone  (FLONASE ) 50 MCG/ACT nasal spray, Spray 1 spray into each nostril daily., Disp: , Rfl:     insulin  lispro (HUMALOG  KWIKPEN) 100 UNIT/ML injection pen, inject 10 units with breakfast then 15 units with LUNCH and 15 units with dinner, Disp: 42 mL, Rfl: 1    metFORMIN  (GLUCOPHAGE -XR) 500 mg 24 hr tablet, take 1 tablets by mouth every morning and 2 tablet every evening, Disp: 270 tablet, Rfl: 1    Continuous Glucose Sensor (DEXCOM G7 SENSOR), use as directed , CHANGE EVERY 10 DAYS, Disp: 3 each, Rfl: 5    semaglutide , 1 mg/dose, (OZEMPIC ) 4 MG/3ML pen, Inject 1 mg into the skin once a week., Disp: 3 mL, Rfl: 2    insulin  pen needle (DROPLET PEN NEEDLES) 31G X 6 MM, use 1 PEN NEEDLE to inject MEDICATION subcutaneously three times a day as directed, Disp: 200 each, Rfl: 1    JARDIANCE  25 MG tablet, take 1 tablet by mouth every morning, Disp: 90 tablet, Rfl: 1    losartan  (COZAAR ) 25 mg tablet, take 1 tablet by  mouth once daily, Disp: 90 tablet, Rfl: 1    Continuous Blood Gluc Receiver (DEXCOM G7 RECEIVER) DEVI, By 1 each no specified route continuously, Disp: 1 each, Rfl: 5    clindamycin  (CLEOCIN  T) 1 % lotion, Apply topically 2 times daily  to the following areas: back of neck.  Use twice a day as needed for flares of folliculitis., Disp: 60 mL, Rfl: 0    benzonatate (TESSALON) 100 mg capsule, Take 1 capsule (100 mg total) by mouth 3 times daily as needed.  Swallow whole. Do not crush or chew., Disp: 30 capsule, Rfl: 0    ondansetron  4 mg tablet, Take by mouth., Disp: , Rfl:     sildenafil  (REVATIO ) 20 MG tablet, 2-5 tab daily prn, taken 30-60 minutes before sexual activity, Disp: 40 tablet, Rfl: 5    naproxen sodium (ANAPROX) 220 MG tablet, Take 1 tablet (220 mg total) by mouth 2 times daily (with meals). Pt takes as needed, Disp: , Rfl:

## 2024-05-23 LAB — COVID-19 NAAT (PCR): COVID-19 NAAT (PCR): NEGATIVE

## 2024-05-24 ENCOUNTER — Other Ambulatory Visit: Payer: Self-pay

## 2024-05-24 ENCOUNTER — Inpatient Hospital Stay
Admission: EM | Admit: 2024-05-24 | Discharge: 2024-05-27 | DRG: 194 | Disposition: A | Discharge status: Home / Self Care | Source: Ambulatory Visit | Attending: Internal Medicine | Admitting: Internal Medicine

## 2024-05-24 ENCOUNTER — Emergency Department

## 2024-05-24 DIAGNOSIS — R161 Splenomegaly, not elsewhere classified: Secondary | ICD-10-CM

## 2024-05-24 DIAGNOSIS — Z87891 Personal history of nicotine dependence: Secondary | ICD-10-CM

## 2024-05-24 DIAGNOSIS — J189 Pneumonia, unspecified organism: Principal | ICD-10-CM | POA: Diagnosis present

## 2024-05-24 DIAGNOSIS — F17210 Nicotine dependence, cigarettes, uncomplicated: Secondary | ICD-10-CM

## 2024-05-24 DIAGNOSIS — R0602 Shortness of breath: Secondary | ICD-10-CM

## 2024-05-24 DIAGNOSIS — R519 Headache, unspecified: Secondary | ICD-10-CM | POA: Diagnosis present

## 2024-05-24 DIAGNOSIS — I10 Essential (primary) hypertension: Secondary | ICD-10-CM | POA: Diagnosis present

## 2024-05-24 DIAGNOSIS — E119 Type 2 diabetes mellitus without complications: Secondary | ICD-10-CM | POA: Diagnosis present

## 2024-05-24 DIAGNOSIS — R918 Other nonspecific abnormal finding of lung field: Secondary | ICD-10-CM

## 2024-05-24 DIAGNOSIS — E441 Mild protein-calorie malnutrition: Secondary | ICD-10-CM | POA: Diagnosis present

## 2024-05-24 DIAGNOSIS — Z794 Long term (current) use of insulin: Secondary | ICD-10-CM

## 2024-05-24 DIAGNOSIS — R079 Chest pain, unspecified: Secondary | ICD-10-CM

## 2024-05-24 DIAGNOSIS — Z6832 Body mass index (BMI) 32.0-32.9, adult: Secondary | ICD-10-CM

## 2024-05-24 LAB — CBC AND DIFFERENTIAL
Baso # K/uL: 0 10*3/uL (ref 0.0–0.2)
Eos # K/uL: 0.2 10*3/uL (ref 0.0–0.5)
Hematocrit: 42 % (ref 37–52)
Hemoglobin: 14.3 g/dL (ref 12.0–17.0)
IMM Granulocytes #: 0 10*3/uL (ref 0.0–0.0)
IMM Granulocytes: 0.3 %
Lymph # K/uL: 0.8 10*3/uL — ABNORMAL LOW (ref 1.0–5.0)
MCV: 86 fL (ref 75–100)
Mono # K/uL: 0.8 10*3/uL (ref 0.1–1.0)
Neut # K/uL: 9.6 10*3/uL — ABNORMAL HIGH (ref 1.5–6.5)
Nucl RBC # K/uL: 0 10*3/uL (ref 0.0–0.0)
Nucl RBC %: 0 /100{WBCs} (ref 0.0–0.2)
Platelets: 145 10*3/uL — ABNORMAL LOW (ref 150–450)
RBC: 4.8 MIL/uL (ref 4.0–6.0)
RDW: 13.8 % (ref 0.0–15.0)
Seg Neut %: 83.6 %
WBC: 11.5 10*3/uL — ABNORMAL HIGH (ref 3.5–11.0)

## 2024-05-24 LAB — COMPREHENSIVE METABOLIC PANEL
ALT: 19 U/L (ref 0–50)
AST: 17 U/L (ref 0–50)
Albumin: 3.5 g/dL (ref 3.5–5.2)
Alk Phos: 77 U/L (ref 40–130)
Anion Gap: 16 (ref 7–16)
Bilirubin,Total: 1 mg/dL (ref 0.0–1.2)
CO2: 19 mmol/L — ABNORMAL LOW (ref 20–28)
Calcium: 8.8 mg/dL (ref 8.6–10.2)
Chloride: 98 mmol/L (ref 96–108)
Creatinine: 0.95 mg/dL (ref 0.67–1.17)
Glucose: 205 mg/dL — ABNORMAL HIGH (ref 60–99)
Lab: 13 mg/dL (ref 6–20)
Potassium: 3.3 mmol/L (ref 3.3–4.6)
Sodium: 133 mmol/L (ref 133–145)
Total Protein: 6.9 g/dL (ref 6.3–7.7)
eGFR BY CREAT: 93 *

## 2024-05-24 LAB — COVID/INFLUENZA A & B/RSV NAAT (PCR)
COVID-19 NAAT (PCR): NEGATIVE
Influenza A NAAT (PCR): NEGATIVE
Influenza B NAAT (PCR): NEGATIVE
RSV NAAT (PCR): NEGATIVE

## 2024-05-24 LAB — LEGIONELLA ANTIGEN, URINE: Legionella Antigen (Urine): 0

## 2024-05-24 LAB — LACTATE, PLASMA: Lactate: 0.8 mmol/L (ref 0.5–2.2)

## 2024-05-24 LAB — POCT GLUCOSE
Glucose POCT: 140 mg/dL — ABNORMAL HIGH (ref 60–99)
Glucose POCT: 105 mg/dL — ABNORMAL HIGH (ref 60–99)

## 2024-05-24 LAB — S. PNEUMONIAE ANTIGEN: S. pneumoniae Antigen: 0

## 2024-05-24 LAB — PERFORMING LAB

## 2024-05-24 MED ORDER — KETOROLAC TROMETHAMINE 15 MG/ML IJ SOLN *I*
15.0000 mg | Freq: Once | INTRAMUSCULAR | Status: AC
Start: 2024-05-24 — End: 2024-05-24
  Administered 2024-05-24: 15 mg via INTRAVENOUS
  Filled 2024-05-24: qty 1

## 2024-05-24 MED ORDER — IOHEXOL 350 MG/ML (OMNIPAQUE) IV SOLN 500ML BOTTLE *I*
1.0000 mL | Freq: Once | INTRAVENOUS | Status: AC
Start: 2024-05-24 — End: 2024-05-24
  Administered 2024-05-24: 70 mL via INTRAVENOUS

## 2024-05-24 MED ORDER — GLUCOSE 40 % PO GEL *I*
15.0000 g | ORAL | Status: DC | PRN
Start: 2024-05-24 — End: 2024-05-27

## 2024-05-24 MED ORDER — CEFTRIAXONE SODIUM 2 G IN STERILE WATER 20ML SYRINGE *I*
2000.0000 mg | INTRAVENOUS | Status: DC
Start: 2024-05-24 — End: 2024-05-31
  Administered 2024-05-24 – 2024-05-27 (×4): 2000 mg via INTRAVENOUS
  Filled 2024-05-24 (×4): qty 20

## 2024-05-24 MED ORDER — MELATONIN 3 MG PO TABS *I*
6.0000 mg | ORAL_TABLET | Freq: Every evening | ORAL | Status: DC | PRN
Start: 2024-05-24 — End: 2024-05-27
  Administered 2024-05-24 – 2024-05-25 (×2): 6 mg via ORAL
  Filled 2024-05-24 (×2): qty 2

## 2024-05-24 MED ORDER — ONDANSETRON HCL 2 MG/ML IV SOLN *I*
4.0000 mg | Freq: Four times a day (QID) | INTRAMUSCULAR | Status: DC | PRN
Start: 2024-05-24 — End: 2024-07-23

## 2024-05-24 MED ORDER — TRAZODONE HCL 50 MG PO TABS *I*
50.0000 mg | ORAL_TABLET | Freq: Once | ORAL | Status: AC
Start: 2024-05-25 — End: 2024-05-24
  Administered 2024-05-24: 50 mg via ORAL
  Filled 2024-05-24: qty 1

## 2024-05-24 MED ORDER — ENOXAPARIN SODIUM 40 MG/0.4ML IJ SOSY *I*
40.0000 mg | PREFILLED_SYRINGE | Freq: Every day | INTRAMUSCULAR | Status: DC
Start: 2024-05-24 — End: 2024-05-27
  Administered 2024-05-24 – 2024-05-26 (×3): 40 mg via SUBCUTANEOUS
  Filled 2024-05-24 (×3): qty 0.4

## 2024-05-24 MED ORDER — POLYETHYLENE GLYCOL 3350 PO PACK 17 GM *I*
17.0000 g | PACK | Freq: Every day | ORAL | Status: DC | PRN
Start: 2024-05-24 — End: 2024-07-23

## 2024-05-24 MED ORDER — CEFTRIAXONE SODIUM 1 G IN STERILE WATER 10ML SYRINGE *I*
1000.0000 mg | Freq: Once | INTRAVENOUS | Status: DC
Start: 2024-05-24 — End: 2024-05-24

## 2024-05-24 MED ORDER — GLUCAGON HCL (RDNA) 1 MG IJ SOLR *WRAPPED*
1.0000 mg | INTRAMUSCULAR | Status: DC | PRN
Start: 2024-05-24 — End: 2024-07-23

## 2024-05-24 MED ORDER — SODIUM CHLORIDE 0.9 % IV SOLN WRAPPED *I*
100.0000 mL/h | Status: DC
Start: 2024-05-24 — End: 2024-05-25
  Administered 2024-05-24 (×2): 100 mL/h via INTRAVENOUS
  Administered 2024-05-24: 125 mL/h via INTRAVENOUS

## 2024-05-24 MED ORDER — GUAIFENESIN-CODEINE 100-10 MG/5ML PO SYRP *I*
5.0000 mL | ORAL_SOLUTION | ORAL | Status: DC | PRN
Start: 2024-05-24 — End: 2024-05-27
  Administered 2024-05-25: 5 mL via ORAL
  Filled 2024-05-24 (×2): qty 10

## 2024-05-24 MED ORDER — JUICE (FOR HYPOGLYCEMIA) *I*
120.0000 mL | ORAL | Status: DC | PRN
Start: 2024-05-24 — End: 2024-07-23

## 2024-05-24 MED ORDER — SODIUM CHLORIDE 0.9 % 250 ML IV SOLN *I*
100.0000 mg | Freq: Two times a day (BID) | INTRAVENOUS | Status: DC
Start: 2024-05-24 — End: 2024-05-31
  Administered 2024-05-24 – 2024-05-25 (×2): 100 mg via INTRAVENOUS
  Filled 2024-05-24 (×2): qty 10

## 2024-05-24 MED ORDER — DEXTROSE 50 % IV SOLN *I*
25.0000 g | INTRAVENOUS | Status: DC | PRN
Start: 2024-05-24 — End: 2024-07-23

## 2024-05-24 MED ORDER — INSULIN LISPRO (HUMAN) 100 UNIT/ML IJ/SC SOLN *WRAPPED*
0.0000 [IU] | Freq: Three times a day (TID) | SUBCUTANEOUS | Status: DC
Start: 2024-05-24 — End: 2024-07-23
  Administered 2024-05-24: 2 [IU] via SUBCUTANEOUS
  Administered 2024-05-24 – 2024-05-25 (×2): 5 [IU] via SUBCUTANEOUS
  Administered 2024-05-25 – 2024-05-26 (×3): 7 [IU] via SUBCUTANEOUS
  Administered 2024-05-26 – 2024-05-27 (×2): 5 [IU] via SUBCUTANEOUS

## 2024-05-24 MED ORDER — LOSARTAN POTASSIUM 25 MG PO TABS *I*
25.0000 mg | ORAL_TABLET | Freq: Every evening | ORAL | Status: DC
Start: 2024-05-24 — End: 2024-07-23
  Administered 2024-05-24: 25 mg via ORAL
  Filled 2024-05-24: qty 1

## 2024-05-24 MED ORDER — BENZONATATE 100 MG PO CAPS *I*
100.0000 mg | ORAL_CAPSULE | Freq: Three times a day (TID) | ORAL | Status: DC | PRN
Start: 2024-05-24 — End: 2024-07-23
  Administered 2024-05-24: 100 mg via ORAL
  Filled 2024-05-24: qty 1

## 2024-05-24 MED ORDER — SODIUM CHLORIDE 0.9 % 250 ML IV SOLN *I*
100.0000 mg | Freq: Once | INTRAVENOUS | Status: DC
Start: 2024-05-24 — End: 2024-05-24

## 2024-05-24 NOTE — Progress Notes (Signed)
 Pharmacist Prior-to-Admission Medication History Review     I reviewed the medication history that was performed by a medication history technician and the list below reflects the best possible prior to admission medication list.     Sources of information:  Patient;Outside Meds Reconciliation ([facility])        Comments: This medication reconciliation was completed by pharmacy after the physician completed his/her portion of the admission med reconciliation.     Recommendation:  Please re-review the home med list and make any appropriate medication changes to the inpatient medication orders      Removed (patient no longer taking): cleocin  T, zofran , revatio        Current Outpatient Medications   Medication Instructions    benzonatate (TESSALON) 100 mg, Oral, 3 TIMES DAILY PRN,  Swallow whole. Do not crush or chew.    Continuous Blood Gluc Receiver (DEXCOM G7 RECEIVER) DEVI 1 each, Does not apply, CONTINUOUS    Continuous Glucose Sensor (DEXCOM G7 SENSOR) use as directed , CHANGE EVERY 10 DAYS    fluticasone  (FLONASE ) 50 MCG/ACT nasal spray 2 sprays, DAILY    insulin  lispro (HUMALOG  KWIKPEN) 100 UNIT/ML injection pen inject 10 units with breakfast then 15 units with LUNCH and 15 units with dinner    insulin  pen needle (DROPLET PEN NEEDLES) 31G X 6 MM use 1 PEN NEEDLE to inject MEDICATION subcutaneously three times a day as directed    Jardiance  25 mg, Oral, EVERY MORNING    losartan  (COZAAR ) 25 mg, Oral, DAILY    metFORMIN  (GLUCOPHAGE -XR) 500 mg 24 hr tablet take 1 tablets by mouth every morning and 2 tablet every evening    naproxen sodium (ANAPROX) 220 mg, 2 TIMES DAILY WITH MEALS    semaglutide  (1 mg/dose) (OZEMPIC ) 1 mg, Subcutaneous, WEEKLY           Morrie Arbour, PharmD

## 2024-05-24 NOTE — ED Triage Notes (Signed)
 Pt is a 58 year old male who presents to the ED today by car from home for a cough (productive brownish), diaphoretic, fever and general body aches.  Pt is also reporting some periods of chest tightness.  Pt states that the symptoms started this past Friday.    Prehospital medications given: Yes  Analgesia: Tylenol  (1000mg  at 8:30am)

## 2024-05-24 NOTE — Progress Notes (Signed)
 Notified patient having ongoing coughing despite Tessalon pearls.  Added guaifenesin with codeine.  Also given trazodone  for insomnia refractory to melatonin.

## 2024-05-24 NOTE — ED Notes (Signed)
 EKG obtained and handed to Dr. Lujean Sake

## 2024-05-24 NOTE — H&P (Signed)
 History and Physical  Hospital Medicine    Patient Name: Noah Craig  Date of Birth: 02-13-1966  MRN#: [id number]  Admission Date: 05/24/2024  Date of Service: 05/24/2024    Chief Complaint:   Cough    History of Present Illness:  HPI  58 year old male patient with multiple previous medical history and significant for arthritis, hypertension, and diabetes mellitus type 2.  Patient presented to the emergency room today because of cough.  Patient said his condition has been going on for the past 6 weeks characterized as productive cough with grayish sputum associated with generalized body weakness, fatigue, cold sweats, chills, poor oral intake and about 10 pound weight loss.  Patient said that he was seen by his PCP more than 2 weeks ago and his viral swabs were negative including COVID and it was presumed that he had an infection and he was given a 10-day course of Augmentin .  Apparently he felt better for about 10 days, but 2 days ago his symptoms came back, so he went back to his PCP and they ordered some blood work as well as CT of the chest.  But this morning he felt really weak, tired, and his shortness of breath is significantly worse prompting this emergency room visit.  No chest pain, no abdominal pain, no nausea, no vomiting, he did have an episode of diarrhea 2 days ago which was watery, nonbloody, nonmucoid.    Past Medical History:   Diagnosis Date    Arthritis     knees, hands    Colon polyp     Complication of anesthesia     slow to awaken    Diabetes mellitus     Hypertension      Past Surgical History:   Procedure Laterality Date    COLONOSCOPY      JOINT REPLACEMENT      KNEE SURGERY Bilateral     reconstruction; several surgeries    PR TYMPANOSTOMY GENERAL ANESTHESIA Bilateral 02/21/2024    Procedure: INSERTION, TYMPANOSTOMY TUBE, BILATERAL;  Surgeon: Kaza, Srinivas, MD;  Location: [facility] OR;  Service: ENT    TONSILLECTOMY       Family History   Problem Relation Age of Onset    Heart Disease Father       Social History     Socioeconomic History    Marital status: Married   Tobacco Use    Smoking status: Former     Packs/day: 1.00     Years: 10.00     Additional pack years: 0.00     Total pack years: 10.00     Types: Cigarettes     Quit date: 1992     Years since quitting: 33.4    Smokeless tobacco: Former     Quit date: 01/01/1992   Substance and Sexual Activity    Alcohol use: Yes     Comment:  monthly    Drug use: No       Allergies: Allergies[1]    (Not in a hospital admission)     Current Facility-Administered Medications   Medication Dose Route Frequency    sodium chloride  0.9 % IV  100 mL/hr Intravenous Continuous    benzonatate (TESSALON) capsule 100 mg  100 mg Oral TID PRN    losartan  (COZAAR ) tablet 25 mg  25 mg Oral Daily    enoxaparin (LOVENOX) injection 40 mg  40 mg Subcutaneous Daily @ 2100    polyethylene glycol (GLYCOLAX ,MIRALAX ) powder 17 g  17 g Oral Daily  PRN    ondansetron  (ZOFRAN ) injection 4 mg  4 mg Intravenous Q6H PRN    melatonin tablet 6 mg  6 mg Oral QHS PRN    insulin  lispro (HumaLOG ,ADMELOG ) injection 0-19 units  0-19 units Subcutaneous TID WC    Juice (for hypoglycemia) 120 mL  120 mL Oral PRN    dextrose (GLUTOSE) 40 % oral gel 15 g of Glucose  15 g of Glucose Oral PRN    dextrose 50% (0.5 g/mL) injection 25 g  25 g Intravenous PRN    glucagon (GLUCAGEN) injection 1 mg  1 mg Intramuscular PRN    cefTRIAXone (ROCEPHIN) 2,000 mg in sterile water IV syringe 20 mL  2,000 mg Intravenous Q24H    doxycycline  (VIBRAMYCIN ) 100 mg in sodium chloride  0.9% 250 mL  100 mg Intravenous 2 times per day     Current Outpatient Medications   Medication    fluticasone  (FLONASE ) 50 MCG/ACT nasal spray    benzonatate (TESSALON) 100 mg capsule    insulin  lispro (HUMALOG  KWIKPEN) 100 UNIT/ML injection pen    metFORMIN  (GLUCOPHAGE -XR) 500 mg 24 hr tablet    Continuous Glucose Sensor (DEXCOM G7 SENSOR)    semaglutide , 1 mg/dose, (OZEMPIC ) 4 MG/3ML pen    insulin  pen needle (DROPLET PEN NEEDLES) 31G X 6 MM     JARDIANCE  25 MG tablet    losartan  (COZAAR ) 25 mg tablet    ondansetron  4 mg tablet    Continuous Blood Gluc Receiver (DEXCOM G7 RECEIVER) DEVI    clindamycin  (CLEOCIN  T) 1 % lotion    sildenafil  (REVATIO ) 20 MG tablet    naproxen sodium (ANAPROX) 220 MG tablet       Review of Systems:   Review of Systems   Constitutional:  Positive for chills, malaise/fatigue and weight loss. Negative for fever.   HENT:  Negative for sore throat.    Eyes:  Negative for pain.   Respiratory:  Positive for cough, sputum production and shortness of breath.    Cardiovascular:  Negative for chest pain and palpitations.   Gastrointestinal:  Positive for diarrhea and nausea. Negative for abdominal pain and vomiting.   Genitourinary: Negative.    Musculoskeletal: Negative.    Skin:  Negative for itching and rash.   All other systems reviewed and are negative.        Last Nursing documented pain:    Patient's stated pain goal on admission:        Patient Vitals for the past 24 hrs:   BP Temp Temp src SpO2 Height Weight   05/24/24 1100 171/79 -- -- 93 % -- --   05/24/24 1034 173/83 -- -- 93 % -- --   05/24/24 0952 151/81 37 C (98.6 F) Infrared 95 % 1.88 m (6\' 2" ) 112.5 kg (248 lb)            Physical Exam  Constitutional:       General: He is not in acute distress.     Appearance: He is ill-appearing.   HENT:      Head: Normocephalic and atraumatic.      Nose: Nose normal.      Mouth/Throat:      Mouth: Mucous membranes are dry.   Eyes:      General:         Right eye: No discharge.         Left eye: No discharge.   Cardiovascular:      Rate and Rhythm: Normal rate and regular  rhythm.   Pulmonary:      Effort: Pulmonary effort is normal. No respiratory distress.      Breath sounds: Rales (Right mid and basal lung fields) present. No wheezing or rhonchi.   Abdominal:      Palpations: Abdomen is soft.      Tenderness: There is no abdominal tenderness.   Musculoskeletal:      Cervical back: Neck supple.      Right lower leg: No edema.       Left lower leg: No edema.   Skin:     General: Skin is warm and dry.   Neurological:      General: No focal deficit present.      Mental Status: He is alert.         Lab Results: All labs in the last 72 hours:  Recent Results (from the past 72 hours)   COVID-19 NAAT (PCR)    Collection Time: 05/22/24  8:09 PM   Result Value Ref Range    COVID-19 Source  Nasopharyngeal     COVID-19 NAAT (PCR) NEGATIVE Negative   CBC and differential    Collection Time: 05/24/24 10:27 AM   Result Value Ref Range    WBC 11.5 (H) 3.5 - 11.0 THOU/uL    RBC 4.8 4.0 - 6.0 MIL/uL    Hemoglobin 14.3 12.0 - 17.0 g/dL    Hematocrit 42 37 - 52 %    MCV 86 75 - 100 fL    RDW 13.8 0.0 - 15.0 %    Platelets 145 (L) 150 - 450 THOU/uL    Seg Neut % 83.6 %    Neut # K/uL 9.6 (H) 1.5 - 6.5 THOU/uL    Lymph # K/uL 0.8 (L) 1.0 - 5.0 THOU/uL    Mono # K/uL 0.8 0.1 - 1.0 THOU/uL    Eos # K/uL 0.2 0.0 - 0.5 THOU/uL    Baso # K/uL 0.0 0.0 - 0.2 THOU/uL    Nucl RBC % 0.0 0.0 - 0.2 /100 WBC    Nucl RBC # K/uL 0.0 0.0 - 0.0 THOU/uL    IMM Granulocytes # 0.0 0.0 - 0.0 THOU/uL    IMM Granulocytes 0.3 %   Comprehensive metabolic panel    Collection Time: 05/24/24 10:27 AM   Result Value Ref Range    Sodium 133 133 - 145 mmol/L    Potassium 3.3 3.3 - 4.6 mmol/L    Chloride 98 96 - 108 mmol/L    CO2 19 (L) 20 - 28 mmol/L    Anion Gap 16 7 - 16    UN 13 6 - 20 mg/dL    Creatinine 9.60 4.54 - 1.17 mg/dL    eGFR BY CREAT 93 *    Glucose 205 (H) 60 - 99 mg/dL    Calcium 8.8 8.6 - 09.8 mg/dL    Total Protein 6.9 6.3 - 7.7 g/dL    Albumin 3.5 3.5 - 5.2 g/dL    Bilirubin,Total 1.0 0.0 - 1.2 mg/dL    AST 17 0 - 50 U/L    ALT 19 0 - 50 U/L    Alk Phos 77 40 - 130 U/L   Lactate, plasma    Collection Time: 05/24/24 10:27 AM   Result Value Ref Range    Lactate 0.8 0.5 - 2.2 mmol/L   COVID/Influenza A & B/RSV NAAT (PCR)    Collection Time: 05/24/24 10:28 AM   Result Value Ref Range    COVID-19 Source  Nasopharyngeal     COVID-19 NAAT (PCR) NEGATIVE Negative    Influenza A NAAT  (PCR) NEGATIVE Negative    Influenza B NAAT (PCR) NEGATIVE Negative    RSV NAAT (PCR) NEGATIVE Negative   Performing Lab    Collection Time: 05/24/24 10:28 AM   Result Value Ref Range    Performing Lab see below        Radiology Impressions (last 3 days):  CT chest with contrast  Result Date: 05/24/2024  Multifocal patchy airspace and groundglass opacities in the central right upper lobe and throughout the right lower lobe suggesting a multifocal or viral pneumonia. Splenomegaly and likely hepatomegaly. END OF IMPRESSION       Currently Active/Followed Hospital Problems:  Active Hospital Problems    Diagnosis     *!*CAP (community acquired pneumonia)     Type 2 diabetes mellitus     Essential hypertension     SOB (shortness of breath)        Assessment/Plan:     58 year old male patient with multiple previous medical history and significant for arthritis, hypertension, and diabetes mellitus type 2.  Patient presented to the emergency room today because of cough.  Initial workup done at the ED showed a CBC with a WBC count of 11.5, hemoglobin of 14.3, hematocrit of 42 with a platelet count of 145K.  Basic metabolic panel was generally unremarkable although the bicarb is slightly low at 19 with normal liver enzymes.  His lactate is normal at 0.8, while his viral swabs including COVID are negative.  His EKG shows sinus rhythm.  CT of the chest is still pending, although what reading seems to show infiltrates at the right lower lobe.    Plan of care or management includes:  Pneumonia  -Nonresolving  - Admit  - IV fluids  - Cultures  - Legionella and strep pneumonia urine antigen  - O2 supplementation as needed  - Analgesics  - Antiemetics  - Empiric IV antibiotics with Rocephin and doxycycline   - Repeat labs in a.m.    Diabetes mellitus, 2  - Insulin  sliding scale  - Hold oral hypoglycemics    Hypertension  - Continue losartan  with holding parameters      Medication Reconciliation: Done  Diet: Diabetic  IVF: N/A  DVT  prophylaxis: Lovenox  Code Status/GOC: Full  Family update/concerns: Spouse at bedside  EDD:5.27.2025             Signed:  Iona Manis, MD  05/24/2024  12:04 PM       Author: Iona Manis, MD  Note created: 05/24/2024  at: 12:04 PM          Dictation software used to create this note, small grammatical errors may be present.         [1]   Allergies  Allergen Reactions    Lisinopril  Cough    Victoza  [Liraglutide ] Other (See Comments)     GI upset    Environmental Allergies Other (See Comments)     Sneezing

## 2024-05-24 NOTE — ED Provider Notes (Signed)
 History     Chief Complaint   Patient presents with    Fever    Generalized Body Aches    Cough     This a 58 year old male presents emergency department today with cough x 6 weeks, which is not improved after being on cough medicine, and a course of Augmentin .  Patient was seen approximately 2-1/2 weeks ago, placed on a week of Augmentin , with Tessalon, but has not improved.  Patient saw his PCP the other day, and once worsening, with fevers this morning, so he came into the emergency department for reevaluation.  Patient had some rigors and sweats at home, and has been worsening with a cough to the point where he feels like he is going to throw up after he coughs.  Patient has a history of diabetes, a history of hypertension, denies any chest pain other than with coughing, denies any abdominal pain, back pain, or urinary symptoms.  Patient states that he has a CT of his chest scheduled for June, and has not had any other imaging of his chest yet      History provided by:  Patient and medical records  Fever  Max temp prior to arrival:  Unknown, no thermometer at home  Temp source:  Subjective  Severity:  Moderate  Onset quality:  Sudden  Duration:  6 weeks  Timing:  Intermittent  Progression:  Worsening  Chronicity:  New  Relieved by:  Nothing  Worsened by:  Nothing  Ineffective treatments:  None tried (antibiotics and tessalon)  Associated symptoms: chills, cough and nausea    Associated symptoms: no chest pain, no myalgias and no vomiting          Medical/Surgical/Family History     Past Medical History:   Diagnosis Date    Arthritis     knees, hands    Colon polyp     Complication of anesthesia     slow to awaken    Diabetes mellitus     Hypertension         Patient Active Problem List   Diagnosis Code    Essential hypertension I10    Mixed hyperlipidemia E78.2    Strain of Achilles tendon, right, initial encounter S86.011A    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    Mixed conductive and sensorineural hearing loss, 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 Negative for fatigue.   HENT: Negative.     Respiratory:  Positive for cough. Negative for shortness of breath.    Cardiovascular:  Negative for chest pain.   Gastrointestinal:  Positive for nausea. Negative for abdominal distention, abdominal pain and vomiting.   Genitourinary: Negative.  Musculoskeletal:  Negative for back pain and myalgias.   Skin:  Negative for wound.   Neurological:  Negative for dizziness, syncope, speech difficulty, weakness and numbness.   All other systems reviewed and are negative.      Physical Exam     Triage Vitals  Triage Start: Start, (05/24/24 1610)  First Recorded BP: 151/81, Temp: 37 C (98.6 F), Temp src: Infrared Oxygen Therapy SpO2: 95 %, Oximetry Source: Lt Hand, O2 Device: None (Room air),   Heart Rate (via Pulse Ox): 94, (05/24/24 0952).      Physical Exam  Vitals and nursing note reviewed.   Constitutional:       General: He is not in acute distress.     Appearance: Normal appearance.   HENT:      Head: Normocephalic and atraumatic.   Eyes:      Extraocular Movements: Extraocular movements intact.      Pupils: Pupils are equal, round, and reactive to light.   Cardiovascular:      Rate and Rhythm: Normal rate and regular rhythm.      Heart sounds: No murmur heard.  Pulmonary:      Effort:  Pulmonary effort is normal. No respiratory distress.      Breath sounds: Rhonchi present.      Comments: Diminished breath sounds, especially right base compared to rest of lung fields with ronchi   Abdominal:      General: Abdomen is flat. Bowel sounds are normal. There is no distension.      Palpations: Abdomen is soft.      Tenderness: There is no abdominal tenderness.   Musculoskeletal:         General: Normal range of motion.   Skin:     General: Skin is warm and dry.   Neurological:      General: No focal deficit present.      Mental Status: He is alert and oriented to person, place, and time.      Cranial Nerves: No cranial nerve deficit.         Medical Decision Making   Patient seen by me on:  05/24/2024    Assessment:  This a 58 year old male presents to the emergency department today with cough, shortness of breath, and intermittent fevers for the past several weeks.  Patient was treated on Augmentin  for a week and is not improving    Differential diagnosis:  Pneumonia, viral syndrome, COVID, flu, RSV, doubt pulmonary embolism, possible sepsis    Plan:  Labs, blood work, CT of the chest, and reevaluation    Orders Placed This Encounter      Blood culture      Blood culture      COVID/Influenza A & B/RSV NAAT (PCR)      Legionella antigen, urine      Strep pneumoniae antigen      CT chest with contrast      CBC and differential      Comprehensive metabolic panel      Lactate, plasma      Lactate, plasma (CONDITIONAL)      Performing Lab      Basic metabolic panel      Magnesium      Phosphorus      TSH      CBC and differential      Hemoglobin A1c      Diet Consistent Carbohydrate      Vital signs      Notify provider for  vitals out of parameters      Activity as tolerated      Weigh patient      Monitor Intake and output      Elevate heels off of bed      Maintain oxygen level      Trigger Order for Pneumonia Subset      Elevate head of bed 45 degrees      Pulse oximetry      Place ted hose      Full  code      Initiate COVID precautions      Initiate droplet isolation      Incentive spirometry RT      EKG 12 lead      EKG 12 lead      Admit to Hospital Inpatient      Admit to [facility] Certification Order (Placed by Attending)      Aspiration precautions    Medications  sodium chloride  0.9 % IV (100 mL/hr Intravenous Rate/Dose Change 05/24/24 1232)  benzonatate (TESSALON) capsule 100 mg (has no administration in time range)  losartan  (COZAAR ) tablet 25 mg (has no administration in time range)  enoxaparin (LOVENOX) injection 40 mg (has no administration in time range)  polyethylene glycol (GLYCOLAX ,MIRALAX ) powder 17 g (has no administration in time range)  ondansetron  (ZOFRAN ) injection 4 mg (has no administration in time range)  melatonin tablet 6 mg (has no administration in time range)  insulin  lispro (HumaLOG ,ADMELOG ) injection 0-19 units (has no administration in time range)  Juice (for hypoglycemia) 120 mL (has no administration in time range)  dextrose (GLUTOSE) 40 % oral gel 15 g of Glucose (has no administration in time range)  dextrose 50% (0.5 g/mL) injection 25 g (has no administration in time range)  glucagon (GLUCAGEN) injection 1 mg (has no administration in time range)  cefTRIAXone (ROCEPHIN) 2,000 mg in sterile water IV syringe 20 mL (2,000 mg Intravenous Given 05/24/24 1232)  doxycycline  (VIBRAMYCIN ) 100 mg in sodium chloride  0.9% 250 mL (has no administration in time range)  ketorolac  (TORADOL ) 15 mg/mL injection 15 mg (15 mg Intravenous Given 05/24/24 1034)  iohexol  (OMNIPAQUE ) injection ( multi-use bottle) 1-150 mL (70 mLs Intravenous Given 05/24/24 1138)      EKG Interpretation:  Normal sinus rhythm, rate of 92, PR 158, QRS 92, QTc 432, normal sinus rhythm without ischemic changes, tracing reviewed by myself, normal sinus rhythm, no ischemic changes    Review of existing & external labs / records: Patient has been managed as an outpatient for the past several weeks,  treated as presumptive pneumonia with Augmentin , patient has been on Tessalon, but is not clinically improving    Independent interpretation of imaging: CT of the chest shows dense right lower lobe infiltrate consistent with lobar pneumonia, no other acute findings.      Other MDM elements: Yes history per patient, chart review    ED Course and Disposition:      10:00 AM patient seen and evaluated, patient does have significant symptoms, which been worsening.  He is afebrile here in the emergency department, but given his symptoms, will initiate blood work, including cultures and a lactic acid, and will obtain a CT scan, as he has not had any imaging at this point.  I do think the CT scan will be more appropriate given the length of his symptoms to rule out any other causes of his cough.  Will continue to monitor at this  point.    11:00 AM patient CBC and BMP have returned, showing a white count of 11.5, but otherwise normal on the CMP standpoint.  His metabolic profile is notable for CO2 of 19, and a glucose of 205.  COVID flu and RSV are negative, lactic acid 0.8.  Awaiting CT scan at this time.    11:30 AM patient CT scan shows a dense right lower lobe pneumonia, likely the source of his symptoms.  Based on this, I did discuss with the patient, will start IV antibiotics.  I will give him Rocephin and doxycycline , with an escalation of his therapy from Augmentin .  I did discuss with him about admission, and he is amenable to this.  I also discussed the case with Dr. Halford Levels, the hospitalist, who accepts to his care for management of the patient's pneumonia.           Yvette Hercules, DO              [1]   Social History  Tobacco Use    Smoking status: Former     Packs/day: 1.00     Years: 10.00     Additional pack years: 0.00     Total pack years: 10.00     Types: Cigarettes     Quit date: 1992     Years since quitting: 33.4    Smokeless tobacco: Former     Quit date: 01/01/1992   Substance Use Topics    Alcohol use:  Yes     Comment:  monthly    Drug use: No        Yvette Hercules, DO  05/24/24 1249

## 2024-05-25 LAB — CBC AND DIFFERENTIAL
Baso # K/uL: 0 10*3/uL (ref 0.0–0.2)
Eos # K/uL: 0.4 10*3/uL (ref 0.0–0.5)
Hematocrit: 39 % (ref 37–52)
Hemoglobin: 13.5 g/dL (ref 12.0–17.0)
IMM Granulocytes #: 0 10*3/uL (ref 0.0–0.0)
IMM Granulocytes: 0.4 %
Lymph # K/uL: 1.6 10*3/uL (ref 1.0–5.0)
MCV: 87 fL (ref 75–100)
Mono # K/uL: 0.8 10*3/uL (ref 0.1–1.0)
Neut # K/uL: 5.2 10*3/uL (ref 1.5–6.5)
Nucl RBC # K/uL: 0 10*3/uL (ref 0.0–0.0)
Nucl RBC %: 0 /100{WBCs} (ref 0.0–0.2)
Platelets: 163 10*3/uL (ref 150–450)
RBC: 4.5 MIL/uL (ref 4.0–6.0)
RDW: 13.6 % (ref 0.0–15.0)
Seg Neut %: 64.9 %
WBC: 8 10*3/uL (ref 3.5–11.0)

## 2024-05-25 LAB — TSH: TSH: 2.25 u[IU]/mL (ref 0.27–4.20)

## 2024-05-25 LAB — GRAM STAIN: Gram Stain: 0

## 2024-05-25 LAB — PROCALCITONIN: Procalcitonin: 0.18 ng/mL — ABNORMAL HIGH (ref 0.00–0.08)

## 2024-05-25 LAB — BASIC METABOLIC PANEL
Anion Gap: 11 (ref 7–16)
CO2: 21 mmol/L (ref 20–28)
Calcium: 8.8 mg/dL (ref 8.6–10.2)
Chloride: 109 mmol/L — ABNORMAL HIGH (ref 96–108)
Creatinine: 0.84 mg/dL (ref 0.67–1.17)
Glucose: 108 mg/dL — ABNORMAL HIGH (ref 60–99)
Lab: 13 mg/dL (ref 6–20)
Potassium: 3.5 mmol/L (ref 3.3–4.6)
Sodium: 141 mmol/L (ref 133–145)
eGFR BY CREAT: 102 *

## 2024-05-25 LAB — EKG 12-LEAD
P: 10 deg
PR: 158 ms
QRS: 45 deg
QRSD: 92 ms
QT: 349 ms
QTc: 432 ms
Rate: 92 {beats}/min
T: 132 deg

## 2024-05-25 LAB — META/RHINO/ADENO VIRUS NAAT (PCR)
Adenovirus NAAT (PCR): NEGATIVE
Metapneumovirus NAAT (PCR): NEGATIVE
Rhinovirus NAAT (PCR): NEGATIVE

## 2024-05-25 LAB — PARAINFLUENZA 1-4 NAAT (PCR)
Parainfluenza virus 1 NAAT (PCR): NEGATIVE
Parainfluenza virus 2 NAAT (PCR): NEGATIVE
Parainfluenza virus 3 NAAT (PCR): NEGATIVE
Parainfluenza virus 4 NAAT (PCR): NEGATIVE

## 2024-05-25 LAB — MAGNESIUM: Magnesium: 2 mg/dL (ref 1.6–2.5)

## 2024-05-25 LAB — PHOSPHORUS: Phosphorus: 2.9 mg/dL (ref 2.7–4.5)

## 2024-05-25 LAB — POCT GLUCOSE
Glucose POCT: 107 mg/dL — ABNORMAL HIGH (ref 60–99)
Glucose POCT: 143 mg/dL — ABNORMAL HIGH (ref 60–99)
Glucose POCT: 159 mg/dL — ABNORMAL HIGH (ref 60–99)
Glucose POCT: 198 mg/dL — ABNORMAL HIGH (ref 60–99)

## 2024-05-25 LAB — TROPONIN T RANDOM HIGH SENSITIVITY
TROP T RANDOM High Sensitivity: 10 ng/L (ref 0–14)
TROP T RANDOM High Sensitivity: 8 ng/L (ref 0–14)

## 2024-05-25 MED ORDER — LABETALOL HCL 20 MG/4 ML IV SOLN WRAPPED *I*
10.0000 mg | INTRAVENOUS | Status: DC | PRN
Start: 2024-05-25 — End: 2024-05-27

## 2024-05-25 MED ORDER — LABETALOL HCL 20 MG/4 ML IV SOLN WRAPPED *I*
10.0000 mg | Freq: Once | INTRAVENOUS | Status: AC
Start: 2024-05-25 — End: 2024-05-25
  Administered 2024-05-25: 10 mg via INTRAVENOUS
  Filled 2024-05-25: qty 4

## 2024-05-25 MED ORDER — PREDNISONE 20 MG PO TABS *I*
40.0000 mg | ORAL_TABLET | Freq: Every day | ORAL | Status: DC
Start: 2024-05-25 — End: 2024-05-26
  Administered 2024-05-25: 40 mg via ORAL
  Filled 2024-05-25: qty 2

## 2024-05-25 MED ORDER — GUAIFENESIN 600 MG PO TB12 *I*
600.0000 mg | ORAL_TABLET | Freq: Two times a day (BID) | ORAL | Status: DC
Start: 2024-05-25 — End: 2024-05-27
  Administered 2024-05-25 – 2024-05-27 (×5): 600 mg via ORAL
  Filled 2024-05-25 (×5): qty 1

## 2024-05-25 MED ORDER — LOSARTAN POTASSIUM 50 MG PO TABS *I*
50.0000 mg | ORAL_TABLET | Freq: Every evening | ORAL | Status: DC
Start: 2024-05-25 — End: 2024-07-23
  Administered 2024-05-25 – 2024-05-26 (×2): 50 mg via ORAL
  Filled 2024-05-25 (×2): qty 1

## 2024-05-25 MED ORDER — ALBUTEROL SULFATE (2.5 MG/3ML) 0.083% IN NEBU *I*
2.5000 mg | INHALATION_SOLUTION | RESPIRATORY_TRACT | Status: DC | PRN
Start: 2024-05-25 — End: 2024-05-27

## 2024-05-25 MED ORDER — DOXYCYCLINE HYCLATE 100 MG PO TABS *I*
100.0000 mg | ORAL_TABLET | Freq: Two times a day (BID) | ORAL | Status: DC
Start: 2024-05-25 — End: 2024-05-27
  Administered 2024-05-25 – 2024-05-27 (×4): 100 mg via ORAL
  Filled 2024-05-25 (×4): qty 1

## 2024-05-25 MED ORDER — IPRATROPIUM-ALBUTEROL 0.5-2.5 MG/3ML IN SOLN *I*
3.0000 mL | Freq: Four times a day (QID) | RESPIRATORY_TRACT | Status: DC
Start: 2024-05-25 — End: 2024-07-24
  Administered 2024-05-25 – 2024-05-27 (×8): 3 mL via RESPIRATORY_TRACT
  Filled 2024-05-25 (×8): qty 3

## 2024-05-25 MED ORDER — BENZONATATE 100 MG PO CAPS *I*
200.0000 mg | ORAL_CAPSULE | Freq: Three times a day (TID) | ORAL | Status: DC | PRN
Start: 2024-05-25 — End: 2024-07-23
  Administered 2024-05-27: 200 mg via ORAL
  Filled 2024-05-25: qty 2

## 2024-05-25 NOTE — Plan of Care (Signed)
 Problem: Safety  Goal: Patient will remain free of falls  Outcome: Maintaining     Problem: Pain/Comfort  Goal: Patient's pain or discomfort is manageable  Outcome: Maintaining     Problem: Nutrition  Goal: Patient's nutritional status is maintained or improved  Outcome: Maintaining     Problem: Mobility  Goal: Patient's functional status is maintained or improved  Outcome: Maintaining     Problem: Psychosocial  Goal: Demonstrates ability to cope with illness  Outcome: Maintaining     Problem: Cognitive function  Goal: Cognitive function will be maintained or return to baseline  Description: Interventions:  Delirium Assessment  LIVEBAR Assessment    Outcome: Maintaining  Goal: Lines and tethers will be removed as appropriate  Outcome: Maintaining  Goal: Patient maintains appropriate nutritional intake  Outcome: Maintaining  Goal: Vital signs will be within normal limits  Outcome: Maintaining  Goal: Evidence for potential causes of delirium will be managed  Outcome: Maintaining  Goal: Behaviors will return to baseline  Outcome: Maintaining  Goal: Ambulation and mobility will be maintained  Outcome: Maintaining  Goal: Retention of urine and constipation will be managed  Outcome: Maintaining     Problem: Risk for Impaired Sleep/Wake Cycle  Goal: The patient will maintain an adequate sleep/wake cycle  Outcome: Maintaining

## 2024-05-25 NOTE — Progress Notes (Addendum)
 Daily Progress Note  Hospital Medicine      Patient Name: Noah Craig  Date of Birth: October 23, 1966  MRN#: [id number]  Admission Date: 05/24/2024  Date of Service: 05/25/2024    Hospital Problem List  Active Hospital Problems    Diagnosis    CAP (community acquired pneumonia)    Type 2 diabetes mellitus    Essential hypertension    SOB (shortness of breath)      Resolved Hospital Problems   No resolved problems to display.        Assessment and Plan:  58 year old male patient with multiple previous medical history and significant for arthritis, hypertension, and diabetes mellitus type 2.  Patient presented to the emergency room 5/25 because of cough.  Initial workup done at the ED showed a CBC with a WBC count of 11.5,plt of 145K.  CO2 at 19 with normal liver enzymes.  His lactate is normal at 0.8, while his viral swabs including COVID are negative.  His EKG shows sinus rhythm.  CT of the chest  showing multilobar pneumonia     Per patient this cough has been ongoing the last 6 weeks completed a course of Augmentin  as outpatient     Multiple lobar Pneumonia right  - completed out patient course of antibitoic no success  - CT showing right sided pneumonia  - on going cough  - started on ceftriaxone and doxycyline continue   - start duoneb and prn albuterol    - remote history of smoking having some exp wh will do prednisone burst  - mucinex  and guifensien with codiene   - incentive spirometer   - on RA   -  viral panel neg, check extend viral panel  -  legionella, strep pneumonia neg  - sputum sample in process  - procal 0.18    Diabetes mellitus, 2  - on jardiance , metformian osempic at home  - SSI mid scale  - hold home oral   - last A1c 12/24 6.4     Hypertension   Uncontrolled  Headache  - bp> 200 labetolol 10 mg x 1 given   - placed on telemetry for now   - will increase losartan  to 50 mg and give now   - per patient bp is 150s baseline in the doctor office    Medication Reconciliation:  Yes  Fluid/Electrolytes/Nutrition or Diet: diabetic   Mobility: as tolerated   Indication for Lines/Tubes/Foley catheter/Telemetry: piv   DVT prophylaxis:Lovenox  Smoking Cessation: NA  Code Status/GOC:  full   Family update/concern: pt updated on plan   Disposition Barriers: hope 5/27 dispo pending improvement in symptoms     (EDD): 05/26/2024      Plan and orders for day reviewed with nursing? Yes.      Interval History  Patient seen and examined, reviewed event and data   I did not sleep well from cough  No fever, chills,   Tolerating some po  Labs         Physical Exam:  Vital Signs:  BP 151/66 (BP Location: Left arm)   Pulse 84   Temp 36.3 C (97.3 F) (Temporal)   Resp 18   Ht 1.88 m (6\' 2" )   Wt 113.3 kg (249 lb 12.8 oz)   SpO2 90%   BMI 32.07 kg/m     Const: comfortable   HEENT:wnl  CVS:rrr  PULM:exp wh baseline   OZ:DGUY + bs   GU:a/o x 3   EXT: warm perfusing no  edema   NEURO:a/o x 3   SKIN:no open area       Current MEDS    Scheduled                                                                                                                                         guaiFENesin  600 mg Oral 2 times per day    losartan   25 mg Oral QPM    enoxaparin  40 mg Subcutaneous Daily @ 2100    insulin  lispro  0-19 units Subcutaneous TID WC    cefTRIAXone  2,000 mg Intravenous Q24H    doxycycline  IV  100 mg Intravenous 2 times per day                                                                                               Infusions   sodium chloride  100 mL/hr (05/24/24 2124)         Data (Lab, Microbiology, Imaging Results):    Personally reviewed and are significant for :   Recent Results (from the past 48 hours)   EKG 12 lead    Collection Time: 05/24/24 10:04 AM   Result Value Ref Range    Rate 92 bpm    PR 158 ms    P 10 deg    QRSD 92 ms    QT 349 ms    QTc 432 ms    QRS 45 deg    T 132 deg   CBC and differential    Collection Time: 05/24/24 10:27 AM   Result Value Ref Range    WBC 11.5 (H) 3.5 -  11.0 THOU/uL    RBC 4.8 4.0 - 6.0 MIL/uL    Hemoglobin 14.3 12.0 - 17.0 g/dL    Hematocrit 42 37 - 52 %    MCV 86 75 - 100 fL    RDW 13.8 0.0 - 15.0 %    Platelets 145 (L) 150 - 450 THOU/uL    Seg Neut % 83.6 %    Neut # K/uL 9.6 (H) 1.5 - 6.5 THOU/uL    Lymph # K/uL 0.8 (L) 1.0 - 5.0 THOU/uL    Mono # K/uL 0.8 0.1 - 1.0 THOU/uL    Eos # K/uL 0.2 0.0 - 0.5 THOU/uL    Baso # K/uL 0.0 0.0 - 0.2 THOU/uL    Nucl RBC % 0.0 0.0 - 0.2 /100 WBC    Nucl RBC # K/uL 0.0 0.0 - 0.0 THOU/uL  IMM Granulocytes # 0.0 0.0 - 0.0 THOU/uL    IMM Granulocytes 0.3 %   Comprehensive metabolic panel    Collection Time: 05/24/24 10:27 AM   Result Value Ref Range    Sodium 133 133 - 145 mmol/L    Potassium 3.3 3.3 - 4.6 mmol/L    Chloride 98 96 - 108 mmol/L    CO2 19 (L) 20 - 28 mmol/L    Anion Gap 16 7 - 16    UN 13 6 - 20 mg/dL    Creatinine 1.61 0.96 - 1.17 mg/dL    eGFR BY CREAT 93 *    Glucose 205 (H) 60 - 99 mg/dL    Calcium 8.8 8.6 - 04.5 mg/dL    Total Protein 6.9 6.3 - 7.7 g/dL    Albumin 3.5 3.5 - 5.2 g/dL    Bilirubin,Total 1.0 0.0 - 1.2 mg/dL    AST 17 0 - 50 U/L    ALT 19 0 - 50 U/L    Alk Phos 77 40 - 130 U/L   Lactate, plasma    Collection Time: 05/24/24 10:27 AM   Result Value Ref Range    Lactate 0.8 0.5 - 2.2 mmol/L   COVID/Influenza A & B/RSV NAAT (PCR)    Collection Time: 05/24/24 10:28 AM   Result Value Ref Range    COVID-19 Source  Nasopharyngeal     COVID-19 NAAT (PCR) NEGATIVE Negative    Influenza A NAAT (PCR) NEGATIVE Negative    Influenza B NAAT (PCR) NEGATIVE Negative    RSV NAAT (PCR) NEGATIVE Negative   Performing Lab    Collection Time: 05/24/24 10:28 AM   Result Value Ref Range    Performing Lab see below    Blood culture    Collection Time: 05/24/24 10:29 AM    Specimen: Blood: aerobic/anaerobic; L ANTECUBITAL   Result Value Ref Range    Bacterial Blood Culture .    Blood culture    Collection Time: 05/24/24 10:34 AM    Specimen: Blood: aerobic/anaerobic; R ANTECUBITAL   Result Value Ref Range    Bacterial  Blood Culture .    POCT glucose    Collection Time: 05/24/24  1:22 PM   Result Value Ref Range    Glucose POCT 140 (H) 60 - 99 mg/dL   POCT glucose    Collection Time: 05/24/24  4:18 PM   Result Value Ref Range    Glucose POCT 105 (H) 60 - 99 mg/dL   Legionella antigen, urine    Collection Time: 05/24/24  7:49 PM    Specimen: Urine (Clean catch, voided, midstream)   Result Value Ref Range    Legionella Antigen (Urine) Lab Cancel    Strep pneumoniae antigen    Collection Time: 05/24/24  7:49 PM    Specimen: Urine (Clean catch, voided, midstream)   Result Value Ref Range    S. pneumoniae Antigen .    Legionella antigen, urine    Collection Time: 05/24/24  7:49 PM    Specimen: Urine (Clean catch, voided, midstream)   Result Value Ref Range    Legionella Antigen (Urine) .    Basic metabolic panel    Collection Time: 05/25/24  6:06 AM   Result Value Ref Range    Glucose 108 (H) 60 - 99 mg/dL    Sodium 409 811 - 914 mmol/L    Potassium 3.5 3.3 - 4.6 mmol/L    Chloride 109 (H) 96 - 108 mmol/L  CO2 21 20 - 28 mmol/L    Anion Gap 11 7 - 16    UN 13 6 - 20 mg/dL    Creatinine 4.54 0.98 - 1.17 mg/dL    eGFR BY CREAT 119 *    Calcium 8.8 8.6 - 10.2 mg/dL   Magnesium    Collection Time: 05/25/24  6:06 AM   Result Value Ref Range    Magnesium 2.0 1.6 - 2.5 mg/dL   Phosphorus    Collection Time: 05/25/24  6:06 AM   Result Value Ref Range    Phosphorus 2.9 2.7 - 4.5 mg/dL   TSH    Collection Time: 05/25/24  6:06 AM   Result Value Ref Range    TSH 2.25 0.27 - 4.20 uIU/mL   CBC and differential    Collection Time: 05/25/24  6:06 AM   Result Value Ref Range    WBC 8.0 3.5 - 11.0 THOU/uL    RBC 4.5 4.0 - 6.0 MIL/uL    Hemoglobin 13.5 12.0 - 17.0 g/dL    Hematocrit 39 37 - 52 %    MCV 87 75 - 100 fL    RDW 13.6 0.0 - 15.0 %    Platelets 163 150 - 450 THOU/uL    Seg Neut % 64.9 %    Neut # K/uL 5.2 1.5 - 6.5 THOU/uL    Lymph # K/uL 1.6 1.0 - 5.0 THOU/uL    Mono # K/uL 0.8 0.1 - 1.0 THOU/uL    Eos # K/uL 0.4 0.0 - 0.5 THOU/uL    Baso #  K/uL 0.0 0.0 - 0.2 THOU/uL    Nucl RBC % 0.0 0.0 - 0.2 /100 WBC    Nucl RBC # K/uL 0.0 0.0 - 0.0 THOU/uL    IMM Granulocytes # 0.0 0.0 - 0.0 THOU/uL    IMM Granulocytes 0.4 %   POCT glucose    Collection Time: 05/25/24  7:30 AM   Result Value Ref Range    Glucose POCT 107 (H) 60 - 99 mg/dL   POCT glucose    Collection Time: 05/25/24 11:24 AM   Result Value Ref Range    Glucose POCT 143 (H) 60 - 99 mg/dL     EKG 12 lead  Result Date: 05/25/2024  - NORMAL ECG -    Sinus rhythm    CT chest with contrast  Result Date: 05/24/2024  05/24/2024 11:03 AM CT CHEST WITH CONTRAST CLINICAL INFORMATION: worsening cough, pna symptoms, 6 wks. COMPARISON: 07/20/2014. PROCEDURE:  Contiguous images were acquired through the thorax from the thoracic inlet through the upper abdomen.  The patient received iodinated intravenous contrast without immediate reaction.  Automated exposure control, adjustment of the mA and/or kV  according to patient size, and/or iterative reconstruction techniques were utilized for radiation dose optimization.  Amount and type of contrast that was injected and/or discarded is recorded in the electronic medical record. FINDINGS:  Neck base:  Normal. Mediastinum and lymph nodes: Borderline enlarged mediastinal nodes, likely reactive. Cardiovascular structures: Unremarkable Pleura and Pleural space:  Normal.  Airways (Trachea/Bronchi):  The trachea and bronchi are normal.  Lungs: Multifocal patchy airspace and groundglass opacities in the central right upper lobe and throughout the right lower lobe suggesting a multifocal or viral pneumonia. Upper abdomen: Splenomegaly at least 18 cm. Diffusely decreased attenuation of the liver parenchyma, steatosis, suspect hepatomegaly as well. Soft Tissues/Musculoskeletal: No acute or significant abnormalities noted.      Multifocal patchy airspace and groundglass opacities in the  central right upper lobe and throughout the right lower lobe suggesting a multifocal or viral  pneumonia. Splenomegaly and likely hepatomegaly. END OF IMPRESSION             Author:   Adonis Hoot, NP   05/25/2024  7:01 AM

## 2024-05-26 ENCOUNTER — Other Ambulatory Visit: Payer: Self-pay | Admitting: Primary Care

## 2024-05-26 LAB — RENAL FUNCTION PANEL
Albumin: 3.5 g/dL (ref 3.5–5.2)
Anion Gap: 13 (ref 7–16)
CO2: 20 mmol/L (ref 20–28)
Calcium: 9.4 mg/dL (ref 8.6–10.2)
Chloride: 107 mmol/L (ref 96–108)
Creatinine: 0.85 mg/dL (ref 0.67–1.17)
Glucose: 153 mg/dL — ABNORMAL HIGH (ref 60–99)
Lab: 13 mg/dL (ref 6–20)
Phosphorus: 3.3 mg/dL (ref 2.7–4.5)
Potassium: 3.7 mmol/L (ref 3.3–4.6)
Sodium: 140 mmol/L (ref 133–145)
eGFR BY CREAT: 101 *

## 2024-05-26 LAB — POCT GLUCOSE
Glucose POCT: 113 mg/dL — ABNORMAL HIGH (ref 60–99)
Glucose POCT: 162 mg/dL — ABNORMAL HIGH (ref 60–99)
Glucose POCT: 142 mg/dL — ABNORMAL HIGH (ref 60–99)
Glucose POCT: 163 mg/dL — ABNORMAL HIGH (ref 60–99)

## 2024-05-26 LAB — CBC
Hematocrit: 40 % (ref 37–52)
Hemoglobin: 13.8 g/dL (ref 12.0–17.0)
MCV: 86 fL (ref 75–100)
Platelets: 198 10*3/uL (ref 150–450)
RBC: 4.7 MIL/uL (ref 4.0–6.0)
RDW: 13.6 % (ref 0.0–15.0)
WBC: 8.4 10*3/uL (ref 3.5–11.0)

## 2024-05-26 LAB — HEMOGLOBIN A1C: Hemoglobin A1C: 6.3 % — ABNORMAL HIGH

## 2024-05-26 MED ORDER — POLYETHYLENE GLYCOL 3350 PO PACK 17 GM *I*
17.0000 g | PACK | Freq: Every day | ORAL | Status: DC
Start: 2024-05-26 — End: 2024-05-27
  Administered 2024-05-26 – 2024-05-27 (×2): 17 g via ORAL
  Filled 2024-05-26 (×2): qty 17

## 2024-05-26 MED ORDER — SENNOSIDES 8.6 MG PO TABS *I*
2.0000 | ORAL_TABLET | Freq: Every evening | ORAL | Status: DC | PRN
Start: 2024-05-26 — End: 2024-05-27

## 2024-05-26 MED ORDER — POLYETHYLENE GLYCOL 3350 PO PACK 17 GM *I*
17.0000 g | PACK | Freq: Two times a day (BID) | ORAL | Status: DC | PRN
Start: 2024-05-26 — End: 2024-05-27

## 2024-05-26 MED ORDER — ACETAMINOPHEN 325 MG PO TABS *I*
650.0000 mg | ORAL_TABLET | ORAL | Status: DC | PRN
Start: 2024-05-26 — End: 2024-05-27
  Administered 2024-05-26: 650 mg via ORAL
  Filled 2024-05-26: qty 2

## 2024-05-26 MED ORDER — MAGNESIUM HYDROXIDE 400 MG/5ML PO SUSP *I*
30.0000 mL | Freq: Every day | ORAL | Status: DC | PRN
Start: 2024-05-26 — End: 2024-05-27

## 2024-05-26 MED ORDER — SENNOSIDES 8.6 MG PO TABS *I*
1.0000 | ORAL_TABLET | Freq: Every day | ORAL | Status: DC
Start: 2024-05-26 — End: 2024-05-27
  Administered 2024-05-26 – 2024-05-27 (×2): 1 via ORAL
  Filled 2024-05-26 (×2): qty 1

## 2024-05-26 NOTE — Progress Notes (Signed)
 05/26/24 0700   [facility] Patient Class Review   Patient Class Review Inpatient     Patient Class effective 05/24/24

## 2024-05-26 NOTE — Plan of Care (Signed)
 Problem: Safety  Goal: Patient will remain free of falls  Outcome: Maintaining     Problem: Pain/Comfort  Goal: Patient's pain or discomfort is manageable  Outcome: Maintaining     Problem: Nutrition  Goal: Patient's nutritional status is maintained or improved  Outcome: Maintaining     Problem: Mobility  Goal: Patient's functional status is maintained or improved  Outcome: Maintaining

## 2024-05-26 NOTE — Plan of Care (Signed)
 Problem: Safety  Goal: Patient will remain free of falls  05/26/2024 0930 by Laquanda Bick E, RN  Outcome: Maintaining  05/26/2024 0926 by Lonnel Gjerde E, RN  Outcome: Maintaining     Problem: Pain/Comfort  Goal: Patient's pain or discomfort is manageable  05/26/2024 0930 by Amesha Bailey E, RN  Outcome: Maintaining  05/26/2024 0926 by Deneene Tarver E, RN  Outcome: Maintaining     Problem: Nutrition  Goal: Patient's nutritional status is maintained or improved  05/26/2024 0930 by Gunnison Chahal E, RN  Outcome: Maintaining  05/26/2024 0926 by Darnelle Derrick E, RN  Outcome: Maintaining     Problem: Mobility  Goal: Patient's functional status is maintained or improved  05/26/2024 0930 by Aleila Syverson E, RN  Outcome: Maintaining  05/26/2024 0926 by Taje Tondreau E, RN  Outcome: Maintaining

## 2024-05-26 NOTE — Progress Notes (Signed)
 Medical Nutrition Therapy - Initial Assessment    Admit Date: 05/24/2024    Reason for consult: Triggered by Nursing Screening Risk - MST 2  Triggers included 2-13lb weight loss and poor PO    Patient Summary: 58 year old male patient with multiple previous medical history and significant for arthritis, hypertension, and diabetes mellitus type 2. Patient presented to the emergency room 5/25 because of cough     Past Medical History:   Diagnosis Date    Arthritis     knees, hands    Colon polyp     Complication of anesthesia     slow to awaken    Diabetes mellitus     Hypertension      Past Surgical History:   Procedure Laterality Date    COLONOSCOPY      JOINT REPLACEMENT      KNEE SURGERY Bilateral     reconstruction; several surgeries    PR TYMPANOSTOMY GENERAL ANESTHESIA Bilateral 02/21/2024    Procedure: INSERTION, TYMPANOSTOMY TUBE, BILATERAL;  Surgeon: Ponciano Bristle, MD;  Location: [facility]IN OR;  Service: ENT    TONSILLECTOMY          Pertinent Meds: Scheduled Meds:   senna  1 tablet Oral Daily    polyethylene glycol  17 g Oral Daily    guaiFENesin  600 mg Oral 2 times per day    ipratropium-albuterol   3 mL Nebulization 4x Daily    doxycycline   100 mg Oral 2 times per day    losartan   50 mg Oral QPM    enoxaparin  40 mg Subcutaneous Daily @ 2100    insulin  lispro  0-19 units Subcutaneous TID WC    cefTRIAXone  2,000 mg Intravenous Q24H     Continuous Infusions:  PRN Meds:.senna, polyethylene glycol, magnesium hydroxide, acetaminophen , benzonatate, albuterol , labetalol, ondansetron , melatonin, Juice, dextrose, dextrose, glucagon, guaiFENesin-codeine    Pertinent Labs:       Lab results: 05/26/24  0510 05/25/24  0606 05/24/24  1027   Sodium 140   < > 133   Potassium 3.7   < > 3.3   Chloride 107   < > 98   CO2 20   < > 19*   UN 13   < > 13   Creatinine 0.85   < > 0.95   Glucose 153*   < > 205*   Calcium 9.4   < > 8.8   Total Protein  --   --  6.9   Albumin 3.5  --  3.5   ALT  --   --  19   AST  --   --  17   Alk  Phos  --   --  77   Bilirubin,Total  --   --  1.0    < > = values in this interval not displayed.     Food allergies: NKFA    Nutrition Hx: Pt reports decreased PO x6 weeks in view of acute illness (coughing). Reports he has decreased sense of taste, and has been eating ~50% less of what he usually consumes. Denies N/V. Endorses bouts of constipation over the last 6 months, diarrhea on Thursday and 1 BM since. States he received bowel regimen this AM. Pt agreeable to try chocolate Glucerna daily.     Current diet:        Dietary Orders   (From admission, onward)                 Start  Ordered    05/24/24 1134  Diet Consistent Carbohydrate  DIET EFFECTIVE NOW               05/24/24 1138                    Current Intake: Chart documentation shows pt consuming 75-100% although noted pt ordering 1-3 items per tray and half portion entrees. Wife providing additional meals/snacks as desired (cookies, wraps, fruit)    Nutrition Focused Physical Exam:  Adequate muscle/fat stores  Edema: None  Skin: Intact    Anthropometrics:  Height: 188 cm (6\' 2" )    Current Weight: 113.3 kg (249 lb 12.8 oz)  Ideal Body Weight: 86 kg + 10%  Body mass index is 32.07 kg/m. High for age  Weight Hx:  Reports UBW ~255# stated he weighed himself day prior to admission and was 247#. Endorses this weight loss over 6 weeks.       Estimated Nutrient Needs: (Based on 86 kg - IBW)    2150-2580 kcal/day (25-30kcal/kg)   129172 g protein/day (1.5-2.0g/kg)    2150-2580 mL fluid/day (25-30 mL/kg)      Nutrition Assessment and Diagnosis:   Inadequate oral intake related to current medical condition as evidenced by consuming <75% EN >1 mo    Malnutrition Status: Probable mild malnutrition in view of meeting <75% EN >7 days      Current literature does not support serum albumin and prealbumin as indicators of malnutrition as these proteins are indicative of inflammation and infection. Albumin and prealbumin do not change in response to changes in  nutrient intake or nitrogen balance in the setting of inflammation. ASPEN and the Academy of Nutrition & Dietetics (AND) malnutrition criteria is based on energy intake, weight loss, muscle and body fat losses.      Nutrition Intervention:   Consistent CHO diet  Glucerna daily (Provides 180kcal, 9g fat, 16g CHO, 10g protein per serving)- prefs chocolate    Nutrition Monitoring/Evaluation:   Will monitor diet tolerance and intake, nutrition-related labs, weight trend, BM pattern.  Nutrition to follow up per low nutrition risk protocol.    Lizzie Riis MS, RD, CDN  [phone]

## 2024-05-26 NOTE — Provider Consult (Signed)
 Pulmonary / CCM Consult Note  Hospital Day: 2    Asked to see patient by:       Attending: Tsosie Gail, MD   PCP: Noah Lurie, MD   Admit Date:  05/24/2024     Regarding: Cough shortness of breath abnormal CT    History of Present Illness:     Noah Craig is a 58 y.o.male admitted on 05/24/2024 with recurrent respiratory discomfort after being treated for community-acquired pneumonia as an outpatient.    [name] has no particular respiratory history.  He had a couple week course of feeling sick with cold-like symptoms that began to abate until they suddenly worsened.  Therefore he went to his primary care doctor had some loose stools but increasing respiratory symptoms including cough and mucus production therefore was treated with 7-day course of Augmentin .  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 cough mucus production shortness of breath which led to chills and fatigue finally insisted by his wife to go back to the hospital he was found to have have lupus ptosis abnormal chest x-ray and was admitted to the hospital for what was described as a failure of outpatient antibiotic therapy he had a few nights with increased cough and mucus production but overall feels better today and notes that he is having decreasing sputum production slight fatigue but no distress    He had some stomach upset some intermittent most loose stools but no belly tenderness no skin rash no focal neurologic difficulties subtle lower extremity puffiness but no overt fluid retention.  No knowledge of renal or other difficulty    Past Medical and Surgical History     Past Medical History:   Diagnosis Date    Arthritis     knees, hands    Colon polyp     Complication of anesthesia     slow to awaken    Diabetes mellitus     Hypertension      Past Surgical History:   Procedure Laterality Date     COLONOSCOPY      JOINT REPLACEMENT      KNEE SURGERY Bilateral     reconstruction; several surgeries    PR TYMPANOSTOMY GENERAL ANESTHESIA Bilateral 02/21/2024    Procedure: INSERTION, TYMPANOSTOMY TUBE, BILATERAL;  Surgeon: Noah Craig, Srinivas, MD;  Location: [facility] OR;  Service: ENT    TONSILLECTOMY         Medications and Allergies     Medications Prior to Admission   Medication Sig    fluticasone  (FLONASE ) 50 MCG/ACT nasal spray Spray 2 sprays into each nostril daily.    benzonatate (TESSALON) 100 mg capsule Take 1 capsule (100 mg total) by mouth 3 times daily as needed.  Swallow whole. Do not crush or chew.    insulin  lispro (HUMALOG  KWIKPEN) 100 UNIT/ML injection pen inject 10 units with breakfast then 15 units with LUNCH and 15 units with dinner    metFORMIN  (GLUCOPHAGE -XR) 500 mg 24 hr tablet take 1 tablets by mouth every morning and 2 tablet every evening    semaglutide , 1 mg/dose, (OZEMPIC ) 4 MG/3ML pen Inject 1 mg into the skin once a week.    JARDIANCE  25 MG tablet take 1 tablet by mouth every morning    losartan  (COZAAR ) 25 mg tablet take 1 tablet by mouth once daily    naproxen sodium (ANAPROX) 220 MG tablet  Take 1 tablet (220 mg total) by mouth 2 times daily (with meals). Pt takes as needed    Continuous Glucose Sensor (DEXCOM G7 SENSOR) use as directed , CHANGE EVERY 10 DAYS    insulin  pen needle (DROPLET PEN NEEDLES) 31G X 6 MM use 1 PEN NEEDLE to inject MEDICATION subcutaneously three times a day as directed    Continuous Blood Gluc Receiver (DEXCOM G7 RECEIVER) DEVI By 1 each no specified route continuously     Current Facility-Administered Medications   Medication Dose Route Frequency    senna  1 tablet Oral Daily    polyethylene glycol  17 g Oral Daily    guaiFENesin  600 mg Oral 2 times per day    ipratropium-albuterol   3 mL Nebulization 4x Daily    doxycycline   100 mg Oral 2 times per day    losartan   50 mg Oral QPM    enoxaparin  40 mg Subcutaneous Daily @ 2100    insulin  lispro  0-19 units  Subcutaneous TID WC    cefTRIAXone  2,000 mg Intravenous Q24H       ALLERGIES: is allergic to lisinopril , victoza  [liraglutide ], and environmental allergies.    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  He is a former smoker never with respiratory history of difficulty    Review of Systems:  Negative otherwise as above per HPI.  A 12 system review was confronted    Physical Examination:  VS: BP: (161-222)/(64-106)   Temp:  [36.1 C (97 F)-36.8 C (98.2 F)]   Temp src: Infrared (05/27 0954)  Heart Rate:  [66-99]   Resp:  [16-20]   SpO2:  [92 %-96 %]   I/O last 3 completed shifts:  05/26 0700 - 05/27 0659  In: 2821.7 (24.9 mL/kg) [P.O.:1320; I.V.:1501.7 (0.6 mL/kg/hr)]  Out: 900 (7.9 mL/kg) [Urine:900 (0.3 mL/kg/hr)]  Net: 1921.7  Weight: 113.3 kg     GEN: Alert conversational laying nearly completely flat appropriate interactive pleasant occasional dry nonproductive cough wife sitting at the bedside  HEENT: PE RTL OP dry without ulcerationLesion  PULM: Few crackles at the right lower lobe otherwise easy work of breathing no expiratory wheeze or rhonchi no dullness to percussion  CV: Reg no MRG, flat jvp trace to no edema  ABD: Belly soft nt nd no rebound or guarding + BS  EXT: WWP no acute skin lesions no erythema cyanosis or clubbing.  NEURO: Intact globally without focal deficit of motor or sensory or cognitive function      Lab Results:   Recent Labs   Lab 05/26/24  0510 05/25/24  0606 05/24/24  1027   WBC 8.4 8.0 11.5*   Hemoglobin 13.8 13.5 14.3   Hematocrit 40 39 42   Platelets 198 163 145*        No results for input(s): "ABE", "AOSAT", "APCO2", "APO2", "APH" in the last 168 hours.       Recent Labs   Lab 05/26/24  0510 05/25/24  0606 05/24/24  1027   Sodium 140 141 133   Potassium 3.7 3.5  3.3   Chloride 107 109* 98   CO2 20 21 19*   Anion Gap 13 11 16    UN 13 13 13  Creatinine 0.85 0.84 0.95   Glucose 153* 108* 205*   Calcium 9.4 8.8 8.8   Albumin 3.5  --  3.5   Phosphorus 3.3 2.9  --    Magnesium  --  2.0  --           EKG 12 lead  Result Date: 05/25/2024  Sinus rhythm    CT chest with contrast  Result Date: 05/24/2024  Multifocal patchy airspace and groundglass opacities in the central right upper lobe and throughout the right lower lobe suggesting a multifocal or viral pneumonia. Splenomegaly and likely hepatomegaly. END OF IMPRESSION         Micro:  Sputum Gram stain here with purulence but no visible organisms no growth to date    COVID-19 Lab Results:  Lab Results   Component Value Date/Time    COVID-19 NAAT (PCR) NEGATIVE 05/24/2024 1028    COVID-19 NAAT (PCR) NEGATIVE 05/22/2024 2009    COVID-19 NAAT (PCR) NEGATIVE 05/06/2024 1530    COVID-19 NAAT (PCR) POS (!) 12/22/2019 1311       Aerobic Culture   Date Value Ref Range Status   05/25/2024 .  Preliminary     Bacterial Blood Culture   Date Value Ref Range Status   05/24/2024 .  Preliminary   05/24/2024 .  Preliminary         Imaging:  EKG 12 lead  Result Date: 05/25/2024  Sinus rhythm    CT chest with contrast  Result Date: 05/24/2024  Multifocal patchy airspace and groundglass opacities in the central right upper lobe and throughout the right lower lobe suggesting a multifocal or viral pneumonia. Splenomegaly and likely hepatomegaly. END OF IMPRESSION       EKG:   Results for orders placed or performed during the hospital encounter of 05/24/24   EKG 12 lead   Result Value Ref Range Status    Rate 92 bpm Final    PR 158 ms Final    P 10 deg Final    QRSD 92 ms Final    QT 349 ms Final    QTc 432 ms Final    QRS 45 deg Final    T 132 deg Final    Narrative    - NORMAL ECG -    Impression    Sinus rhythm       Echo:      EXERCISE STRESS ECHO COMPLETE 02/14/2021    Interpretation Summary   The left ventricular ejection fraction is normal. The  calculated LVEF is 65%. There are no focal regional wall motion abnormalities.   Mild left atrial enlargement.   No significant valve disease.   Sinus rhythm with no ischemic ECG changes with stress.   Negative stress echocardiogram, no evidence of ischemia.      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  He did not have a defined viral infection but by clinical history and the fact that he works as a Designer, industrial/product the pretest probability is quite high.  He was treated with outpatient Augmentin  but had recurrence of symptoms shortly thereafter and so now is on treatment course with ceftriaxone doxycycline .  Sputum analysis demonstrates a purulent sample without visible organism in the chest CT demonstrates essentially a lobar right lower lobe pneumonia with subtle impact on the right upper lobe medially.  There is no endobronchial obstruction likely his diabetes and age plays a slight role in recrudescence of symptoms.    Recommendations:      Finish full course of cephalosporin with doxycycline  - 7d.  Would be appropriate to switch to oral therapy tomorrow and even consider discharge in the next 12-24 hours.    There currently is no strong argument for underlying  resistant organism or further in-depth workup    Will be reasonable for primary care follow-up and I discussed with him that he may have a few weeks of cough and fatigue that should progressively improve but that he does not at this point require further pulmonary follow-up at this time but I would be happy to help him questions in the future arise          Conni Deis. Kathalene Pali M.D.     Pulmonary Critical Care Division   [facility]      05/26/2024  11:01 AM           [1]   Social History  Socioeconomic History    Marital status: Married   Tobacco Use    Smoking status: Former     Packs/day: 1.00     Years: 10.00     Additional pack years: 0.00     Total pack years: 10.00     Types: Cigarettes     Quit date: 1992     Years since quitting: 33.4    Smokeless tobacco: Former     Quit date: 01/01/1992   Substance and Sexual Activity    Alcohol use: Yes     Comment:  monthly    Drug use: No

## 2024-05-26 NOTE — Telephone Encounter (Signed)
 Last seen 05-05-24 and no future apt. A1c, BMP 5/25, lipids 12/24.

## 2024-05-26 NOTE — Progress Notes (Signed)
 Daily Progress Note  Hospital Medicine      Patient: Noah Craig   [id number]  Date of Birth: 1966/02/07  MRN#: [id number]    LOS: Inpatient 2 days.   Admit Date: 05/24/2024   Date of Service: 05/26/2024    Admitting Provider: Zella Hidalgo, MD   Primary Care Physician: Hollis Lurie, MD     Assessment and Plan:    Nivek Powley is a 58 y.o. male. PMH arthritis NOS (OA?) , HTN, NIDDM2, chronic OM w/ TM tubes (per ENT 04/03/2024).  Patient presented to the emergency room 05/24/2024 w/ acute rigors/sweats and 6w cough that had failed empiric Augmentin  x 7d.  Initial workup done at the ED showed a CBC with a WBC count of 11.5,plt of 145K.  CO2 at 19 with normal liver enzymes.  His lactate is normal at 0.8, while his viral swabs including COVID are negative.  His EKG shows sinus rhythm.  CT of the chest  showing multilobar pneumonia       Multifocal-multilobar right-sided Pneumonia, POA, w/o hypoxia  - Failed 1w outpatient Augmentin , continued cough, progressive rigors  - CT at admission = multifocal right sided pneumonia  - Ceftriaxone  and doxycyline from admission  - duoneb and prn albuterol , mucinex  and guifensien with codiene, incentive spirometer   - remote history of smoking having some exp wheezes; started prednisone  burst 05/25/2024, stopped 05/26/2024 in absence of dx of COPDE and to help assess response to abxs  - viral panel + extend viral panel = NEG  - legionella, strep pneumonia neg  - sputum Gram stain NEG  - procal 0.18, Per UTD, Procal may have utility for deciding whether/when to de-escalate/stop abx treatment of PNA but lacks utility for determining whether to start.   - Informal d/w PULM (JW) 05/26/2024 endorsed current diagnosis and plan of care.     IDDM2  - Home jardiance , metformian ozempic  held at admission  - Uses 40u TDD humalog  w/ meals at home per Med Rec but BG's < 200 here on Correctional insulin  MOD scale w/o boluses and TDD ~10u Humalog .   - last A1c 12/24 6.4. Recheck 05/27/2024  PENDING     Hypertension, Uncontrolled  Headache  - bp> 200 labetolol 10 mg x 1 given 05/25/2024, placed on telemetry until 05/26/2024.   - Increased home losartan  25mg  to 50 mg, consider further increase to 100mg   - per patient bp is 150s baseline in the doctor office         Medication Reconciliation by HMD: Yes 05/26/2024     Significant home medications held and not replaced as of 05/26/2024:   - Home jardiance , metformian ozempic  held at admission  - Uses 40u TDD humalog  w/ meals at home per Med Rec    BM since yesterday morning: N, PTA. Laxatives increased 05/26/2024     Allowed to shower? Y    Foley: N/A      Tele: Off 05/26/2024     Lines other than PIV: N/A     IV Fluids running: N       Current diet: Diet Consistent Carbohydrate      Smoking Cessation: NA Distant tob.     Family last updated:  TBA     Code Status/GOC:  FULL from admit     DVT prophylaxis: Padua score >3? Y .  LMWH ppx     Consults: N/A     Mobility: Ind    Disposition planning, and barriers: Home when resp status sufficiently improved   (  Pre-admit location: Home. Pre-admit mobility: Ind.)    Estimated date medically ready for discharge: 05/28/2024      Plan and orders for day reviewed with nursing? Yes.      CURRENT MEDICATIONS:    Scheduled Meds:   guaiFENesin   600 mg Oral 2 times per day    ipratropium-albuterol   3 mL Nebulization 4x Daily    predniSONE   40 mg Oral Daily    doxycycline   100 mg Oral 2 times per day    losartan   50 mg Oral QPM    enoxaparin   40 mg Subcutaneous Daily @ 2100    insulin  lispro  0-19 units Subcutaneous TID WC    cefTRIAXone   2,000 mg Intravenous Q24H     Continuous Infusions:  PRN Meds:.benzonatate , albuterol , labetalol , polyethylene glycol, ondansetron , melatonin, Juice, dextrose , dextrose , glucagon , guaiFENesin -codeine        ROS and Interval History    Felt better 05/25/2024 shortly after abx and steroid, 05/26/2024 feels as bad as at admission. Cough persists, DOE/SOB, little appetite, no nausea.     VITAL SIGNS:      24 Hr Vitals Ranges:    BP: (161-222)/(69-106)   Temp:  [36.1 C (97 F)-36.8 C (98.2 F)]   Temp src: Infrared (05/27 0508)  Heart Rate:  [66-99]   Resp:  [16-20]   SpO2:  [91 %-96 %]      Most recent vitals:    BP 165/87 (BP Location: Left arm)   Pulse 66   Temp 36.4 C (97.5 F) (Infrared)   Resp 16   Ht 1.88 m (6\' 2" )   Wt 113.3 kg (249 lb 12.8 oz)   SpO2 96%   BMI 32.07 kg/m       Intake/Output Summary (Last 24 hours) at 05/26/2024 1610  Last data filed at 05/25/2024 1637  Gross per 24 hour   Intake 2821.67 ml   Output 900 ml   Net 1921.67 ml       I/O last 3 completed shifts:  05/25 2300 - 05/26 2259  In: 2821.7 (24.9 mL/kg) [P.O.:1320; I.V.:1501.7 (0.6 mL/kg/hr)]  Out: 1800 (15.9 mL/kg) [Urine:1800 (0.7 mL/kg/hr)]  Net: 1021.7  Weight: 113.3 kg       PHYSICAL EXAM    Constitutional: WNWD but Mildly-modly ill appearing -- Body mass index is 32.07 kg/m.  Eyes: Sclerae anicteric, conjunctivae pink, PER.  Ears, Nose, Mouth, and Throat: Moist, no gross lesions.  Cardiovascular: Regular rate, regular rhythm  Respiratory: CTA bilaterally, no rales, no wheezes, no dyspnea, no egophony, no focal dullness to percussion  Gastrointestinal: Nontender (including RUQ, Epig, LLQ), soft, nondistended, normal BS  Musculoskeletal: No focality  Extremities: Calves supple, no lower leg pitting edema  Skin: Warm, dry, no generalized rash  Neurologic: Alert, face symmetric, EOMI, speech fluent, no focal motor deficits  Psychiatric: Ill affect, speech normal in context and clarity, attention good, no obvious alteration in level of consciousness     LABS:    I reviewed routine lab and radiography reports for 05/26/2024 at the time of patient rounding. Pertinent new findings include the following:    05/26/2024:  BMP/CBC    Further labs MWF    Blood glucoses      Lab results: 05/26/24  0510 05/25/24  2117 05/25/24  1645 05/25/24  1124 05/25/24  0730 05/25/24  0606 05/24/24  1618 05/24/24  1322 05/24/24  1027 02/21/24  1007  12/11/23  1414   Glucose 153*  --   --   --   --  108*  --   --  205*  --  85   Glucose POCT  --  198* 159* 143* 107*  --  105*   < >  --    < >  --     < > = values in this interval not displayed.       Lab Results   Component Value Date    HA1C 6.5 (H) 12/11/2023       OTHER: N/A          Author: Tsosie Gail, MD  as of: 05/26/2024  at: 6:21 AM

## 2024-05-27 ENCOUNTER — Other Ambulatory Visit: Payer: Self-pay

## 2024-05-27 LAB — BASIC METABOLIC PANEL
Anion Gap: 12 (ref 7–16)
CO2: 23 mmol/L (ref 20–28)
Calcium: 8.9 mg/dL (ref 8.6–10.2)
Chloride: 105 mmol/L (ref 96–108)
Creatinine: 0.84 mg/dL (ref 0.67–1.17)
Glucose: 133 mg/dL — ABNORMAL HIGH (ref 60–99)
Lab: 11 mg/dL (ref 6–20)
Potassium: 3.4 mmol/L (ref 3.3–4.6)
Sodium: 140 mmol/L (ref 133–145)
eGFR BY CREAT: 102 *

## 2024-05-27 LAB — POCT GLUCOSE: Glucose POCT: 128 mg/dL — ABNORMAL HIGH (ref 60–99)

## 2024-05-27 LAB — HEMOGLOBIN A1C: Hemoglobin A1C: 6.4 % — ABNORMAL HIGH

## 2024-05-27 LAB — CBC
Hematocrit: 39 % (ref 37–52)
Hemoglobin: 13.3 g/dL (ref 12.0–17.0)
MCV: 86 fL (ref 75–100)
Platelets: 197 10*3/uL (ref 150–450)
RBC: 4.6 MIL/uL (ref 4.0–6.0)
RDW: 13.3 % (ref 0.0–15.0)
WBC: 6.7 10*3/uL (ref 3.5–11.0)

## 2024-05-27 LAB — AEROBIC CULTURE: Aerobic Culture: 0

## 2024-05-27 MED ORDER — DOXYCYCLINE HYCLATE 100 MG PO TABS *I*
100.0000 mg | ORAL_TABLET | Freq: Two times a day (BID) | ORAL | 0 refills | Status: AC
Start: 2024-05-27 — End: 2024-05-31
  Filled 2024-05-27: qty 8, 4d supply, fill #0

## 2024-05-27 MED ORDER — AMOXICILLIN-POT CLAVULANATE 875-125 MG PO TABS *I*
1.0000 | ORAL_TABLET | Freq: Two times a day (BID) | ORAL | 0 refills | Status: AC
Start: 2024-05-28 — End: 2024-05-31
  Filled 2024-05-27: qty 6, 3d supply, fill #0

## 2024-05-27 NOTE — Plan of Care (Signed)
 Problem: Safety  Goal: Patient will remain free of falls  Outcome: Maintaining     Problem: Pain/Comfort  Goal: Patient's pain or discomfort is manageable  Outcome: Maintaining     Problem: Nutrition  Goal: Patient's nutritional status is maintained or improved  Outcome: Maintaining     Problem: Mobility  Goal: Patient's functional status is maintained or improved  Outcome: Maintaining     Problem: Psychosocial  Goal: Demonstrates ability to cope with illness  Outcome: Maintaining     Problem: Cognitive function  Goal: Cognitive function will be maintained or return to baseline  Description: Interventions:  Delirium Assessment  LIVEBAR Assessment    Outcome: Maintaining  Goal: Lines and tethers will be removed as appropriate  Outcome: Maintaining  Goal: Patient maintains appropriate nutritional intake  Outcome: Maintaining  Goal: Vital signs will be within normal limits  Outcome: Maintaining  Goal: Evidence for potential causes of delirium will be managed  Outcome: Maintaining  Goal: Behaviors will return to baseline  Outcome: Maintaining  Goal: Ambulation and mobility will be maintained  Outcome: Maintaining  Goal: Retention of urine and constipation will be managed  Outcome: Maintaining     Problem: Risk for Impaired Sleep/Wake Cycle  Goal: The patient will maintain an adequate sleep/wake cycle  Outcome: Maintaining

## 2024-05-27 NOTE — Discharge Instructions (Addendum)
*  PLEASE BRING THIS DISCHARGE PACKET (RED FOLDER) WITH YOU TO YOUR NEXT PCP FOLLOW-UP *    Activity/work restrictions / Weight bearing: As tolerated     Diet: Diet Consistent Carbohydrate or as tolerated     Follow up needed (prioritized):   We will contact your PCP to try to arrange post-hospital follow up. If we are unable to do so, please reach out yourself to your PCP to request follow up to occur within the next week.      Medications stopped, started or changed: See AVS and below    Wound/skin care: N/A      May return to program: N/A      Particular warning signs for which you should contact your physician promptly: Routine concerns, worsening respiratory symptoms.     Special Instructions, why you were here and what we did for you here:    You presented with ongoing cough and trouble breathing despite a week of Augmentin . The pulmonologist Dr. 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 Dr. Kathalene Pali discussed with you that you may have a few weeks more of cough and fatigue that should progressively improve but at this point you not require further Pulmonology follow-up. During your stay you received 4 days of an IV antibiotic and 3.5 days of an oral antibiotic; we are continuing oral antibiotics (Augmentin , again, and Doxycycline ) per Dr. Argyle Kurk recommendation to complete a 7 day total course of antibiotics. For your convenience the [facility] will deliver these medications to your bedside before you leave. If it has not been delivered to your bed by the time of your discharge it will be ready for you across the street at the [facility] drive-through window.     Your blood pressure was elevated here but not so alarmingly that we are changing your medications at discharge. Please discuss with your PCP further blood pressure  control.     Our Nutritionist evaluated you and found mild protein-calorie malnutrition in setting of weight loss from your current illness. Be mindful to have a good, protein-rich diet and discuss with your PCP if your appetite does not return to normal in the next 1-2 weeks.

## 2024-05-27 NOTE — Discharge Summary (Signed)
 Name: Noah Craig MRN: [id number] DOB: [birth year]     Admit Date: 05/24/2024   Date of Discharge: 05/27/2024     Patient was accepted for discharge to          Discharge Attending Physician: Tsosie Gail, MD      Hospitalization Summary    Concise Narrative:   Discharge Instructions       Activity/work restrictions / Weight bearing: As tolerated     Diet: Diet Consistent Carbohydrate or as tolerated     Follow up needed (prioritized):   We will contact your PCP to try to arrange post-hospital follow up. If we are unable to do so, please reach out yourself to your PCP to request follow up to occur within the next week.      Medications stopped, started or changed: See AVS and below    Wound/skin care: N/A      May return to program: N/A      Particular warning signs for which you should contact your physician promptly: Routine concerns, worsening respiratory symptoms.     Special Instructions, why you were here and what we did for you here:    You presented with ongoing cough and trouble breathing despite a week of Augmentin . The pulmonologist Dr. 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 Dr. Kathalene Pali discussed with you that you may have a few weeks more of cough and fatigue that should progressively improve but at this point you not require further Pulmonology follow-up. During your stay you received 4 days of an IV antibiotic and 3.5 days of an oral antibiotic; we are continuing oral antibiotics (Augmentin , again, and Doxycycline ) per Dr. Argyle Kurk recommendation to complete a 7 day total course of antibiotics. For your convenience the [facility] will deliver these medications to your bedside before you leave. If it has not been delivered to your bed by the time of your discharge it will be ready for you across the street at the [facility]  drive-through window.     Your blood pressure was elevated here but not so alarmingly that we are changing your medications at discharge. Please discuss with your PCP further blood pressure control.     Our Nutritionist evaluated you and found mild protein-calorie malnutrition in setting of weight loss from your current illness. Be mindful to have a good, protein-rich diet and discuss with your PCP if your appetite does not return to normal in the next 1-2 weeks.         Disposition: home    Disposition-[place]: home with no assistance    GOC modified this stay? N Primary care notified of the change? NA    Primary care was/will be contacted at discharge? N    Have I sought feedback today from the patient on this hospitalization? Pleased     Have I spoken with the patient's family today? Wife at bedside 05/26/2024, 05/27/2024.     How likely in my opinion is 30-day readmission (scale 1-5)?  2. He should do well but may have ongoing symptoms.       Primary Discharge Diagnoses:  CAP, POA    Secondary Diagnoses:  IDDM2    Hospital Course:   Noah Craig is a 58 y.o. male. PMH arthritis NOS (OA?) , HTN, NIDDM2, chronic OM w/ TM tubes (per ENT 04/03/2024).  Patient presented  to the emergency room 05/24/2024 w/ acute rigors/sweats and 6w cough that had failed empiric Augmentin  x 7d.  Initial workup done at the ED showed a CBC with a WBC count of 11.5,plt of 145K.  CO2 at 19 with normal liver enzymes.  His lactate is normal at 0.8, while his viral swabs including COVID are negative.  His EKG shows sinus rhythm.  CT of the chest showed RLL pneumonia with some extension to adjacent lobes.       Multifocal-multilobar right-sided Pneumonia, POA, w/o hypoxia  - May have failed 1w outpatient Augmentin , or may have had viral PNA at that time which then developed bacterial superinfection.   - Presented with continued cough, progressive rigors  - CT at admission = multifocal right sided pneumonia  - Ceftriaxone and doxycyline from  admission, to Augmentin  and Doxy at discharge to complete 7d course per PULM formal consultation.   - duoneb and prn albuterol , mucinex and guifensien with codiene, incentive spirometer   - remote history of smoking having some exp wheezes; started prednisone burst 05/25/2024, stopped 05/26/2024 in absence of dx of COPDE and to help assess response to abxs  - viral panel + extend viral panel = NEG  - legionella, strep pneumonia neg  - sputum Gram stain NEG  - procal 0.18, Per UTD, Procal may have utility for deciding whether/when to de-escalate/stop abx treatment of PNA but lacks utility for determining whether to start.      IDDM2  - Home jardiance , metformian ozempic  held at admission  - Uses 40u TDD humalog  w/ meals at home per Med Rec but BG's < 200 here on Correctional insulin  MOD scale w/o boluses and TDD ~10u Humalog .   - last A1c 12/24 6.4. Recheck 05/27/2024 PENDING at time of discharge.      Hypertension, Uncontrolled, POA, resolved  Headache, POA, resolved.   - bp> 200 labetolol 10 mg x 1 given 05/25/2024, placed on telemetry until 05/26/2024.   - Increased home losartan  25mg  to 50 mg, inpatient. Not changed at discharge, as hospitalists are discouraged from acute changes in BP medications that are not strictly necessary. Can follow-up outpatient. - per patient bp is 150s baseline in the doctor office     Mild protein-calorie malnutrition, POA  - Felt probable by RD on consultation, related to current extended illness.  - No specific recommendations.       Consults: Pulmonology    Last documented BM: PTA    Discharge Exam:    BP: (154-181)/(64-92)   Temp:  [36.2 C (97.2 F)-36.7 C (98.1 F)]   Temp src: Infrared (05/28 0459)  Heart Rate:  [66-76]   Resp:  [16-18]   SpO2:  [93 %-96 %]     Cough persists and perhaps worsened. SOB improved when not coughing. No nausea. No BM this stay    Constitutional: WNWD but Mildly-modly ill appearing -- Body mass index is 32.07 kg/m.  Eyes: Sclerae anicteric, conjunctivae  pink, PER.  Ears, Nose, Mouth, and Throat: Moist, no gross lesions.  Cardiovascular: Regular rate, regular rhythm  Respiratory: CTA bilaterally, no rales, no wheezes, no dyspnea, no egophony, no focal dullness to percussion  Gastrointestinal: Nontender (including RUQ, Epig, LLQ), soft, nondistended, normal BS  Musculoskeletal: No focality  Extremities: Calves supple, no lower leg pitting edema  Skin: Warm, dry, no generalized rash  Neurologic: Alert, face symmetric, EOMI, speech fluent, no focal motor deficits  Psychiatric: Ill affect, speech normal in context and clarity, attention good, no obvious alteration  in level of consciousness       Radiology:    EKG 12 lead  Result Date: 05/25/2024  Sinus rhythm    CT chest with contrast  Result Date: 05/24/2024  Multifocal patchy airspace and groundglass opacities in the central right upper lobe and throughout the right lower lobe suggesting a multifocal or viral pneumonia. Splenomegaly and likely hepatomegaly. END OF IMPRESSION     ===   ===    EXERCISE STRESS ECHO COMPLETE 02/14/2021    - Interpretation Summary -   The left ventricular ejection fraction is normal. The calculated LVEF is 65%. There are no focal regional wall motion abnormalities.   Mild left atrial enlargement.   No significant valve disease.   Sinus rhythm with no ischemic ECG changes with stress.   Negative stress echocardiogram, no evidence of ischemia.    Labs:    BMP/Electrolytes CBC                  05/27/24     05/26/24     05/25/24     05/24/24                       0504          0510          0606          1027          Sodium       140          140          141          133           Potassium    3.4          3.7          3.5          3.3           Chloride     105          107          109*         98            CO2          23           20           21            19*           UN           11           13           13           13             Creatinine   0.84         0.85         0.84          0.95          Glucose      133*         153*         108*         205*          Calcium      8.9          9.4  8.8          8.8           Magnesium     --           --          2.0           --           Albumin       --          3.5           --          3.5           Phosphorus    --          3.3          2.9           --           Estimated Creatinine Clearance: 131.39 mL/min (based on SCr of 0.84 mg/dL).                     05/27/24     05/26/24     05/25/24     05/24/24                       0504          0510          0606          1027          WBC          6.7          8.4          8.0          11.5*         Hemoglobin   13.3         13.8         13.5         14.3          Hematocrit   39           40           39           42            Platelets    197          198          163          145*          MCV          86           86           87           86            RDW          13.3         13.6         13.6         13.8          Seg Neut %    --           --          64.9         83.6  Liver Function Cardiac Studies                  05/26/24     05/24/24                       0510          1027          AST           --          17            ALT           --          19            Alk Phos      --          77            Total Prote*  --          6.9           Albumin      3.5          3.5           Bilirubin,T*  --          1.0              Lab results:    05/25/24     05/24/24                       0606          1027          TROP T RAND* 8            10            No results for input(s): "PROBNP" in the last 72 hours.        Coagulation Other  No results for input(s): "PTI", "INR" in the last 8760 hours.  No results for input(s): "PTT" in the last 8760 hours.     No results for input(s): "CRP", "ESR" in the last 72 hours.  Lab             05/24/24                       1027          Lactate      0.8                Laboratory Tests Pending Results     Order Current Status     Hemoglobin A1c In process    Aerobic culture Preliminary result    Blood culture Preliminary result    Blood culture Preliminary result    Legionella culture Preliminary result         Patient Instructions:   Current Discharge Medication List    START taking these medications    doxycycline  hyclate (VIBRA -TABS) 100 mg tablet  Take 1 tablet (100 mg total) by mouth every 12 hours for 4 days for Community Acquired Pneumonia.  Qty: 8 tablet Refills: 0    amoxicillin -clavulanate (AUGMENTIN ) 875-125 mg tablet  Take 1 tablet by mouth every 12 hours for 3 days for Community Acquired Pneumonia.  Qty: 6 tablet Refills: 0  Comments: Patient discharged from unit FFT 3 WEST        CONTINUE these medications  which have NOT CHANGED    fluticasone  (FLONASE ) 50 MCG/ACT nasal spray  Spray 2 sprays into each nostril daily.    benzonatate  (TESSALON ) 100 mg capsule  Take 1 capsule (100 mg total) by mouth 3 times daily as needed.  Swallow whole. Do not crush or chew.  Qty: 30 capsule Refills: 0  Associated Diagnoses:Acute cough    insulin  lispro (HUMALOG  KWIKPEN) 100 UNIT/ML injection pen  inject 10 units with breakfast then 15 units with LUNCH and 15 units with dinner  Qty: 42 mL Refills: 1  Comments: Please dispense whichever insulin  lispro product (Humalog , Admelog , or generic) is covered by patient insurance    metFORMIN  (GLUCOPHAGE -XR) 500 mg 24 hr tablet  take 1 tablets by mouth every morning and 2 tablet every evening  Qty: 270 tablet Refills: 1    semaglutide , 1 mg/dose, (OZEMPIC ) 4 MG/3ML pen  Inject 1 mg into the skin once a week.  Qty: 3 mL Refills: 2    naproxen sodium (ANAPROX) 220 MG tablet  Take 1 tablet (220 mg total) by mouth 2 times daily (with meals). Pt takes as needed    JARDIANCE  25 MG tablet  take 1 tablet by mouth every morning  Qty: 90 tablet Refills: 1    losartan  (COZAAR ) 25 mg tablet  take 1 tablet by mouth once daily  Qty: 90 tablet Refills: 1    Continuous Glucose Sensor (DEXCOM G7 SENSOR)  use as directed ,  CHANGE EVERY 10 DAYS  Qty: 3 each Refills: 5  Associated Diagnoses:Diabetes mellitus    insulin  pen needle (DROPLET PEN NEEDLES) 31G X 6 MM  use 1 PEN NEEDLE to inject MEDICATION subcutaneously three times a day as directed  Qty: 200 each Refills: 1  Associated Diagnoses:Diabetes mellitus    Continuous Blood Gluc Receiver (DEXCOM G7 RECEIVER) DEVI  By 1 each no specified route continuously  Qty: 1 each Refills: 5  Associated Diagnoses:Diabetes mellitus      STOP taking these medications    ondansetron  4 mg tablet  Comments:  Reason for Stopping: Clarification of medication reconciliation, patient no longer taking it.      clindamycin  (CLEOCIN  T) 1 % lotion  Comments:  Reason for Stopping: Clarification of medication reconciliation, patient no longer taking it.      sildenafil  (REVATIO ) 20 MG tablet  Comments:  Reason for Stopping: Clarification of medication reconciliation, patient no longer taking it.          Time spent in discharge planning, management and coordination of care (minutes): 25    Signed:  Tsosie Gail, MD  05/27/2024  7:16 AM                            Significant Med Changes: None              CONSULTANT SERVICE     Pulmonology             Signed: Tsosie Gail, MD  On: 05/27/2024  at: 10:03 AM

## 2024-05-28 ENCOUNTER — Other Ambulatory Visit: Payer: Self-pay

## 2024-05-28 NOTE — Continuity of Care (Signed)
 In System Transitions Care Management Documentation:           Hospital Admission Date/Discharge Date: 05/24/2024 To 05/27/2024     Discharge Diagnosis at the time of discharge:    1. CAP (community acquired pneumonia)         Hospital Area Discharged From: [facility])     Discharge Disposition:  Home or Self Care    Pending Labs at Discharge:          Course of Hospital Stay(copied from hospital summary): CONCISE NARRATIVE:   Discharge Instructions       Activity/work restrictions / Weight bearing: As tolerated     Diet: Diet Consistent Carbohydrate or as tolerated     Follow up needed (prioritized):   We will contact your PCP to try to arrange post-hospital follow up. If we are unable to do so, please reach out yourself to your PCP to request follow up to occur within the next week.      Medications stopped, started or changed: See AVS and below    Wound/skin care: N/A      May return to program: N/A      Particular warning signs for which you should contact your physician promptly: Routine concerns, worsening respiratory symptoms.     Special Instructions, why you were here and what we did for you here:    You presented with ongoing cough and trouble breathing despite a week of Augmentin . The pulmonologist Dr. 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 Dr. Kathalene Pali discussed with you that you may have a few weeks more of cough and fatigue that should progressively improve but at this point you not require further Pulmonology follow-up. During your stay you received 4 days of an IV antibiotic and 3.5 days of an oral antibiotic; we are continuing oral antibiotics (Augmentin , again, and Doxycycline ) per Dr. Argyle Kurk recommendation to complete a 7 day total course of antibiotics. For your convenience the [facility] will deliver these medications to  your bedside before you leave. If it has not been delivered to your bed by the time of your discharge it will be ready for you across the street at the [facility] drive-through window.     Your blood pressure was elevated here but not so alarmingly that we are changing your medications at discharge. Please discuss with your PCP further blood pressure control.     Our Nutritionist evaluated you and found mild protein-calorie malnutrition in setting of weight loss from your current illness. Be mindful to have a good, protein-rich diet and discuss with your PCP if your appetite does not return to normal in the next 1-2 weeks.         Disposition: home    Disposition-[place]: home with no assistance    GOC modified this stay? N Primary care notified of the change? NA    Primary care was/will be contacted at discharge? N    Have I sought feedback today from the patient on this hospitalization? Pleased     Have I spoken with the patient's family today? Wife at bedside 05/26/2024, 05/27/2024.     How likely in my opinion is 30-day readmission (scale 1-5)?  2. He should do well but may have ongoing symptoms.       Primary Discharge Diagnoses:  CAP, POA  Secondary Diagnoses:  IDDM2    Hospital Course:   Noah Craig is a 58 y.o. male. PMH arthritis NOS (OA?) , HTN, NIDDM2, chronic OM w/ TM tubes (per ENT 04/03/2024).  Patient presented to the emergency room 05/24/2024 w/ acute rigors/sweats and 6w cough that had failed empiric Augmentin  x 7d.  Initial workup done at the ED showed a CBC with a WBC count of 11.5,plt of 145K.  CO2 at 19 with normal liver enzymes.  His lactate is normal at 0.8, while his viral swabs including COVID are negative.  His EKG shows sinus rhythm.  CT of the chest showed RLL pneumonia with some extension to adjacent lobes.       Multifocal-multilobar right-sided Pneumonia, POA, w/o hypoxia  - May have failed 1w outpatient Augmentin , or may have had viral PNA at that time which then  developed bacterial superinfection.   - Presented with continued cough, progressive rigors  - CT at admission = multifocal right sided pneumonia  - Ceftriaxone and doxycyline from admission, to Augmentin  and Doxy at discharge to complete 7d course per PULM formal consultation.   - duoneb and prn albuterol , mucinex and guifensien with codiene, incentive spirometer   - remote history of smoking having some exp wheezes; started prednisone burst 05/25/2024, stopped 05/26/2024 in absence of dx of COPDE and to help assess response to abxs  - viral panel + extend viral panel = NEG  - legionella, strep pneumonia neg  - sputum Gram stain NEG  - procal 0.18, Per UTD, Procal may have utility for deciding whether/when to de-escalate/stop abx treatment of PNA but lacks utility for determining whether to start.      IDDM2  - Home jardiance , metformian ozempic  held at admission  - Uses 40u TDD humalog  w/ meals at home per Med Rec but BG's < 200 here on Correctional insulin  MOD scale w/o boluses and TDD ~10u Humalog .   - last A1c 12/24 6.4. Recheck 05/27/2024 PENDING at time of discharge.      Hypertension, Uncontrolled, POA, resolved  Headache, POA, resolved.   - bp> 200 labetolol 10 mg x 1 given 05/25/2024, placed on telemetry until 05/26/2024.   - Increased home losartan  25mg  to 50 mg, inpatient. Not changed at discharge, as hospitalists are discouraged from acute changes in BP medications that are not strictly necessary. Can follow-up outpatient. - per patient bp is 150s baseline in the doctor office     Mild protein-calorie malnutrition, POA  - Felt probable by RD on consultation, related to current extended illness.  - No specific recommendations.       Consults: Pulmonology    Last documented BM: PTA    Discharge Exam:    BP: (154-181)/(64-92)   Temp:  [36.2 C (97.2 F)-36.7 C (98.1 F)]   Temp src: Infrared (05/28 0459)  Heart Rate:  [66-76]   Resp:  [16-18]   SpO2:  [93 %-96 %]     Cough persists and perhaps worsened. SOB  improved when not coughing. No nausea. No BM this stay    Constitutional: WNWD but Mildly-modly ill appearing -- Body mass index is 32.07 kg/m.  Eyes: Sclerae anicteric, conjunctivae pink, PER.  Ears, Nose, Mouth, and Throat: Moist, no gross lesions.  Cardiovascular: Regular rate, regular rhythm  Respiratory: CTA bilaterally, no rales, no wheezes, no dyspnea, no egophony, no focal dullness to percussion  Gastrointestinal: Nontender (including RUQ, Epig, LLQ), soft, nondistended, normal BS  Musculoskeletal: No focality  Extremities: Calves supple, no lower leg  pitting edema  Skin: Warm, dry, no generalized rash  Neurologic: Alert, face symmetric, EOMI, speech fluent, no focal motor deficits  Psychiatric: Ill affect, speech normal in context and clarity, attention good, no obvious alteration in level of consciousness       Radiology:    EKG 12 lead  Result Date: 05/25/2024  Sinus rhythm    CT chest with contrast  Result Date: 05/24/2024  Multifocal patchy airspace and groundglass opacities in the central right upper lobe and throughout the right lower lobe suggesting a multifocal or viral pneumonia. Splenomegaly and likely hepatomegaly. END OF IMPRESSION     ===   ===    EXERCISE STRESS ECHO COMPLETE 02/14/2021    - Interpretation Summary -   The left ventricular ejection fraction is normal. The calculated LVEF is 65%. There are no focal regional wall motion abnormalities.   Mild left atrial enlargement.   No significant valve disease.   Sinus rhythm with no ischemic ECG changes with stress.   Negative stress echocardiogram, no evidence of ischemia.    Labs:    BMP/Electrolytes CBC                  05/27/24     05/26/24     05/25/24     05/24/24                       0504          0510          0606          1027          Sodium       140          140          141          133           Potassium    3.4          3.7          3.5          3.3           Chloride     105          107          109*         98             CO2          23           20           21            19*           UN           11           13           13           13             Creatinine   0.84         0.85         0.84         0.95          Glucose      133*         153*         108*  205*          Calcium      8.9          9.4          8.8          8.8           Magnesium     --           --          2.0           --           Albumin       --          3.5           --          3.5           Phosphorus    --          3.3          2.9           --           Estimated Creatinine Clearance: 131.39 mL/min (based on SCr of 0.84 mg/dL).                     05/27/24     05/26/24     05/25/24     05/24/24                       0504          0510          0606          1027          WBC          6.7          8.4          8.0          11.5*         Hemoglobin   13.3         13.8         13.5         14.3          Hematocrit   39           40           39           42            Platelets    197          198          163          145*          MCV          86           86           87           86            RDW          13.3         13.6         13.6         13.8          Seg Neut %    --           --  64.9         83.6              Liver Function Cardiac Studies                  05/26/24     05/24/24                       0510          1027          AST           --          17            ALT           --          19            Alk Phos      --          77            Total Prote*  --          6.9           Albumin      3.5          3.5           Bilirubin,T*  --          1.0              Lab results:    05/25/24     05/24/24                       0606          1027          TROP T RAND* 8            10            No results for input(s): "PROBNP" in the last 72 hours.        Coagulation Other  No results for input(s): "PTI", "INR" in the last 8760 hours.  No results for input(s): "PTT" in the last 8760 hours.     No results for input(s): "CRP",  "ESR" in the last 72 hours.  Lab             05/24/24                       1027          Lactate      0.8                Laboratory Tests Pending Results     Order Current Status    Hemoglobin A1c In process    Aerobic culture Preliminary result    Blood culture Preliminary result    Blood culture Preliminary result    Legionella culture Preliminary result         Patient Instructions:   Current Discharge Medication List    START taking these medications    doxycycline  hyclate (VIBRA -TABS) 100 mg tablet  Take 1 tablet (100 mg total) by mouth every 12 hours for 4 days for Community Acquired Pneumonia.  Qty: 8 tablet Refills: 0    amoxicillin -clavulanate (AUGMENTIN ) 875-125 mg tablet  Take 1 tablet by mouth every 12 hours for 3 days for Community Acquired Pneumonia.  Qty:  6 tablet Refills: 0  Comments: Patient discharged from unit FFT 3 WEST        CONTINUE these medications which have NOT CHANGED    fluticasone  (FLONASE ) 50 MCG/ACT nasal spray  Spray 2 sprays into each nostril daily.    benzonatate (TESSALON) 100 mg capsule  Take 1 capsule (100 mg total) by mouth 3 times daily as needed.  Swallow whole. Do not crush or chew.  Qty: 30 capsule Refills: 0  Associated Diagnoses:Acute cough    insulin  lispro (HUMALOG  KWIKPEN) 100 UNIT/ML injection pen  inject 10 units with breakfast then 15 units with LUNCH and 15 units with dinner  Qty: 42 mL Refills: 1  Comments: Please dispense whichever insulin  lispro product (Humalog , Admelog , or generic) is covered by patient insurance    metFORMIN  (GLUCOPHAGE -XR) 500 mg 24 hr tablet  take 1 tablets by mouth every morning and 2 tablet every evening  Qty: 270 tablet Refills: 1    semaglutide , 1 mg/dose, (OZEMPIC ) 4 MG/3ML pen  Inject 1 mg into the skin once a week.  Qty: 3 mL Refills: 2    naproxen sodium (ANAPROX) 220 MG tablet  Take 1 tablet (220 mg total) by mouth 2 times daily (with meals). Pt takes as needed    JARDIANCE  25 MG tablet  take 1 tablet by mouth every morning  Qty:  90 tablet Refills: 1    losartan  (COZAAR ) 25 mg tablet  take 1 tablet by mouth once daily  Qty: 90 tablet Refills: 1    Continuous Glucose Sensor (DEXCOM G7 SENSOR)  use as directed , CHANGE EVERY 10 DAYS  Qty: 3 each Refills: 5  Associated Diagnoses:Diabetes mellitus    insulin  pen needle (DROPLET PEN NEEDLES) 31G X 6 MM  use 1 PEN NEEDLE to inject MEDICATION subcutaneously three times a day as directed  Qty: 200 each Refills: 1  Associated Diagnoses:Diabetes mellitus    Continuous Blood Gluc Receiver (DEXCOM G7 RECEIVER) DEVI  By 1 each no specified route continuously  Qty: 1 each Refills: 5  Associated Diagnoses:Diabetes mellitus      STOP taking these medications    ondansetron  4 mg tablet  Comments:  Reason for Stopping: Clarification of medication reconciliation, patient no longer taking it.      clindamycin  (CLEOCIN  T) 1 % lotion  Comments:  Reason for Stopping: Clarification of medication reconciliation, patient no longer taking it.      sildenafil  (REVATIO ) 20 MG tablet  Comments:  Reason for Stopping: Clarification of medication reconciliation, patient no longer taking it.          Time spent in discharge planning, management and coordination of care (minutes): 25    Signed:  Tsosie Gail, MD  05/27/2024  7:16 AM       Medications: New, Changed, or Stopped:     Medication List        START taking these medications      amoxicillin -clavulanate 875-125 mg tablet  Commonly known as: AUGMENTIN   Take 1 tablet by mouth every 12 hours for 3 days for Community Acquired Pneumonia.     doxycycline  hyclate 100 mg tablet  Commonly known as: VIBRA -TABS  Take 1 tablet (100 mg total) by mouth every 12 hours for 4 days for Community Acquired Pneumonia.            STOP taking these medications      clindamycin  1 % lotion  Commonly known as: CLEOCIN  T     ondansetron  4 mg tablet  Commonly known as: ZOFRAN      sildenafil  20 mg tablet  Commonly known as: REVATIO                Where to Get Your Medications        These  medications were sent to [facility] at the [facility]  [address] [place] [address]      Hours: Mon-Fri 9A-5:30P, Sat 9A-1P Phone: [phone]   amoxicillin -clavulanate 875-125 mg tablet  doxycycline  hyclate 100 mg tablet       These medications were sent to [id number], [place] - [address]  [address] [place], [place] [address]      Phone: [phone]   Jardiance  25 MG tablet  losartan  25 mg tablet           Future Appointments          Jun 01 2024   03:30 PM - Transitional Care Mgmt  [facility] - Hollis Lurie, MD           Jun 05 2024   03:30 PM - Unknown             Jun 05 2024   04:15 PM - Unknown  Julianna Ochs              Upcoming Appointments and To Do's:    Your To Do List       Hollis Lurie, MD. Go on 06/01/2024.    Specialties: Primary Care, Internal Medicine  Why: Follow up appointment on June 2nd at 3:30PM  Contact information  [address]  [address]  [place] [address]  [phone]                              Eddy Goodell, RN  05/28/2024

## 2024-05-28 NOTE — Telephone Encounter (Signed)
Reviewed - thank you for touching base with him.  Krystal Eaton, MD

## 2024-05-28 NOTE — Patient Instructions (Signed)
 We care about you and your health and are monitoring your health now that you're home from the hospital. As I review your progress during this period of transition at home, you will see a Patient Outreach After Visit Summary in MyChart as a result of my review of your patient chart. This is a standard of care and there is no charge for this service. Please feel free to reach out to me with any questions related to this. If you have any urgent requests or issues, please call your doctor's office.     Laure Kidney, RN

## 2024-05-28 NOTE — Progress Notes (Signed)
 In System Transitions Care Management Documentation:           Hospital Admission Date/Discharge Date: 05/24/2024 To 05/27/2024     Discharge Diagnosis at the time of discharge:    1. CAP (community acquired pneumonia)         Hospital Area Discharged From: [facility])     Discharge Disposition:  Home or Self Care    Pending Labs at Discharge:          Course of Hospital Stay(copied from hospital summary): Noah Craig is a 58 y.o. male. PMH arthritis NOS (OA?) , HTN, NIDDM2, chronic OM w/ TM tubes (per ENT 04/03/2024).  Patient presented to the emergency room 05/24/2024 w/ acute rigors/sweats and 6w cough that had failed empiric Augmentin  x 7d.  Initial workup done at the ED showed a CBC with a WBC count of 11.5,plt of 145K.  CO2 at 19 with normal liver enzymes.  His lactate is normal at 0.8, while his viral swabs including COVID are negative.  His EKG shows sinus rhythm.  CT of the chest showed RLL pneumonia with some extension to adjacent lobes.       Multifocal-multilobar right-sided Pneumonia, POA, w/o hypoxia  - May have failed 1w outpatient Augmentin , or may have had viral PNA at that time which then developed bacterial superinfection.   - Presented with continued cough, progressive rigors  - CT at admission = multifocal right sided pneumonia  - Ceftriaxone and doxycyline from admission, to Augmentin  and Doxy at discharge to complete 7d course per PULM formal consultation.   - duoneb and prn albuterol , mucinex and guifensien with codiene, incentive spirometer   - remote history of smoking having some exp wheezes; started prednisone burst 05/25/2024, stopped 05/26/2024 in absence of dx of COPDE and to help assess response to abxs  - viral panel + extend viral panel = NEG  - legionella, strep pneumonia neg  - sputum Gram stain NEG  - procal 0.18, Per UTD, Procal may have utility for deciding whether/when to de-escalate/stop abx treatment of PNA but lacks utility for determining whether to start.       IDDM2  - Home jardiance , metformian ozempic  held at admission  - Uses 40u TDD humalog  w/ meals at home per Med Rec but BG's < 200 here on Correctional insulin  MOD scale w/o boluses and TDD ~10u Humalog .   - last A1c 12/24 6.4. Recheck 05/27/2024 PENDING at time of discharge.      Hypertension, Uncontrolled, POA, resolved  Headache, POA, resolved.   - bp> 200 labetolol 10 mg x 1 given 05/25/2024, placed on telemetry until 05/26/2024.   - Increased home losartan  25mg  to 50 mg, inpatient. Not changed at discharge, as hospitalists are discouraged from acute changes in BP medications that are not strictly necessary. Can follow-up outpatient. - per patient bp is 150s baseline in the doctor office     Mild protein-calorie malnutrition, POA  - Felt probable by RD on consultation, related to current extended illness.  - No specific recommendations.     Radiology:    EKG 12 lead  Result Date: 05/25/2024  Sinus rhythm    CT chest with contrast  Result Date: 05/24/2024  Multifocal patchy airspace and groundglass opacities in the central right upper lobe and throughout the right lower lobe suggesting a multifocal or viral pneumonia. Splenomegaly and likely hepatomegaly. END OF IMPRESSION     ===   ===    EXERCISE STRESS ECHO COMPLETE 02/14/2021    -  Interpretation Summary -   The left ventricular ejection fraction is normal. The calculated LVEF is 65%. There are no focal regional wall motion abnormalities.   Mild left atrial enlargement.   No significant valve disease.   Sinus rhythm with no ischemic ECG changes with stress.   Negative stress echocardiogram, no evidence of ischemia.    Labs:    BMP/Electrolytes CBC                  05/27/24     05/26/24     05/25/24     05/24/24                       0504          0510          0606          1027          Sodium       140          140          141          133           Potassium    3.4          3.7          3.5          3.3           Chloride     105          107           109*         98            CO2          23           20           21            19*           UN           11           13           13           13             Creatinine   0.84         0.85         0.84         0.95          Glucose      133*         153*         108*         205*          Calcium      8.9          9.4          8.8          8.8           Magnesium     --           --          2.0           --           Albumin       --  3.5           --          3.5           Phosphorus    --          3.3          2.9           --           Estimated Creatinine Clearance: 131.39 mL/min (based on SCr of 0.84 mg/dL).                     05/27/24     05/26/24     05/25/24     05/24/24                       0504          0510          0606          1027          WBC          6.7          8.4          8.0          11.5*         Hemoglobin   13.3         13.8         13.5         14.3          Hematocrit   39           40           39           42            Platelets    197          198          163          145*          MCV          86           86           87           86            RDW          13.3         13.6         13.6         13.8          Seg Neut %    --           --          64.9         83.6              Liver Function Cardiac Studies                  05/26/24     05/24/24                       0510          1027          AST           --  17            ALT           --          19            Alk Phos      --          77            Total Prote*  --          6.9           Albumin      3.5          3.5           Bilirubin,T*  --          1.0              Lab results:    05/25/24     05/24/24                       0606          1027          TROP T RAND* 8            10            No results for input(s): "PROBNP" in the last 72 hours.        Coagulation Other  No results for input(s): "PTI", "INR" in the last 8760 hours.  No results for input(s): "PTT" in the last 8760 hours.     No results  for input(s): "CRP", "ESR" in the last 72 hours.  Lab             05/24/24                       1027          Lactate      0.8                  Medications: New, Changed, or Stopped:  START taking these medications      amoxicillin -clavulanate 875-125 mg tablet  Commonly known as: AUGMENTIN   Take 1 tablet by mouth every 12 hours for 3 days for Community Acquired Pneumonia.     doxycycline  hyclate 100 mg tablet  Commonly known as: VIBRA -TABS  Take 1 tablet (100 mg total) by mouth every 12 hours for 4 days for Community Acquired Pneumonia.       STOP taking these medications      clindamycin  1 % lotion  Commonly known as: CLEOCIN  T     ondansetron  4 mg tablet  Commonly known as: ZOFRAN      sildenafil  20 mg tablet  Commonly known as: REVATIO        TCM POST DISCHARGE CONTACT 3 OUTREACH  Risk of Admission or ED Visit  Current as of 3 hours ago        8% 40 to 100%: High Risk   20 to < 40%: Medium Risk   0 to < 20%: Low Risk     Last Change:           This score indicates an adult patient's 1-year risk, as a percentage, of a hospital admission or ED visit.             Current PCP:  Hollis Lurie, MD  Care Management Intervention: Care Manager Intervention Completed       Spoke To:: patient      Are you feeling as good as you did when you left the hospital?: Yes         Has home care agency contacted patient yet?: N/A      If equipment was ordered at time of discharge, does the patient have this in the home?: N/A       Is the patient aware of pending orders / testing? : N/A      Do you have any questions about your discharge instructions?: No      Do you have an appointment scheduled with your PCP and know the date and time of that appointment?: Yes (Monday 6/2)      Do you have transportation to get to that appointment?: Yes      Do you have all of your medications listed on your discharge summary and are you taking them as they are written on the bottle?: Yes         Discharge medications  reviewed and reconciled against outpatient MEDICAL RECORD NUMBER: Yes      Do you have any questions or concerns about your medications?: No         If the patient is newly initiated on anticoagulation, are they aware who is managing this?: N/A      Reviewed medications with:: patient      Time spent on the call with the patient:: 10 minutes      Patient Care Management assessment and plan of care/next steps:: Noah Craig feels okay - still has a cough, mostly dry but will produce some clear sputum (as opposed to brown before). He is going to try and get out of the house a little today and move around more. Medications confirmed, including anitiboitcs. He has a follow up with PCP on Monday.           Noah Craig, BSN, RN  [facility]  Centralized Transitional Care Manager  May 28, 2024

## 2024-05-30 LAB — BLOOD CULTURE
Bacterial Blood Culture: 0
Bacterial Blood Culture: 0

## 2024-05-31 ENCOUNTER — Other Ambulatory Visit: Payer: Self-pay

## 2024-06-01 ENCOUNTER — Ambulatory Visit: Attending: Primary Care | Admitting: Primary Care

## 2024-06-01 VITALS — BP 144/70 | HR 77 | Temp 98.0°F | Resp 16 | Ht 74.0 in | Wt 256.2 lb

## 2024-06-01 DIAGNOSIS — Z794 Long term (current) use of insulin: Secondary | ICD-10-CM

## 2024-06-01 DIAGNOSIS — Z125 Encounter for screening for malignant neoplasm of prostate: Secondary | ICD-10-CM

## 2024-06-01 DIAGNOSIS — E11319 Type 2 diabetes mellitus with unspecified diabetic retinopathy without macular edema: Secondary | ICD-10-CM

## 2024-06-01 DIAGNOSIS — J189 Pneumonia, unspecified organism: Secondary | ICD-10-CM

## 2024-06-01 MED ORDER — LOSARTAN POTASSIUM 25 MG PO TABS *I*
25.0000 mg | ORAL_TABLET | Freq: Two times a day (BID) | ORAL | Status: DC
Start: 2024-06-01 — End: 2024-07-09

## 2024-06-01 NOTE — Progress Notes (Signed)
 [facility]: Transition Care Management Face-to-Face Visit  Date of today's visit: 06/01/2024   Patient 's hospital discharge date: 05/27/2024   I have reviewed the transition care management contact note dated, 05/28/2024   I have reviewed the discharge summary  I have performed a medication reconciliation       Subjective   Noah Craig is presenting today for transition care management    TCM note reviewed.  Patient admitted to [facility] 5/25-5/28 with community-acquired pneumonia.  Covid testing normal; leukocytosis of 11.5, CO2 of 19 with normal lactate.  CT demonstrated RLL-predominant PNA.  Was evaluated by Dr. Kathalene Pali (pulmonary).  CTX and doxy at admission were transitioned to Augmentin  and doxy at discharge to complete 7 days of antibiotic therapy.  Legionella and strep antigen testing negative as well.      A1c pending at time of discharge.      Also during his stay, had significant elevated SBP > 200 was was given a dose of labetalol , kept on same home medications at discharge.      Since discharge, cough has been productive of clear sputum.  No fevers.  Did have some loose stools on the doxycycline  and augmentin  but overall tolerated them well.  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        Objective   Blood pressure 144/70, pulse 77, temperature 36.7 C (98 F), temperature source Temporal, resp. rate 16, height 1.88 m (6' 2), weight 116.2 kg (256 lb 3.2 oz), SpO2 98%.    General: Well-developed male, in no acute distress.  Lungs: Breathing comfortably at rest.  Lungs with coarse rales at the right base; otherwise clear.  Cardiovascular: RRR no m/r/g.  Extremities warm and dry to touch.   Extremities: No edema, no rashes.  Neurologic: Fully interactive and cooperative with exam.  Able to provide an accurate history.  Speech and mood appropriate.      Assessment & Plan     1. Community acquired pneumonia of right lower lobe of lung   - Resolving nicely with antibiotic therapy.   Continue to monitor for recurrence of symptoms after antibiotics complete.    - Recommend prevnar-20 vaccine once recovered from this illness - has already had pneumovax back in 2009.    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   2. Screening for prostate cancer   - Update PSA with labs for routine screening.    3. Type 2 diabetes mellitus with retinopathy, with long-term current use of insulin , macular edema presence unspecified, unspecified laterality, unspecified retinopathy severity   - A1c 5.8.  No changes to jardiance , semaglutide , metformin  or insulin .    - Increase losartan  to 25 mg BID for better BP control.             Ok late this week to return to [employer] duties.   Hollis Lurie, MD

## 2024-06-02 LAB — LEGIONELLA CULTURE: Legionella Culture: 0

## 2024-06-15 ENCOUNTER — Other Ambulatory Visit: Payer: Self-pay | Admitting: Primary Care

## 2024-06-15 DIAGNOSIS — E119 Type 2 diabetes mellitus without complications: Secondary | ICD-10-CM

## 2024-06-15 MED ORDER — METFORMIN HCL 500 MG PO TB24 *I*: ORAL_TABLET | ORAL | 1 refills | Status: DC

## 2024-06-15 MED ORDER — DEXCOM G7 SENSOR MISC *A*: 5 refills | Status: DC

## 2024-06-15 NOTE — Telephone Encounter (Signed)
 LOV - 06-01-24  NOV - 12-08-24  LABS - 12-11-23    Last Date Filled - 05-13-24

## 2024-06-15 NOTE — Telephone Encounter (Addendum)
 Last seen 06-01-24 and next apt 12-08-24. CMP, A1c 5/25, lipids 12/24. All ordered with urine microalbumin.    Left message with patient to confirm he did want prescription to go to [facility] as I had a note he no longer used [facility]. Patient confirmed.

## 2024-06-19 ENCOUNTER — Other Ambulatory Visit: Payer: Self-pay | Admitting: Primary Care

## 2024-06-19 DIAGNOSIS — E119 Type 2 diabetes mellitus without complications: Secondary | ICD-10-CM

## 2024-06-19 MED ORDER — DROPLET PEN NEEDLES 31G X 6 MM MISC
1 refills | Status: DC
Start: 2024-06-19 — End: 2024-10-23

## 2024-06-19 NOTE — Telephone Encounter (Signed)
 LOV - 06-01-24  NOV - 12-08-24  LABS - 12-11-23    Last Date Filled - 12-09-23

## 2024-07-02 ENCOUNTER — Encounter: Payer: Self-pay | Admitting: Primary Care

## 2024-07-09 ENCOUNTER — Encounter: Payer: Self-pay | Admitting: Primary Care

## 2024-07-09 MED ORDER — LOSARTAN POTASSIUM 25 MG PO TABS *I*
ORAL_TABLET | ORAL | Status: DC
Start: 2024-07-09 — End: 2024-07-27

## 2024-07-22 ENCOUNTER — Encounter: Payer: Self-pay | Admitting: Gastroenterology

## 2024-07-22 NOTE — Telephone Encounter (Signed)
 Called patient as he had not reviewed message.  He said he didn't get a notification.  He will need a refill.  Sent separate refill encounter.

## 2024-07-23 ENCOUNTER — Other Ambulatory Visit: Payer: Self-pay | Admitting: Primary Care

## 2024-07-23 NOTE — Telephone Encounter (Signed)
 LOV 06/01/24  NOV 12/08/24  Labs none      Last ordered: 2 months ago (05/13/2024

## 2024-07-27 ENCOUNTER — Other Ambulatory Visit: Payer: Self-pay | Admitting: Primary Care

## 2024-07-27 MED ORDER — LOSARTAN POTASSIUM 25 MG PO TABS *I*: ORAL_TABLET | ORAL | 1 refills | Status: DC

## 2024-07-27 NOTE — Telephone Encounter (Signed)
 LOV - 06-01-24  NOV - 12-08-24  LABS -12-11-23

## 2024-08-17 ENCOUNTER — Encounter: Payer: Self-pay | Admitting: Emergency Medicine

## 2024-08-17 ENCOUNTER — Ambulatory Visit: Attending: Primary Care | Admitting: Medical

## 2024-08-17 ENCOUNTER — Other Ambulatory Visit: Payer: Self-pay

## 2024-08-17 ENCOUNTER — Telehealth: Payer: Self-pay | Admitting: Primary Care

## 2024-08-17 ENCOUNTER — Ambulatory Visit: Attending: Emergency Medicine | Admitting: Emergency Medicine

## 2024-08-17 VITALS — BP 128/78 | HR 65 | Temp 98.4°F | Resp 16

## 2024-08-17 DIAGNOSIS — L03032 Cellulitis of left toe: Secondary | ICD-10-CM | POA: Insufficient documentation

## 2024-08-17 DIAGNOSIS — L089 Local infection of the skin and subcutaneous tissue, unspecified: Secondary | ICD-10-CM

## 2024-08-17 MED ORDER — CEPHALEXIN 500 MG PO CAPS *I*: 500.0000 mg | ORAL_CAPSULE | Freq: Four times a day (QID) | ORAL | 0 refills | Status: AC

## 2024-08-17 NOTE — Patient Instructions (Signed)
 If you have any questions or concerns about today's visit please contact the [facility] Primary Care Digital Health Team at [phone]

## 2024-08-17 NOTE — Progress Notes (Signed)
 TeleHome Video Encounter Mode of Communication with Patient for This Visit  Mode of Communication with Patient for This Visit: VideoPatient Location: HomeProvider Location: Home Visit date: 8/18/2025Consent was obtained from the patient to complete this video visit; including the potential for financial liability. Subjective Noah Craig,[birth year], had concerns including Toe Pain.[name] is a 58 year old male who presented for a VUC visit with a chief complaint of L great toe pain. Cherry states that his toe was normal Saturday morning but when he took his shoes off at the end of the day the L great toe felt sore. He took his socks off and it looked swollen and red. He has not had any worsening but also has not had any improvement over the past day and a half. Denies any fevers, chills. Denies any drainage.    Plan Proximal nail bed looks edematous and inflammed- Ascher states that it is sore to touch but cannot tell if it is fluctuant. Given appearance and hx of DMTII- sent to UC for exam and possible drainage. The fee for this visit has been waived.  Follow up: Follow up today (on 08/17/2024).Author: Greig Furlough, PA  [facility] Digital Health TeamNote signed: 08/17/2024

## 2024-08-17 NOTE — Telephone Encounter (Signed)
 GeneralSymptoms: Left Big Toe, bruised, red, puffy, nothing coming out of the toe, painful, no injuryWhen did symptoms start? 8-16-25Did you try anything to lessen your symptoms?  NoIf yes What?  When?  Did it help? No appointments left with any provider today or the rest of the week.  Advised on demand UC video.  Patient agreed to try.

## 2024-08-17 NOTE — Patient Instructions (Signed)
 You were seen today with redness and swelling along the nail of your great toeThis looks consistent with an infection of the skin surrounding your toenailYou  should take the antibiotic as directedYou should soak your toe in warm soapy water  a few times a daily to help resolve the infectionIf you develop worsening swelling, pain, redness extending to your foot, fever or new concerns seek re-evaluationIf you are not improving follow up with your primary care doctor

## 2024-08-17 NOTE — UC Provider Note (Signed)
 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 Denies known injury to toe. Does report he does trapshooting which causes him to put a lot of pressure on is toes but does not recall any actual injury. Reports mild pain to skin surrounding his toenail without pain in plantar aspect of toe or pain with movement of toe or walking. Denies any fever, chills or malaise. Denies any other skin rash or injury. 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 Respiratory:  Negative for cough and shortness of breath.  Cardiovascular:  Negative for chest pain. Gastrointestinal:  Negative for abdominal pain. Musculoskeletal:  Negative for arthralgias and joint swelling. Skin:  Positive for rash. Physical Exam Vitals   First  Recorded BP: 128/78, Resp: 16, Temp: 36.9 C (98.4 F), Temp src: TEMPORAL Oxygen Therapy SpO2: 100 %, Heart Rate: 65, (08/17/24 1229)  .Physical ExamVitals and nursing note reviewed. Constitutional:     Appearance: Normal appearance. HENT:    Head: Normocephalic and atraumatic. Pulmonary:    Effort: Pulmonary effort is normal. Musculoskeletal:      Feet:   Comments: Left great toe without bony tenderness, FROM without pain Skin:   General: Skin is warm and dry. Neurological:    General: No focal deficit present.    Mental Status: He is alert and oriented to person, place, and time. Psychiatric:       Mood and Affect: Mood normal.  Medical Decision Making Medical Decision MakingAssessment:   58 year old male presents with redness and swelling to skin at base of left great toenail for past 2 days. Patient well appearing, afebrile. Left great toe with erythema and edema to skin at base of nail without extension to DIP joint or proximal phalanx. No warmth or fluctuance noted. NO bony tenderness or loss of range of motion. Differential diagnosis:  Left great toe paronychia, cellulitis, ? Contusion ? Friction blister , no bony tenderness, Pain with ROM or injury to raise concern for fracture Plan and Results:  No testing indicatedDiagnosis and Disposition: Left great toe paronychiaDischarge home, keflex  for 5 days, warm soaks throughout the day. Follow up with PCP if not improving. Return precautions discussed. Patient understanding and agreeable with planReturn precautions discussed and provided on AVS.Discharge MedicationsStart Taking       cephalexin  (KEFLEX ) 500 mg capsule Take 1 capsule (500 mg total) by mouth every 6 hours for 5 days for Infection of the Skin and/or Skin Structures.  Final Diagnosis  ICD-10-CM ICD-9-CM 1. Paronychia of toe of left foot  L03.032 681.11 Ltanya Burlington,  PA  [1] Past Medical History:Diagnosis Date  Arthritis   knees, hands  Colon polyp   Complication of anesthesia   slow to awaken  Diabetes mellitus   Hypertension  [2] Past Surgical History:Procedure Laterality Date  COLONOSCOPY    JOINT REPLACEMENT    KNEE SURGERY Bilateral   reconstruction; several surgeries  PR TYMPANOSTOMY GENERAL ANESTHESIA Bilateral 02/21/2024  Procedure: INSERTION, TYMPANOSTOMY TUBE, BILATERAL;  Surgeon: Kaza, Srinivas, MD;  Location: [facility] MAIN OR;  Service: ENT  TONSILLECTOMY   [3] Family HistoryProblem Relation Name Age of Onset  Heart Disease Father   [4] Social HistoryTobacco Use  Smoking status: Former   Packs/day: 1.00   Years: 10.00   Additional pack years: 0.00   Total pack years: 10.00   Types: Cigarettes   Quit date: 44   Years since quitting: 33.6  Smokeless tobacco: Former   Quit date: 01/01/1992 Substance Use Topics  Alcohol use: Yes   Comment:  monthly  Drug use: No

## 2024-09-14 ENCOUNTER — Emergency Department

## 2024-09-14 ENCOUNTER — Emergency Department
Admission: EM | Admit: 2024-09-14 | Discharge: 2024-09-14 | Disposition: A | Discharge status: Home / Self Care | Source: Ambulatory Visit | Attending: Emergency Medicine | Admitting: Emergency Medicine

## 2024-09-14 DIAGNOSIS — T383X6A Underdosing of insulin and oral hypoglycemic [antidiabetic] drugs, initial encounter: Secondary | ICD-10-CM | POA: Insufficient documentation

## 2024-09-14 DIAGNOSIS — R002 Palpitations: Secondary | ICD-10-CM | POA: Insufficient documentation

## 2024-09-14 DIAGNOSIS — E119 Type 2 diabetes mellitus without complications: Secondary | ICD-10-CM

## 2024-09-14 DIAGNOSIS — R42 Dizziness and giddiness: Secondary | ICD-10-CM | POA: Insufficient documentation

## 2024-09-14 DIAGNOSIS — R0602 Shortness of breath: Secondary | ICD-10-CM

## 2024-09-14 DIAGNOSIS — Z794 Long term (current) use of insulin: Secondary | ICD-10-CM | POA: Insufficient documentation

## 2024-09-14 DIAGNOSIS — E1165 Type 2 diabetes mellitus with hyperglycemia: Secondary | ICD-10-CM | POA: Insufficient documentation

## 2024-09-14 DIAGNOSIS — I10 Essential (primary) hypertension: Secondary | ICD-10-CM

## 2024-09-14 DIAGNOSIS — Z87891 Personal history of nicotine dependence: Secondary | ICD-10-CM

## 2024-09-14 DIAGNOSIS — R0600 Dyspnea, unspecified: Secondary | ICD-10-CM

## 2024-09-14 DIAGNOSIS — Y9289 Other specified places as the place of occurrence of the external cause: Secondary | ICD-10-CM | POA: Insufficient documentation

## 2024-09-14 LAB — CBC AND DIFFERENTIAL
Baso # K/uL: 0 THOU/uL (ref 0.0–0.2)
Eos # K/uL: 0.1 THOU/uL (ref 0.0–0.5)
Hematocrit: 41 % (ref 37–52)
Hemoglobin: 14.8 g/dL (ref 12.0–17.0)
IMM Granulocytes #: 0 THOU/uL
IMM Granulocytes: 0.7 %
Lymph # K/uL: 1.5 THOU/uL (ref 1.0–5.0)
MCV: 85 fL (ref 75–100)
Mono # K/uL: 0.4 THOU/uL (ref 0.1–1.0)
Neut # K/uL: 3.9 THOU/uL (ref 1.5–6.5)
Nucl RBC # K/uL: 0 THOU/uL
Nucl RBC %: 0 /100{WBCs} (ref 0.0–0.2)
Platelets: 171 THOU/uL (ref 150–450)
RBC: 4.8 MIL/uL (ref 4.0–6.0)
RDW: 12.9 % (ref 0.0–15.0)
Seg Neut %: 65.2 %
WBC: 6 THOU/uL (ref 3.5–11.0)

## 2024-09-14 LAB — EKG 12-LEAD
P: 29 deg
PR: 186 ms
QRS: 40 deg
QRSD: 89 ms
QT: 401 ms
QTc: 407 ms
Rate: 62 {beats}/min
T: 76 deg

## 2024-09-14 LAB — TROPONIN T 3 HR W/ DELTA HIGH SENSITIVITY (IP/ED ONLY)
TROP T 0-3 HR DELTA High Sensitivity: 0 (ref 0–4)
TROP T 3 HR High Sensitivity: 8 ng/L (ref 0–14)

## 2024-09-14 LAB — MAGNESIUM: Magnesium: 2.1 mg/dL (ref 1.6–2.5)

## 2024-09-14 LAB — NT-PRO BNP: NT-pro BNP: <50 pg/mL (ref 0–900)

## 2024-09-14 LAB — TSH: TSH: 1.85 u[IU]/mL (ref 0.27–4.20)

## 2024-09-14 LAB — POCT GLUCOSE: Glucose POCT: 205 mg/dL — ABNORMAL HIGH (ref 60–99)

## 2024-09-14 LAB — TROPONIN T 0 HR HIGH SENSITIVITY (IP/ED ONLY): TROP T 0 HR High Sensitivity: 8 ng/L (ref 0–11)

## 2024-09-14 NOTE — Discharge Instructions (Signed)
 Your cardiac workup in the emergency department was reassuring.Outpatient follow-up with cardiology is recommended.Return to the emergency department for any worsening symptoms, shortness of breath, episodes of passing out, or any other concerns.

## 2024-09-14 NOTE — ED Triage Notes (Signed)
 Patient arrived by EMS.  Completed bus run, felt palpitations, tingling down left arm and is SOB Blood Glucose Meter (mg/dl): 205Prehospital medications given: YesCardiac Meds: Aspirin  (325mg )

## 2024-09-14 NOTE — Bed Hold Note (Signed)
 Bed: TED-08Expected date: 9/15/25Expected time: 8:12 AMMeans of arrival: Comments:EMS  CP

## 2024-09-14 NOTE — ED Provider Notes (Signed)
 History Chief Complaint Patient presents with  Chest Pain  Shortness of Breath 58 year old male with a history of insulin -dependent diabetes, hospitalization for community-acquired pneumonia in May 2025 presents with dyspnea, palpitations, left upper extremity tingling and lightheadedness.  Denies chest pain, onset approximately 0720.  Patient received 324 mg of aspirin  by EMS, no previous history, no known cardiac disease though has a history of hypertension, treated with losartan .Patient notes he did not take his evening insulin  last night, BG 205 on arrival.  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  Cardiovascular:  Positive for palpitations. Negative for leg swelling. Neurological:  Positive for light-headedness. Physical Exam Triage Vitals   First Recorded BP: 169/78, Resp: 18, Temp: 35.9 C (96.6 F) Oxygen Therapy SpO2: 98 %, O2 Device: None (Room air), Heart Rate: 66, (09/14/24 0819)  .First Pain Reported 0-10  Pain Scale: 0, (09/14/24 9180) Physical ExamVitals and nursing note reviewed. Constitutional:     Appearance: He is well-developed. HENT:    Head: Normocephalic and atraumatic. Cardiovascular:    Rate and Rhythm: Normal rate and  regular rhythm.    Heart sounds: Normal heart sounds. Pulmonary:    Effort: Pulmonary effort is normal.    Breath sounds: Normal breath sounds. Musculoskeletal:    Cervical back: Normal range of motion.    Right lower leg: No tenderness. No edema.    Left lower leg: No tenderness. No edema.    Comments: Well-healed anterior knee incisional scars Skin:   Capillary Refill: Capillary refill takes less than 2 seconds. Neurological:    General: No focal deficit present.    Mental Status: He is alert and oriented to person, place, and time. Psychiatric:       Mood and Affect: Mood is anxious.       Behavior: Behavior normal. Medical Decision Making Assessment:  58 year old male presents with abrupt onset of lightheadedness, palpitations, now resolved, left upper extremity tingling.  Compliant with blood pressure meds this morning, but missed insulin , BG 205.  Received aspirin  prior to arrivalDifferential diagnosis:  ACS, arrhythmia,, dehydration, electrolyte abnormality, hypothyroidismPlan:  Orders Placed This Encounter    *Chest standard frontal and lateral views    *Chest standard frontal and lateral views    CBC and differential    Troponin T 0 HR High Sensitivity    Troponin T 3 HR W/ Delta High Sensitivity    TSH    Magnesium     NT-pro BNP    Continuous telemetry, non-protocol    EKG 12 lead (initial)    EKG: follow up    Insert peripheral IV EKG Interpretation:  Tracing reviewed by myself, normal sinus rhythm, no ischemic changesIndependent interpretation of imaging: No acute findings on CXR.Consideration of hospitalization: Considered, but given reassuring workup, stable troponins and no ischemic changes on EKG refer for outpatient follow-up with cardiologyED Course and Disposition:  Laboratory evaluation demonstrated stable, adynamic troponins.  EKG without acute ischemia.  No ectopy arrhythmia on monitor.  CBC showed  no evidence of significant leukocytosis or anemia.Given reassuring workup, patient was referred for outpatient cardiology follow-up for consideration of provocative testing, further workup as necessary.ED Course as of 09/16/24 1542 Mon Sep 14, 2024 0928 TROP T 0 HR High Sensitivity: 8 0928 EKG 12 lead (initial)Sinus rhythm, rate 62, no acute ischemia 1251 TROP T 3 HR High Sensitivity: 8 1252 0 and 3-hour troponins are negative.  No ectopy, arrhythmia on the monitor.Chest x-ray is clear at this point stable for discharge, will make referral to cardiology for consideration of outpatient provocative testing DONNICE BANTER, MD  [1] Past Medical History:Diagnosis Date  Arthritis   knees, hands  Colon polyp   Complication of anesthesia   slow to awaken  Diabetes mellitus   Hypertension  [2] Past Surgical History:Procedure Laterality Date  COLONOSCOPY    JOINT REPLACEMENT    KNEE SURGERY Bilateral   reconstruction; several surgeries  PR TYMPANOSTOMY GENERAL ANESTHESIA Bilateral 02/21/2024  Procedure: INSERTION, TYMPANOSTOMY TUBE, BILATERAL;  Surgeon: Kaza, Srinivas, MD;  Location: [facility] MAIN OR;  Service: ENT  TONSILLECTOMY   [3] Social HistoryTobacco Use  Smoking status: Former   Packs/day: 1.00   Years: 10.00   Additional pack years: 0.00   Total pack years: 10.00   Types: Cigarettes   Quit date: 75   Years since quitting: 33.7  Smokeless tobacco: Former   Quit date: 01/01/1992 Substance Use Topics  Alcohol use: Yes   Comment:  monthly  Drug use: No  BANTER DONNICE, MD09/17/25 [id number]

## 2024-09-15 ENCOUNTER — Telehealth: Payer: Self-pay | Admitting: Primary Care

## 2024-09-15 ENCOUNTER — Encounter: Payer: Self-pay | Admitting: Primary Care

## 2024-09-15 ENCOUNTER — Ambulatory Visit: Payer: Self-pay | Admitting: Primary Care

## 2024-09-15 ENCOUNTER — Other Ambulatory Visit: Payer: Self-pay

## 2024-09-15 VITALS — BP 158/78 | HR 67 | Temp 98.1°F | Resp 16 | Ht 74.02 in | Wt 257.0 lb

## 2024-09-15 DIAGNOSIS — R002 Palpitations: Secondary | ICD-10-CM

## 2024-09-15 MED ORDER — METOPROLOL TARTRATE 25 MG PO TABS *I*
ORAL_TABLET | ORAL | 0 refills | Status: DC
Start: 2024-09-15 — End: 2024-10-01

## 2024-09-15 NOTE — Telephone Encounter (Signed)
 Patient was seen on 9-15 at [facility] ED for heart palpitations.  His discharge instructions say to reach out to his cardiologist.  He has scheduled an appointment with them on 09-21-24.  He said he experienced more heart palpitations this morning lasted about 5-6 seconds.  Spoke with ST.  She advised him to call cardiology this morning and let them know.  If he still wants to be seen in our office to call back for an appointment.  Dr. Michaele has a 3:00 today.FYI

## 2024-09-15 NOTE — Telephone Encounter (Signed)
 Pt scheduled with MB today at 3:00 PM. FYI

## 2024-09-15 NOTE — Telephone Encounter (Signed)
 Noted.  Marjory Sneddon, MD

## 2024-09-15 NOTE — Progress Notes (Signed)
 [facility] Reason For Visit: Chief Complaint Patient presents with  Palpitations   Yesterday and today  Subjective:  Noah Craig is a 58 y.o. man with a history of HTN, HLD, DM2, presenting for follow up after recent ED visit for palpitations.  Issues discussed below.  He presents with his wife who helps provide additional history.  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  Recalls a BP being at/around 200/100 when he felt that way.  Had a sense of doom when it was occurring.  No diarrhea or vomiting.  Subsided by time he got to ED.  ECG showed sinus rhythm.  Troponin testing normal.  Chest x-ray clear.  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  Home BPs can be in the 120s systolic.  Home Medications: Prior to Admission medications Medication Sig Start Date End Date Taking? Authorizing Provider losartan  (COZAAR ) 25 mg tablet Take 50 mg in the morning and 25 mg in the evening. 07/27/24  Yes Michaele Cough, MD OZEMPIC , 1 MG/DOSE, 4 MG/3ML pen INJECT 1MG  SUBCUTANEOUSLY (UNDER THE SKIN) ONCE EVERY WEEK 07/23/24  Yes Michaele Cough, MD insulin  pen needle (DROPLET PEN NEEDLES) 31G X 6 MM use 1 PEN NEEDLE to inject MEDICATION subcutaneously three times a day as directed 06/19/24  Yes Michaele Cough, MD Continuous Glucose Sensor (DEXCOM G7 SENSOR) use as directed , CHANGE EVERY 10 DAYS 06/15/24  Yes Michaele Cough, MD metFORMIN  (GLUCOPHAGE -XR) 500 mg 24 hr tablet take 1 tablets by mouth every morning and 2 tablet every evening 06/15/24  Yes Michaele Cough, MD JARDIANCE  25 MG tablet take 1 tablet by mouth every morning 05/26/24  Yes Michaele Cough, MD fluticasone  (FLONASE ) 50  MCG/ACT nasal spray Spray 2 sprays into each nostril daily.   Yes [provider] insulin  lispro (HUMALOG  KWIKPEN) 100 UNIT/ML injection pen inject 10 units with breakfast then 15 units with LUNCH and 15 units with dinner 05/13/24  Yes Michaele Cough, MD Continuous Blood Gluc Receiver (DEXCOM G7 RECEIVER) DEVI By 1 each no specified route continuously 02/19/22  Yes Mickie Leita HERO, PA naproxen sodium (ANAPROX) 220 MG tablet Take 1 tablet (220 mg total) by mouth 2 times daily (with meals). Pt takes as needed   Yes [provider] benzonatate  (TESSALON ) 100 mg capsule Take 1 capsule (100 mg total) by mouth 3 times daily as needed.  Swallow whole. Do not crush or chew.Patient not taking: Reported on 09/15/2024 05/22/24   Rojelio Husband, NP Allergies: Allergies[1]Review of Systems: As detailed above by problem. Physical Exam:Temp Readings from Last 3 Encounters: 09/15/24 36.7 C (98.1 F) (Temporal) 09/14/24 35.9 C (96.6 F) 08/17/24 36.9 C (98.4 F) (Temporal) BP Readings from Last 3 Encounters: 09/15/24 158/78 09/14/24 169/78 08/17/24 128/78 Pulse Readings from Last 3 Encounters: 09/15/24 67 09/14/24 66 08/17/24 65   Vitals:  09/15/24 1502 BP: 158/78 BP Location: Left arm Patient Position: Sitting Cuff Size: adult Pulse: 67 Resp: 16 Temp: 36.7 C (98.1 F) TempSrc: Temporal SpO2: 99% Weight: 116.6 kg (257 lb) Height: 1.88 m (6' 2.02) Wt Readings from Last 3 Encounters: 09/15/24 116.6 kg (257 lb) 09/14/24 113.4 kg (250 lb) 06/01/24 116.2 kg (256 lb 3.2 oz) General: Well-developed male, in no acute distress.Lungs: Breathing comfortably at rest.  Lungs CTAB without adventitious sounds.Cardiovascular: RRR no m/r/g.  Extremities warm and dry to touch. Extremities: No edema, no rashes.Neurologic: Fully interactive and cooperative with exam.  Able to provide an accurate history.  Speech and mood  appropriate.  ASSESSMENT and Plan: 58 y.o. male with a history of HTN, HLD, DM2, presenting for follow up after recent ED visit for palpitations.  Plan by problem below.1. Palpitations - Arrhythmia vs less likely angina vs endocrinologic etiology.  Unlikely GI source of symptoms.  Panic is in the differential as well but need to exclude other etiologies first.  - TSH normal, CBC normal, Troponin testing normal.  ECG after symptoms improved was sinus.  Recommended the following.- Update labs for metabolic panel, Free T4, metanephrines.- Start metoprolol  25 mg BID + 1 PRN 25 mg dose per day if needed for palpitations.- Follow up with cardiology as planned later this week - will likely need a dedicated cardiac monitor to capture his rhythm while he is having symptoms.  Donnice Judge, MD 09/15/2024 3:17 PM [1] AllergiesAllergen Reactions  Lisinopril  Cough  Victoza  [Liraglutide ] Other (See Comments)   GI upset  Environmental Allergies Other (See Comments)   Sneezing

## 2024-09-16 ENCOUNTER — Other Ambulatory Visit
Admission: RE | Admit: 2024-09-16 | Discharge: 2024-09-16 | Disposition: A | Discharge status: Home / Self Care | Source: Ambulatory Visit | Attending: Primary Care | Admitting: Primary Care

## 2024-09-16 ENCOUNTER — Encounter: Payer: Self-pay | Admitting: Gastroenterology

## 2024-09-16 DIAGNOSIS — R002 Palpitations: Secondary | ICD-10-CM | POA: Insufficient documentation

## 2024-09-16 LAB — COMPREHENSIVE METABOLIC PANEL
ALT: 40 U/L (ref 0–50)
AST: 29 U/L (ref 0–50)
Albumin: 4.4 g/dL (ref 3.5–5.2)
Alk Phos: 84 U/L (ref 40–130)
Anion Gap: 12 (ref 7–16)
Bilirubin,Total: 0.8 mg/dL (ref 0.0–1.2)
CO2: 23 mmol/L (ref 20–28)
Calcium: 9.6 mg/dL (ref 8.6–10.2)
Chloride: 103 mmol/L (ref 96–108)
Creatinine: 1.11 mg/dL (ref 0.67–1.17)
Glucose: 237 mg/dL — ABNORMAL HIGH (ref 60–99)
Lab: 15 mg/dL (ref 6–20)
Potassium: 3.9 mmol/L (ref 3.3–4.6)
Sodium: 138 mmol/L (ref 133–145)
Total Protein: 7.5 g/dL (ref 6.3–7.7)
eGFR BY CREAT: 77

## 2024-09-16 LAB — T4, FREE: Free T4: 1.1 ng/dL (ref 0.9–1.7)

## 2024-09-16 LAB — PHOSPHORUS: Phosphorus: 3.6 mg/dL (ref 2.7–4.5)

## 2024-09-18 ENCOUNTER — Ambulatory Visit: Admitting: Cardiology

## 2024-09-18 ENCOUNTER — Other Ambulatory Visit: Payer: Self-pay

## 2024-09-18 ENCOUNTER — Ambulatory Visit
Admission: RE | Admit: 2024-09-18 | Discharge: 2024-09-18 | Disposition: A | Discharge status: Home / Self Care | Source: Ambulatory Visit

## 2024-09-18 ENCOUNTER — Ambulatory Visit: Payer: Self-pay | Admitting: Primary Care

## 2024-09-18 ENCOUNTER — Encounter: Payer: Self-pay | Admitting: Cardiology

## 2024-09-18 VITALS — BP 122/78 | HR 64 | Resp 12 | Ht 74.0 in | Wt 256.2 lb

## 2024-09-18 DIAGNOSIS — R002 Palpitations: Secondary | ICD-10-CM

## 2024-09-18 DIAGNOSIS — I10 Essential (primary) hypertension: Secondary | ICD-10-CM

## 2024-09-18 DIAGNOSIS — E782 Mixed hyperlipidemia: Secondary | ICD-10-CM

## 2024-09-18 NOTE — Progress Notes (Signed)
 CARDIOLOGY CONSULTATIONI had the pleasure of seeing Noah Craig in consultation for palpitations, chest pain, and dyspnea.  The patient is a 58 year old with hypertension and dyslipidemia.  He reports that he was recently seen in the emergency room for an episode of the symptoms.  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  He went back to the bus garage and began to feel a sense of his heart racing.  He also had some associated dyspnea and chest discomfort.  He told his supervisor he did not feel right.  He was brought to the emergency room and says he remembers little else of the situation.  In the ED he was in sinus rhythm on EKG without ischemic or other changes.  Serial high-sensitivity troponins were low.  Symptoms gradually resolved.  He says a few years ago he had a similar episode but not as severe.  He has not had any recent exertional chest pain.PAST MEDICAL HISTORY:Past Medical History[1]PAST SURGICAL HISTORY:Past Surgical History[2]MEDICATIONS:Current Outpatient Medications Medication Sig  metoprolol  tartrate (LOPRESSOR ) 25 mg tablet Take 25 mg twice a day.  May take an additional 25 mg once per day as needed for palpitations.  losartan  (COZAAR ) 25 mg tablet Take 50 mg in the morning and 25 mg in the evening.  OZEMPIC , 1 MG/DOSE, 4 MG/3ML pen INJECT 1MG  SUBCUTANEOUSLY (UNDER THE SKIN) ONCE EVERY WEEK  insulin  pen needle (DROPLET PEN NEEDLES) 31G X 6 MM use 1 PEN NEEDLE to inject MEDICATION subcutaneously three times a day as directed  Continuous Glucose Sensor (DEXCOM G7 SENSOR) use as directed , CHANGE EVERY 10 DAYS  metFORMIN  (GLUCOPHAGE -XR) 500 mg 24 hr tablet take 1 tablets by mouth every morning and 2 tablet every evening  JARDIANCE  25 MG tablet take 1 tablet by mouth every morning  fluticasone  (FLONASE ) 50 MCG/ACT nasal spray Spray 2 sprays into each nostril daily.  insulin  lispro (HUMALOG  KWIKPEN) 100 UNIT/ML  injection pen inject 10 units with breakfast then 15 units with LUNCH and 15 units with dinner  Continuous Blood Gluc Receiver (DEXCOM G7 RECEIVER) DEVI By 1 each no specified route continuously  naproxen sodium (ANAPROX) 220 MG tablet Take 1 tablet (220 mg total) by mouth 2 times daily (with meals). Pt takes as needed (Patient not taking: Reported on 09/18/2024) No current facility-administered medications for this visit. ALLERGIES:Allergies[3]SOCIAL HISTORY:The patient  reports that he quit smoking about 33 years ago. His smoking use included cigarettes. He has a 10 pack-year smoking history. He quit smokeless tobacco use about 32 years ago. He reports that he does not currently use alcohol. He reports that he does not use drugs.FAMILY HISTORY:family history includes Heart Disease in his father; Valvular heart disease in his maternal cousin, maternal grandmother, and maternal uncle.REVIEW OF SYSTEMS:  A 10+ prior history of systems was obtained.  Pertinent positives and negatives per HPI.  All other systems negative.PHYSICAL EXAM:BP 122/78 (BP Location: Right arm, Patient Position: Sitting, Cuff Size: large adult)   Pulse 64   Resp 12   Ht 1.88 m (6' 2)   Wt 116.2 kg (256 lb 3.2 oz)   SpO2 98%   BMI 32.89 kg/m . GEN: Pleasant, well-appearing, no acute distress. HEENT: Sclerae clear, mucous membranes moist. LUNGS: clear to ausculation bilaterally, no wheezes or crackles. Complete CV exam performed, details include: Regular rate and rhythm, normal S1 and S2, no S3 or S4, no murmur, no rub. JVP not elevated. No carotid bruits. No abd bruits. Ext:  no edema, intact pedal pulses bilaterally. GI: abd soft, nontender, nondistended, normal bowel sounds, no masses or organomegally. NEURO/PSYCH: Normal affect. No gross focal deficits.EKG: From 09/14/2024 shows sinus rhythm at 62 bpm with normal axis and normal intervals.  No Q waves or significant ST-T wave abnormalities are  present. EXERCISE STRESS ECHO COMPLETE 02/15/2022Interpretation Summary The left ventricular ejection fraction is normal. The calculated LVEF is 65%. There are no focal regional wall motion abnormalities. Mild left atrial enlargement. No significant valve disease. Sinus rhythm with no ischemic ECG changes with stress. Negative stress echocardiogram, no evidence of ischemia.LABS/DATA:   Lab results: 09/17/251451 05/28/250504 05/27/250510 Sodium 138 140 140 Potassium 3.9 3.4 3.7 Chloride 103 105 107 CO2 23 23 20  UN 15 11 13  Creatinine 1.11 0.84 0.85 Glucose 237* 133* 153* Calcium 9.6 8.9 9.4   Lab results: 09/15/250842 05/28/250504 05/27/250510 WBC 6.0 6.7 8.4 Hemoglobin 14.8 13.3 13.8 Hematocrit 41 39 40 RBC 4.8 4.6 4.7 Platelets 171 197 198   Lab results: 12/11/241414 Cholesterol 118 HDL 49 Triglycerides 46 LDL Calculated 60   Lab results: 09/15/250843 NT-pro BNP <50 IMPRESSION/PLAN:1.  Palpitations, chest pain, dyspnea.  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  Emergency department evaluation was reassuring with no arrhythmia or evidence of MI on biomarkers.  Blood pressure was quite elevated in the 200 systolic range but improved.  I suspect this was situational.  An arrhythmia certainly possible cause of his symptoms, however his watch did not alarm for a heart rhythm issue.  An episode of sinus tachycardia related to the stress is also possible.  I recommended a 2-week E patch to screen for arrhythmia.  He had a stress echo in 2023 which showed a structurally normal heart and no evidence of myocardial ischemia.  My suspicion that his recent event was due to underlying coronary disease is low so I have not recommended a repeat ischemic evaluation.Thank you for the opportunity to participate in the care of this very pleasant  patient.  We will plan for followup after the upcoming tests have been completed. [1] Past Medical History:Diagnosis Date  Arthritis   knees, hands  Colon polyp   Complication of anesthesia   slow to awaken  Diabetes mellitus   Hypertension   Palpitations  [2] Past Surgical History:Procedure Laterality Date  COLONOSCOPY    JOINT REPLACEMENT    KNEE SURGERY Bilateral   reconstruction; several surgeries  PR TYMPANOSTOMY GENERAL ANESTHESIA Bilateral 02/21/2024  Procedure: INSERTION, TYMPANOSTOMY TUBE, BILATERAL;  Surgeon: Kaza, Srinivas, MD;  Location: [facility] MAIN OR;  Service: ENT  TONSILLECTOMY   [3] AllergiesAllergen Reactions  Lisinopril  Cough  Victoza  [Liraglutide ] Other (See Comments)   GI upset  Environmental Allergies Other (See Comments)   Sneezing

## 2024-09-23 ENCOUNTER — Ambulatory Visit: Payer: Self-pay | Admitting: Cardiology

## 2024-09-23 LAB — METANEPHRINES,PLASMA
Metanephrine,Pl: <0.2 nmol/L (ref <0.50)
Normetanephrine,Pl: 0.4 nmol/L (ref <0.90)

## 2024-10-01 ENCOUNTER — Other Ambulatory Visit: Payer: Self-pay | Admitting: Primary Care

## 2024-10-01 NOTE — Telephone Encounter (Signed)
 Last office visit: 9/16/2025Last telehome visit: Visit date not foundPatients upcoming appointments:Future Appointments Date Time Provider Department Center 10/26/2024 10:20 AM Hortense Cancer, MD [facility] 12/08/2024  3:30 PM Michaele Cough, MD PAN None BP Readings from Last 3 Encounters: 09/18/24 122/78 09/15/24 158/78 09/14/24 169/78  Recent Lab results:GENERAL CHEMISTRY Recent Labs   09/17/251451 05/28/250504 05/27/250510 NA 138 140 140 K 3.9 3.4 3.7 CL 103 105 107 CO2 23 23 20  GAP 12 12 13  UN 15 11 13  CREAT 1.11 0.84 0.85 GLU 237* 133* 153* CA 9.6 8.9 9.4  LIPID PROFILE Recent Labs   12/11/241414 CHOL 118 TRIG 46 HDL 49 LDLC 60  LIVER PROFILE Recent Labs   09/17/251451 05/25/251027 12/11/241414 ALT 40 19 34 AST 29 17 29  ALK 84 77 89 TB 0.8 1.0 0.8  DIABETES THYROID  Recent Labs   05/28/250504 05/26/250606 12/11/241414 HA1C 6.4* 6.3* 6.5*  Recent Labs   09/15/250843 05/26/250606 12/11/241414 TSH 1.85 2.25 2.34   Pending/Orders Labs:Lab Frequency Next Occurrence PSA (eff.03-2009) Once 11/28/2024 Lipid Panel (Reflex to Direct  LDL if Triglycerides more than 400) Once 11/28/2024 Hemoglobin A1c Once 11/28/2024 CBC Once 11/28/2024 Comprehensive metabolic panel Once 11/28/2024 TSH Once 11/28/2024 Vitamin D  Once 11/28/2024 Microalbumin, Urine, Random Once 11/28/2024

## 2024-10-02 ENCOUNTER — Other Ambulatory Visit: Payer: Self-pay | Admitting: Primary Care

## 2024-10-02 MED ORDER — EMPAGLIFLOZIN 25 MG PO TABS *I*: 25.0000 mg | ORAL_TABLET | Freq: Every morning | ORAL | 1 refills | Status: DC

## 2024-10-02 MED ORDER — HUMALOG KWIKPEN 100 UNIT/ML SC SOPN
PEN_INJECTOR | SUBCUTANEOUS | 1 refills | Status: AC
Start: 2024-10-02 — End: ?

## 2024-10-02 NOTE — Telephone Encounter (Signed)
 LOV: 9/16/25NOV: 12/9/25Labs: 9/17/25Confirmed [facility]

## 2024-10-11 ENCOUNTER — Other Ambulatory Visit: Payer: Self-pay | Admitting: Primary Care

## 2024-10-12 NOTE — Telephone Encounter (Signed)
 Last office visit: 9/16/2025Last telehome visit: 10/3/2025Patients upcoming appointments:Future Appointments Date Time Provider Department Center 10/26/2024 10:20 AM Hortense Cancer, MD [facility] 12/08/2024  3:30 PM Michaele Cough, MD PAN None BP Readings from Last 3 Encounters: 09/18/24 122/78 09/15/24 158/78 09/14/24 169/78  Recent Lab results:GENERAL CHEMISTRY Recent Labs   09/17/251451 05/28/250504 05/27/250510 NA 138 140 140 K 3.9 3.4 3.7 CL 103 105 107 CO2 23 23 20  GAP 12 12 13  UN 15 11 13  CREAT 1.11 0.84 0.85 GLU 237* 133* 153* CA 9.6 8.9 9.4  LIPID PROFILE Recent Labs   12/11/241414 CHOL 118 TRIG 46 HDL 49 LDLC 60  LIVER PROFILE Recent Labs   09/17/251451 05/25/251027 12/11/241414 ALT 40 19 34 AST 29 17 29  ALK 84 77 89 TB 0.8 1.0 0.8  DIABETES THYROID  Recent Labs   05/28/250504 05/26/250606 12/11/241414 HA1C 6.4* 6.3* 6.5*  Recent Labs   09/15/250843 05/26/250606 12/11/241414 TSH 1.85 2.25 2.34   Pending/Orders Labs:Lab Frequency Next Occurrence PSA (eff.03-2009) Once 11/28/2024 Lipid Panel (Reflex to Direct  LDL if Triglycerides more than 400) Once 11/28/2024 Hemoglobin A1c Once 11/28/2024 CBC Once 11/28/2024 Comprehensive metabolic panel Once 11/28/2024 TSH Once 11/28/2024 Vitamin D  Once 11/28/2024 Microalbumin, Urine, Random Once 11/28/2024

## 2024-10-19 ENCOUNTER — Other Ambulatory Visit: Payer: Self-pay | Admitting: Primary Care

## 2024-10-19 NOTE — Telephone Encounter (Signed)
 Last office visit: 9/16/2025Last telehome visit: 10/12/2025Patients upcoming appointments:Future Appointments Date Time Provider Department Center 10/26/2024 10:20 AM Hortense Cancer, MD [facility] 12/08/2024  3:30 PM Michaele Cough, MD PAN None BP Readings from Last 3 Encounters: 09/18/24 122/78 09/15/24 158/78 09/14/24 169/78  Recent Lab results:GENERAL CHEMISTRY Recent Labs   09/17/251451 05/28/250504 05/27/250510 NA 138 140 140 K 3.9 3.4 3.7 CL 103 105 107 CO2 23 23 20  GAP 12 12 13  UN 15 11 13  CREAT 1.11 0.84 0.85 GLU 237* 133* 153* CA 9.6 8.9 9.4  LIPID PROFILE Recent Labs   12/11/241414 CHOL 118 TRIG 46 HDL 49 LDLC 60  LIVER PROFILE Recent Labs   09/17/251451 05/25/251027 12/11/241414 ALT 40 19 34 AST 29 17 29  ALK 84 77 89 TB 0.8 1.0 0.8  DIABETES THYROID  Recent Labs   05/28/250504 05/26/250606 12/11/241414 HA1C 6.4* 6.3* 6.5*  Recent Labs   09/15/250843 05/26/250606 12/11/241414 TSH 1.85 2.25 2.34   Pending/Orders Labs:Lab Frequency Next Occurrence PSA (eff.03-2009) Once 11/28/2024 Lipid Panel (Reflex to Direct  LDL if Triglycerides more than 400) Once 11/28/2024 Hemoglobin A1c Once 11/28/2024 CBC Once 11/28/2024 Comprehensive metabolic panel Once 11/28/2024 TSH Once 11/28/2024 Vitamin D  Once 11/28/2024 Microalbumin, Urine, Random Once 11/28/2024

## 2024-10-20 ENCOUNTER — Telehealth: Payer: Self-pay

## 2024-10-20 NOTE — Telephone Encounter (Signed)
 Pt is scheduled for Monday 10/27 for 14day Epatch follow up visit. Could we please check on the results or if pt has sent it in. Thank you.

## 2024-10-20 NOTE — Telephone Encounter (Signed)
 Call to patient who states sent it out via [employer] the Saturday after he took it off 10/03/24.  Attempted to call [place] unable to get through will try again.

## 2024-10-20 NOTE — Telephone Encounter (Signed)
 [place] states have not received will check with their contact distribution to see if lost in transit.

## 2024-10-21 NOTE — Telephone Encounter (Signed)
 Spoke to patient in regards to epatch monitor. Patient states that his wife claims to have returned the monitor to [employer]. It appears that the package's return tracking is coming back from [employer] as invalid. This indicated the monitor was returned to the [place]  [employer] in error. Patient will confirm with his spouse today.

## 2024-10-21 NOTE — Telephone Encounter (Signed)
 Pt called back confirming his spouse put the monitor in to [employer] on the Monday after pt was done wearing it which looks to be 10/6.

## 2024-10-22 NOTE — Telephone Encounter (Signed)
 So what would we like to do with this appointment?

## 2024-10-23 ENCOUNTER — Other Ambulatory Visit: Payer: Self-pay | Admitting: Primary Care

## 2024-10-23 DIAGNOSIS — E119 Type 2 diabetes mellitus without complications: Secondary | ICD-10-CM

## 2024-10-23 NOTE — Telephone Encounter (Signed)
 09-15-24 and next apt 12-08-24.  CMP 9/25 and ordered.

## 2024-10-23 NOTE — Telephone Encounter (Signed)
 Thank you :)

## 2024-10-25 ENCOUNTER — Other Ambulatory Visit: Payer: Self-pay

## 2024-10-26 ENCOUNTER — Ambulatory Visit: Payer: Self-pay | Admitting: Cardiology

## 2024-10-26 ENCOUNTER — Encounter: Payer: Self-pay | Admitting: Cardiology

## 2024-10-26 VITALS — BP 146/82 | HR 67 | Resp 16 | Ht 74.0 in | Wt 264.0 lb

## 2024-10-26 DIAGNOSIS — R002 Palpitations: Secondary | ICD-10-CM

## 2024-10-26 NOTE — Progress Notes (Signed)
 CARDIOLOGY FOLLOWUP I had the pleasure of seeing Firas Guardado in followup for his palpitations, chest pain, and dyspnea.  He has not had further severe symptoms like he did prior to our last visit.  He does note continued palpitations where he will feel his heart beating more rapidly than usual.  He has not had syncope or presyncope.  No chest pain.  He does have some occasional dyspnea.  No edema.Allergies, PMFS, & ROS were reviewed and updated. Pertinent details as above. MEDICATIONS: Current Outpatient Medications Medication Sig  insulin  pen needle (DROPLET PEN NEEDLES) 31G X 6 MM USE 1 PEN NEEDLE THREE TIMES DAILY AS DIRECTED DX: E11.9  metoprolol  tartrate (LOPRESSOR ) 25 mg tablet TAKE 1 TABLET BY MOUTH TWO TIMES DAILY AND MAY TAKE AN ADDITIONAL 1 TABLET ONCE PER DAY AS NEEDED FOR PALPITATIONS  OZEMPIC , 1 MG/DOSE, 4 MG/3ML pen INJECT 1MG  SUBCUTANEOUSLY (UNDER THE SKIN) ONCE EVERY WEEK  insulin  lispro (HUMALOG  KWIKPEN) 100 UNIT/ML injection pen inject 10 units with breakfast then 15 units with LUNCH and 15 units with dinner  empagliflozin  (JARDIANCE ) 25 mg tablet Take 1 tablet (25 mg total) by mouth every morning.  losartan  (COZAAR ) 25 mg tablet Take 50 mg in the morning and 25 mg in the evening.  Continuous Glucose Sensor (DEXCOM G7 SENSOR) use as directed , CHANGE EVERY 10 DAYS  metFORMIN  (GLUCOPHAGE -XR) 500 mg 24 hr tablet take 1 tablets by mouth every morning and 2 tablet every evening  fluticasone  (FLONASE ) 50 MCG/ACT nasal spray Spray 2 sprays into each nostril daily.  Continuous Blood Gluc Receiver (DEXCOM G7 RECEIVER) DEVI By 1 each no specified route continuously  naproxen sodium (ANAPROX) 220 MG tablet Take 1 tablet (220 mg total) by mouth 2 times daily (with meals). Pt takes as needed (Patient not taking: Reported on 09/18/2024) No current facility-administered medications for this visit. PHYSICAL EXAM:BP 146/82 (BP Location: Left arm, Patient Position:  Sitting, Cuff Size: large adult)   Pulse 67   Resp 16   Ht 1.88 m (6' 2)   Wt 119.7 kg (264 lb)   SpO2 98%   BMI 33.90 kg/m . GEN: Pleasant, well-appearing, no acute distress. HEENT: Sclerae clear, mucous membranes moist. LUNGS: clear to ausculation bilaterally, no wheezes or crackles. CV: Regular rate and rhythm, normal S1 and S2, no S3 or S4, no murmur, no rub. JVP not elevated. No carotid bruits.  Ext: no edema, intact pedal pulses bilaterally.  LABS/DATA:   Lab results: 09/17/251451 05/28/250504 05/27/250510 Sodium 138 140 140 Potassium 3.9 3.4 3.7 Chloride 103 105 107 CO2 23 23 20  UN 15 11 13  Creatinine 1.11 0.84 0.85 Glucose 237* 133* 153* Calcium 9.6 8.9 9.4   Lab results: 09/15/250842 05/28/250504 05/27/250510 WBC 6.0 6.7 8.4 Hemoglobin 14.8 13.3 13.8 Hematocrit 41 39 40 RBC 4.8 4.6 4.7 Platelets 171 197 198   Lab results: 12/11/241414 Cholesterol 118 HDL 49 Triglycerides 46 LDL Calculated 60   Lab results: 09/15/250843 NT-pro BNP <50  EXERCISE STRESS ECHO COMPLETE 02/15/2022Interpretation Summary The left ventricular ejection fraction is normal. The calculated LVEF is 65%. There are no focal regional wall motion abnormalities. Mild left atrial enlargement. No significant valve disease. Sinus rhythm with no ischemic ECG changes with stress. Negative stress echocardiogram, no evidence of ischemia.E patch monitor 09/18/2024  7-day cardiac rhythm monitor  Baseline rhythm is normal sinus with an average rate of 70 bpm  Sinus bradycardia present with low rate of 49 bpm occurring at 1:09 PM.  No high-grade AV block or prolonged  pauses.  Rare PVCs.  No VT/NSVT.  Rare low-grade atrial ectopy including brief atrial runs lasting up to 7 beats.  No sustained atrial arrhythmia.  1 patient reported event without specific symptoms listed correlates with sinus rhythm at 120  bpmIMPRESSION/PLAN:1.  Palpitations.  Patient had a severe episode of palpitations during a stressful time at work.  He has had some other less intense episodes since then.  His recent E patch monitor does not show any sustained arrhythmia.  He did have 1 episode of symptoms correlating with sinus rhythm at 120 bpm.  Today we discussed that the findings are reassuring and there is no clear arrhythmia present.  We discussed that sinus tachycardia is not typically treated with nodal blocker therapy.  I suspect with the episode at work was from the stress of the situation.  If he does have further severe symptoms we can consider repeat heart rhythm monitoring but I do not think it is necessary currently.2.  Chest pain, dyspnea.  Nonexertional.  Symptoms improved compared to prior.  Stress echo in 2022 showed structurally normal heart and no evidence of myocardial ischemia.  I do not think repeat ischemic evaluation is necessary currently.3.  Hypertension.  Mildly elevated today in the office.  If readings are consistently over 130-140 in the future the losartan  dose can be increased.We will plan for follow-up in 1 year.

## 2024-11-28 ENCOUNTER — Other Ambulatory Visit: Payer: Self-pay | Admitting: Primary Care

## 2024-11-28 DIAGNOSIS — E119 Type 2 diabetes mellitus without complications: Secondary | ICD-10-CM

## 2024-11-29 ENCOUNTER — Ambulatory Visit: Attending: Urgent Care | Admitting: Urgent Care

## 2024-11-29 ENCOUNTER — Encounter: Payer: Self-pay | Admitting: Urgent Care

## 2024-11-29 ENCOUNTER — Other Ambulatory Visit: Payer: Self-pay

## 2024-11-29 VITALS — BP 164/81 | HR 69 | Temp 97.3°F | Resp 16

## 2024-11-29 DIAGNOSIS — H109 Unspecified conjunctivitis: Secondary | ICD-10-CM | POA: Insufficient documentation

## 2024-11-29 DIAGNOSIS — B349 Viral infection, unspecified: Secondary | ICD-10-CM | POA: Insufficient documentation

## 2024-11-29 MED ORDER — TOBRAMYCIN SULFATE 0.3 % OP SOLN *I*: 1.0000 [drp] | Freq: Four times a day (QID) | OPHTHALMIC | 0 refills | Status: AC

## 2024-11-29 NOTE — [facility] Provider Note (Signed)
 History Chief Complaint Patient presents with . Eye Problem   Right eye is watery and itchy. Had creamy drainage this morning. Symptoms started yesterday. Does have a headache as well that starts around his eye. . Otalgia   2-3 days right ear started having crackling sound. Ear tubes placed this year. Takes Zyrtec daily. No fevers. Very pleasant 58 year old gentleman presents for evaluation of watery itchy right eye.  This morning there was slightly purulent discoloration to the drainage.  Onset began yesterday.  Patient does have a slight headache.  He has had right sided ear discomfort for few days with crackling sounds.  Patient had PE tubes placed this year due to chronic problems with his ears.  He uses Zyrtec daily.  He is a designer, industrial/product.  He states he has been resting for the past few days.  Patient has a history of diabetes but states it is well-controlled.  He is unaware of any diagnosis of glaucoma.  Patient denies any fevers or bodyaches.  He has not used any over-the-counter medications for the eye.  He denies any blurred vision.  Visual acuity here is within normal.  He has not noticed any drainage from the ears.  He does have a home COVID test but has not takenMedical/Surgical/Family History Past Medical History[1] Patient Active Problem List Diagnosis Code . Essential hypertension I10 . Mixed hyperlipidemia E78.2 . Strain of Achilles tendon, right, initial encounter S86.011A . 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 Type 2 diabetes mellitus E11.9 . CAP (community acquired pneumonia) J18.9 . SOB (shortness of breath) R06.02  Past Surgical History[2]Family History[3] Social History[4]Living Situation   Questions Responses  Patient lives with Significant Other  Homeless No  Caregiver for  other family member   External Services   Employment Employed  Domestic Violence Risk     Review of Systems Review of Systems All other systems reviewed and are negative.Physical Exam Vitals   First Recorded BP: 164/81, Resp: 16, Temp: 36.3 C (97.3 F), Temp src: TEMPORAL Oxygen Therapy SpO2: 98 %, Heart Rate: 69, (11/29/24 1026)  .Physical ExamVitals and nursing note reviewed. Constitutional:     General: He is not in acute distress.   Appearance: Normal appearance.    Comments: Patient is awake and alert.  He is slightly flat affect.  He is in no acute distress.  Slight elevated systolic blood pressure noted HENT:    Ears:    Comments: PE tubes visualized and appear to be in good position.  No drainage noted   Nose: Nose normal.    Mouth/Throat:    Mouth: Mucous membranes are moist.    Pharynx: No oropharyngeal exudate or posterior oropharyngeal erythema. Eyes:    Comments: There is mostly clear drainage from the right lateral canthal area of the eye.  Mild injection of the conjunctivo-.  EOMs are intact.  No photophobia.  No evidence of lesion with the everted eyelids.  Funduscopic exam not attempted Cardiovascular:    Rate and Rhythm: Normal rate and regular rhythm.    Heart sounds: Normal heart sounds. Pulmonary:    Effort: No respiratory distress.    Breath sounds: Normal breath sounds.  Medical Decision Making Medical Decision MakingAssessment:  Mild conjunctivitis but concern of more generalized viral illness.  Mildly elevated systolic blood pressurePlan and Results:  Findings reviewed with patient and spouse.  Antibiotic eyedrops will be ordered as prophylaxis given more  purulent nature of drainage.  Follow-up recommended if not improving.  Did not address elevated blood pressure at this visit.  Patient may do home COVID testing but illness is more in line with a mild parainfluenza type illness.  Declines  COVID, influenza or RSV testing here today.Final Diagnosis  ICD-10-CM ICD-9-CM 1. Conjunctivitis, unspecified conjunctivitis type, unspecified laterality  H10.9 372.30 2. Viral illness  B34.9 079.99 DEVERE CHAIN, MDAuthor:  DEVERE CHAIN, MD [1]Past Medical History:Diagnosis Date . Arthritis   knees, hands . Colon polyp  . Complication of anesthesia   slow to awaken . Diabetes mellitus  . Hypertension  . Palpitations  [2]Past Surgical History:Procedure Laterality Date . COLONOSCOPY   . JOINT REPLACEMENT   . KNEE SURGERY Bilateral   reconstruction; several surgeries . PR TYMPANOSTOMY GENERAL ANESTHESIA Bilateral 02/21/2024  Procedure: INSERTION, TYMPANOSTOMY TUBE, BILATERAL;  Surgeon: Kaza, Srinivas, MD;  Location: [facility] MAIN OR;  Service: ENT . TONSILLECTOMY   [3]Family HistoryProblem Relation Name Age of Onset . Heart Disease Father   . Valvular heart disease Maternal Uncle   . Valvular heart disease Maternal Grandmother   . Valvular heart disease Maternal cousin   [4]Social HistoryTobacco Use . Smoking status: Former   Current packs/day: 0.00   Average packs/day: 1 pack/day for 10.0 years (10.0 ttl pk-yrs)   Types: Cigarettes   Start date: 33   Quit date: 1992   Years since quitting: 33.9 . Smokeless tobacco: Former   Quit date: 01/01/1992 Substance Use Topics . Alcohol use: Not Currently   Comment:  monthly . Drug use: No

## 2024-11-29 NOTE — Patient Instructions (Signed)
 Activity as tolerated.  I do believe this is a mild viral generalized illness.  Prophylactic antibiotic eyedrops have been ordered as precaution.  Follow package directions for use.  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

## 2024-11-30 NOTE — Telephone Encounter (Signed)
 Last office visit: 9/16/2025Last telehome visit: Visit date not foundPatients upcoming appointments:Future Appointments Date Time Provider Department Center 12/08/2024  3:30 PM Michaele Cough, MD [facility] None BP Readings from Last 3 Encounters: 11/29/24 164/81 10/26/24 146/82 09/18/24 122/78  Recent Lab results:GENERAL CHEMISTRY Recent Labs   09/17/251451 05/28/250504 05/27/250510 NA 138 140 140 K 3.9 3.4 3.7 CL 103 105 107 CO2 23 23 20  GAP 12 12 13  UN 15 11 13  CREAT 1.11 0.84 0.85 GLU 237* 133* 153* CA 9.6 8.9 9.4  LIPID PROFILE Recent Labs   12/11/241414 CHOL 118 TRIG 46 HDL 49 LDLC 60  LIVER PROFILE Recent Labs   09/17/251451 05/25/251027 12/11/241414 ALT 40 19 34 AST 29 17 29  ALK 84 77 89 TB 0.8 1.0 0.8  DIABETES THYROID  Recent Labs   05/28/250504 05/26/250606 12/11/241414 HA1C 6.4* 6.3* 6.5*  Recent Labs   09/15/250843 05/26/250606 12/11/241414 TSH 1.85 2.25 2.34   Pending/Orders Labs:Lab Frequency Next Occurrence PSA (eff.03-2009) Once 11/28/2024 Lipid Panel (Reflex to Direct  LDL if Triglycerides more than 400) Once 11/28/2024 Hemoglobin A1c Once 11/28/2024 CBC Once 11/28/2024 Comprehensive metabolic panel Once 11/28/2024 TSH Once 11/28/2024 Vitamin D  Once 11/28/2024 Microalbumin, Urine, Random Once 11/28/2024

## 2024-12-08 ENCOUNTER — Other Ambulatory Visit: Payer: Self-pay

## 2024-12-08 ENCOUNTER — Ambulatory Visit: Payer: Self-pay | Admitting: Primary Care

## 2024-12-08 VITALS — BP 168/90 | HR 72 | Temp 97.4°F | Resp 16 | Ht 74.0 in | Wt 270.7 lb

## 2024-12-08 DIAGNOSIS — I10 Essential (primary) hypertension: Secondary | ICD-10-CM

## 2024-12-08 DIAGNOSIS — E119 Type 2 diabetes mellitus without complications: Secondary | ICD-10-CM

## 2024-12-08 DIAGNOSIS — Z125 Encounter for screening for malignant neoplasm of prostate: Secondary | ICD-10-CM

## 2024-12-08 MED ORDER — IRBESARTAN 300 MG PO TABS *I*: 300.0000 mg | ORAL_TABLET | Freq: Every day | ORAL | 1 refills | Status: AC

## 2024-12-08 NOTE — Progress Notes (Signed)
 [facility] Reason For Visit: Chief Complaint Patient presents with  Follow-up   HTN Subjective:  Noah Craig is a 58 y.o. man with a history of HTN, HLD, DM2, presenting for follow up.  Issues discussed below.HTN:Has noticed high BPs at home as well.  Taking metoprolol  tartrate 25 mg BID, losartan  75 mg total per day.  Fortunately his palpitations are almost nonexistent.  DM:A couple of times per week will notice diarrhea.  Going on for the past couple of weeks.  Will need to change jardiance  over to farxiga at next refill (insurance coverage).  Over the past 90 days he's had readings into the 200s.  AM BGs are in the 110-120 range over the last couple of days.   No shaking/sweating/known hypoglycemic episodes on his recent therapy.  Ozempic  is 1 mg weekly, jardiance  25 mg daily, metformin  1500 mg total per day, lispro 10 units with breakfast and 15 units with lunch and dinner.  Due to update labs.  Home Medications: Prior to Admission medications Medication Sig Start Date End Date Taking? Authorizing Provider metFORMIN  (GLUCOPHAGE -XR) 500 mg 24 hr tablet TAKE 1 TABLET BY MOUTH EVERY MORNING AND TAKE 2 TABLETS BY MOUTH EVERY EVENING 11/30/24  Yes Michaele Cough, MD Continuous Glucose Sensor (DEXCOM G7 SENSOR) USE AS DIRECTED CHANGE EVERY 10 DAYS 11/30/24  Yes Michaele Cough, MD insulin  pen needle (DROPLET PEN NEEDLES) 31G X 6 MM USE 1 PEN NEEDLE THREE TIMES DAILY AS DIRECTED DX: E11.9 10/23/24  Yes Michaele Cough, MD metoprolol  tartrate (LOPRESSOR ) 25 mg tablet TAKE 1 TABLET BY MOUTH TWO TIMES DAILY AND MAY TAKE AN ADDITIONAL 1 TABLET ONCE PER DAY AS NEEDED FOR PALPITATIONS 10/20/24  Yes Rojelio Husband, NP OZEMPIC , 1 MG/DOSE, 4 MG/3ML pen INJECT 1MG  SUBCUTANEOUSLY (UNDER THE SKIN) ONCE EVERY WEEK 10/12/24  Yes Michaele Cough, MD insulin  lispro (HUMALOG  KWIKPEN) 100 UNIT/ML injection pen inject 10 units  with breakfast then 15 units with LUNCH and 15 units with dinner 10/02/24  Yes Michaele Cough, MD empagliflozin  (JARDIANCE ) 25 mg tablet Take 1 tablet (25 mg total) by mouth every morning. 10/02/24  Yes Michaele Cough, MD losartan  (COZAAR ) 25 mg tablet Take 50 mg in the morning and 25 mg in the evening. 07/27/24  Yes Michaele Cough, MD fluticasone  (FLONASE ) 50 MCG/ACT nasal spray Spray 2 sprays into each nostril daily.   Yes [provider] Continuous Blood Gluc Receiver (DEXCOM G7 RECEIVER) DEVI By 1 each no specified route continuously 02/19/22  Yes Mickie Leita HERO, PA naproxen sodium (ANAPROX) 220 MG tablet Take 1 tablet (220 mg total) by mouth 2 times daily (with meals). Pt takes as needed   Yes [provider] Allergies: Allergies[1]Review of Systems: As detailed above by problem. Physical Exam:Temp Readings from Last 3 Encounters: 12/08/24 36.3 C (97.4 F) (Temporal) 11/29/24 36.3 C (97.3 F) (Temporal) 09/15/24 36.7 C (98.1 F) (Temporal) BP Readings from Last 3 Encounters: 12/08/24 168/90 11/29/24 164/81 10/26/24 146/82 Pulse Readings from Last 3 Encounters: 12/08/24 72 11/29/24 69 10/26/24 67   Vitals:  12/08/24 1542 BP: 168/90 BP Location: Right arm Patient Position: Sitting Cuff Size: large adult Pulse: 72 Resp: 16 Temp: 36.3 C (97.4 F) TempSrc: Temporal SpO2: 97% Weight: 122.8 kg (270 lb 11.2 oz) Height: 1.88 m (6' 2) Wt Readings from Last 3 Encounters: 12/08/24 122.8 kg (270 lb 11.2 oz) 10/26/24 119.7 kg (264 lb) 09/18/24 116.2 kg (256 lb 3.2 oz) General: Well-developed male, in no acute distress.Lungs: Breathing comfortably at rest.  Cardiovascular: Extremities warm and dry to touch. Extremities: Diabetic foot exam: Readily palpable DP and PT pulses bilaterally.  Skin intact without ulceration. 10/10 monofilament sites tested intact bilaterally.  Broken left great  toenail; cuticle appears healthy.  Neurologic: Fully interactive and cooperative with exam.  Able to provide an accurate history.  Speech and mood appropriate.  ASSESSMENT and Plan: 58 y.o. male with a history of HTN, HLD, DM2, presenting for follow up.  Plan by problem below.1. Essential hypertension - Above goal - change losartan  over to irbesartan  300 mg daily for longer duration of effect and higher dose therapy.  Continue metoprolol  at current dose.  Update BMP in about 2 weeks with blood work to monitor K, renal function.  2. Type 2 diabetes mellitus - Update labs as ordered.  Form for bus driving filled out.  When new year comes around, change jardiance  to farxiga due to insurance coverage.  Continue ozempic  1 mg weekly, lispro 10 units breakfast, 15 units lunch and dinner, metformin  1500 mg total per day.  Depending on updated A1c, can increase dose of ozempic .  Donnice Judge, MD 12/08/2024 3:53 PM [1] AllergiesAllergen Reactions  Lisinopril  Cough  Victoza  Carly.carder ] Other (See Comments)   GI upset  Environmental Allergies Other (See Comments)   Sneezing

## 2024-12-08 NOTE — Patient Instructions (Signed)
 Stop the losartan .In its place, start taking irbesartan  300 mg once per day.Get blood work updated in about 2 weeks.After the first of the year, call so we can send in a script for farxiga instead of the jardiance .

## 2024-12-09 ENCOUNTER — Encounter: Payer: Self-pay | Admitting: Primary Care

## 2024-12-13 ENCOUNTER — Other Ambulatory Visit: Payer: Self-pay | Admitting: Primary Care

## 2024-12-14 MED ORDER — METOPROLOL TARTRATE 25 MG PO TABS *I*: ORAL_TABLET | ORAL | 0 refills | Status: DC

## 2024-12-14 NOTE — Telephone Encounter (Signed)
 Last office visit: 12/9/2025Last telehome visit: Visit date not foundPatients upcoming appointments:Future Appointments Date Time Provider Department Center 06/30/2025 11:00 AM Michaele Cough, MD [facility] None BP Readings from Last 3 Encounters: 12/08/24 168/90 11/29/24 164/81 10/26/24 146/82  Recent Lab results:GENERAL CHEMISTRY Recent Labs   09/17/251451 05/28/250504 05/27/250510 NA 138 140 140 K 3.9 3.4 3.7 CL 103 105 107 CO2 23 23 20  GAP 12 12 13  UN 15 11 13  CREAT 1.11 0.84 0.85 GLU 237* 133* 153* CA 9.6 8.9 9.4  LIPID PROFILE No value within the past 365 days LIVER PROFILE Recent Labs   09/17/251451 05/25/251027 ALT 40 19 AST 29 17 ALK 84 77 TB 0.8 1.0  DIABETES THYROID  Recent Labs   05/28/250504 05/26/250606 HA1C 6.4* 6.3*  Recent Labs   09/15/250843 05/26/250606 TSH 1.85 2.25   Pending/Orders Labs:Lab Frequency Next Occurrence PSA (eff.03-2009) Once 11/28/2024 Lipid Panel (Reflex to Direct  LDL if Triglycerides more than 400) Once 11/28/2024 Hemoglobin A1c Once 11/28/2024 CBC Once 11/28/2024 Comprehensive metabolic panel Once 11/28/2024 TSH Once 11/28/2024 Vitamin D  Once 11/28/2024 Microalbumin, Urine, Random Once 11/28/2024 Microalbumin, Urine, Random Once 06/06/2025 Vitamin D  Once 06/06/2025 TSH Once 06/06/2025 Comprehensive metabolic panel Once 06/06/2025 CBC Once 06/06/2025 Hemoglobin A1c Once 06/06/2025 Lipid Panel (Reflex to Direct  LDL if Triglycerides more than 400) Once 06/06/2025 PSA (eff.03-2009) Once 12/03/2025

## 2024-12-30 ENCOUNTER — Other Ambulatory Visit: Payer: Self-pay | Admitting: Primary Care

## 2024-12-30 NOTE — Telephone Encounter (Signed)
 Last seen 12-08-24 and next apt 06-30-25. CMP, A1c, 9/25.   lipids 12/24.CMP, A1c, Lipids and urine microalbumin ordered.

## 2025-01-02 ENCOUNTER — Other Ambulatory Visit: Payer: Self-pay | Admitting: Primary Care

## 2025-01-04 NOTE — Telephone Encounter (Signed)
 Last office visit: 12/9/2025Last telehome visit: Visit date not foundPatients upcoming appointments:Future Appointments Date Time Provider Department Center 06/30/2025 11:00 AM Michaele Cough, MD [facility] None BP Readings from Last 3 Encounters: 12/08/24 168/90 11/29/24 164/81 10/26/24 146/82  Recent Lab results:GENERAL CHEMISTRY Recent Labs   09/17/251451 05/28/250504 05/27/250510 NA 138 140 140 K 3.9 3.4 3.7 CL 103 105 107 CO2 23 23 20  GAP 12 12 13  UN 15 11 13  CREAT 1.11 0.84 0.85 GLU 237* 133* 153* CA 9.6 8.9 9.4  LIPID PROFILE No value within the past 365 days LIVER PROFILE Recent Labs   09/17/251451 05/25/251027 ALT 40 19 AST 29 17 ALK 84 77 TB 0.8 1.0  DIABETES THYROID  Recent Labs   05/28/250504 05/26/250606 HA1C 6.4* 6.3*  Recent Labs   09/15/250843 05/26/250606 TSH 1.85 2.25   Pending/Orders Labs:Lab Frequency Next Occurrence PSA (eff.03-2009) Once 11/28/2024 Lipid Panel (Reflex to Direct  LDL if Triglycerides more than 400) Once 11/28/2024 Hemoglobin A1c Once 11/28/2024 CBC Once 11/28/2024 Comprehensive metabolic panel Once 11/28/2024 TSH Once 11/28/2024 Vitamin D  Once 11/28/2024 Microalbumin, Urine, Random Once 11/28/2024 Microalbumin, Urine, Random Once 06/06/2025 Vitamin D  Once 06/06/2025 TSH Once 06/06/2025 Comprehensive metabolic panel Once 06/06/2025 CBC Once 06/06/2025 Hemoglobin A1c Once 06/06/2025 Lipid Panel (Reflex to Direct  LDL if Triglycerides more than 400) Once 06/06/2025 PSA (eff.03-2009) Once 12/03/2025

## 2025-01-06 ENCOUNTER — Telehealth: Payer: Self-pay | Admitting: Primary Care

## 2025-01-06 MED ORDER — DAPAGLIFLOZIN PROPANEDIOL 10 MG PO TABS *I*: 10.0000 mg | ORAL_TABLET | Freq: Every morning | ORAL | 1 refills | Status: AC

## 2025-01-06 NOTE — Telephone Encounter (Signed)
 Rx sent for farxiga .  Can stop the jardiance  and start the farxiga  same day, no taper needed.Dr. Michaele

## 2025-01-06 NOTE — Telephone Encounter (Signed)
 He is asking for the Farxiga  now. He is changing from Jardiance  over to Farxiga  per the 12/08/2024 office note. Due to insurance coverage. [employer] [place]

## 2025-01-06 NOTE — Telephone Encounter (Signed)
 Advised ptRx sent for farxiga .  Can stop the jardiance  and start the farxiga  same day, no taper needed.

## 2025-01-22 ENCOUNTER — Other Ambulatory Visit: Payer: Self-pay | Admitting: Primary Care

## 2025-01-22 LAB — HM DIABETES EYE EXAM

## 2025-01-22 NOTE — Telephone Encounter (Signed)
 Last appointment: 12/08/2024 Next appointment: 7/1/2026Labs done 09/16/2024

## 2025-01-26 ENCOUNTER — Encounter: Payer: Self-pay | Admitting: Primary Care

## 2025-02-04 ENCOUNTER — Other Ambulatory Visit: Admission: RE | Admit: 2025-02-04 | Source: Ambulatory Visit

## 2025-02-04 DIAGNOSIS — Z125 Encounter for screening for malignant neoplasm of prostate: Secondary | ICD-10-CM

## 2025-02-04 DIAGNOSIS — Z794 Long term (current) use of insulin: Secondary | ICD-10-CM

## 2025-02-04 LAB — COMPREHENSIVE METABOLIC PANEL
ALT: 42 U/L (ref 0–50)
AST: 31 U/L (ref 0–50)
Albumin: 4.1 g/dL (ref 3.5–5.2)
Alk Phos: 124 U/L (ref 40–130)
Anion Gap: 10 (ref 7–16)
Bilirubin,Total: 0.6 mg/dL (ref 0.0–1.2)
CO2: 25 mmol/L (ref 20–28)
Calcium: 9.4 mg/dL (ref 8.6–10.2)
Chloride: 104 mmol/L (ref 96–108)
Creatinine: 1.09 mg/dL (ref 0.67–1.17)
Glucose: 195 mg/dL — ABNORMAL HIGH (ref 60–99)
Lab: 12 mg/dL (ref 6–20)
Potassium: 4.1 mmol/L (ref 3.3–5.1)
Sodium: 139 mmol/L (ref 133–145)
Total Protein: 6.5 g/dL (ref 6.3–7.7)
eGFR BY CREAT: 79

## 2025-02-04 LAB — LIPID PANEL
Chol/HDL Ratio: 2.6
Cholesterol: 102 mg/dL
HDL: 40 mg/dL (ref 40–60)
LDL Calculated: 33 mg/dL
Non HDL Cholesterol: 62 mg/dL
Triglycerides: 175 mg/dL — AB

## 2025-02-04 LAB — CBC
Hematocrit: 47 % (ref 37–52)
Hemoglobin: 16.1 g/dL (ref 12.0–17.0)
MCV: 89 fL (ref 75–100)
Platelets: 195 10*3/uL (ref 150–450)
RBC: 5.3 MIL/uL (ref 4.0–6.0)
RDW: 13.3 % (ref 0.0–15.0)
WBC: 7.4 10*3/uL (ref 3.5–11.0)

## 2025-02-04 LAB — MICROALBUMIN, URINE, RANDOM
Creatinine,UR: 66 mg/dL (ref 20–300)
Microalb/Creat Ratio: 104.1 mg MA/g CR — ABNORMAL HIGH (ref 0.0–29.9)
Microalbumin,UR: 6.87 mg/dL

## 2025-02-04 LAB — TSH: TSH: 1.54 u[IU]/mL (ref 0.27–4.20)

## 2025-02-04 LAB — PSA (EFF.4-2010): PSA (eff. 4-2010): 1.51 ng/mL (ref 0.00–4.00)

## 2025-02-04 LAB — VITAMIN D: 25-OH Vit D Total: 20 ng/mL — ABNORMAL LOW (ref 30–60)

## 2025-06-30 ENCOUNTER — Ambulatory Visit: Payer: Self-pay | Admitting: Primary Care
